# Patient Record
Sex: Female | Born: 1990 | Race: White | Hispanic: No | Marital: Married | Smoking: Never smoker
Health system: Southern US, Community
[De-identification: ages and names within clinical notes are randomized; demographics above are authoritative.]

## PROBLEM LIST (undated history)

## (undated) ENCOUNTER — Inpatient Hospital Stay: Payer: Self-pay

## (undated) ENCOUNTER — Inpatient Hospital Stay: Admission: RE | Payer: Self-pay | Source: Home / Self Care

## (undated) DIAGNOSIS — F419 Anxiety disorder, unspecified: Secondary | ICD-10-CM

---
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
## 2010-06-29 ENCOUNTER — Emergency Department: Payer: Self-pay | Admitting: Emergency Medicine

## 2010-06-30 ENCOUNTER — Observation Stay: Payer: Self-pay | Admitting: Internal Medicine

## 2010-07-03 ENCOUNTER — Emergency Department (HOSPITAL_COMMUNITY)
Admission: EM | Admit: 2010-07-03 | Discharge: 2010-07-04 | Disposition: A | Discharge status: Home / Self Care | Payer: PRIVATE HEALTH INSURANCE | Attending: Emergency Medicine | Admitting: Emergency Medicine

## 2010-07-03 DIAGNOSIS — R072 Precordial pain: Secondary | ICD-10-CM | POA: Insufficient documentation

## 2010-07-03 DIAGNOSIS — Z79899 Other long term (current) drug therapy: Secondary | ICD-10-CM | POA: Insufficient documentation

## 2010-07-03 DIAGNOSIS — K209 Esophagitis, unspecified without bleeding: Secondary | ICD-10-CM | POA: Insufficient documentation

## 2010-07-04 ENCOUNTER — Encounter (HOSPITAL_COMMUNITY): Payer: Self-pay | Admitting: Radiology

## 2010-07-04 ENCOUNTER — Emergency Department (HOSPITAL_COMMUNITY): Payer: PRIVATE HEALTH INSURANCE

## 2010-07-04 LAB — POCT I-STAT, CHEM 8
Chloride: 100 mEq/L (ref 96–112)
Glucose, Bld: 158 mg/dL — ABNORMAL HIGH (ref 70–99)
Hemoglobin: 11.6 g/dL — ABNORMAL LOW (ref 12.0–15.0)
Potassium: 3.7 mEq/L (ref 3.5–5.1)
Sodium: 137 mEq/L (ref 135–145)
TCO2: 25 mmol/L (ref 0–100)

## 2010-07-04 MED ORDER — IOHEXOL 300 MG/ML  SOLN
100.0000 mL | Freq: Once | INTRAMUSCULAR | Status: AC | PRN
  Administered 2010-07-04: 100 mL via INTRAVENOUS

## 2013-01-19 ENCOUNTER — Ambulatory Visit: Payer: Self-pay | Admitting: General Practice

## 2015-01-29 LAB — OB RESULTS CONSOLE HEPATITIS B SURFACE ANTIGEN: Hepatitis B Surface Ag: NEGATIVE

## 2015-01-29 LAB — OB RESULTS CONSOLE RUBELLA ANTIBODY, IGM: RUBELLA: IMMUNE

## 2015-01-29 LAB — OB RESULTS CONSOLE HIV ANTIBODY (ROUTINE TESTING): HIV: NONREACTIVE

## 2015-01-29 LAB — OB RESULTS CONSOLE VARICELLA ZOSTER ANTIBODY, IGG: VARICELLA IGG: IMMUNE

## 2015-01-29 LAB — OB RESULTS CONSOLE RPR: RPR: NONREACTIVE

## 2015-01-30 DIAGNOSIS — O26893 Other specified pregnancy related conditions, third trimester: Secondary | ICD-10-CM | POA: Insufficient documentation

## 2015-01-30 DIAGNOSIS — Z6791 Unspecified blood type, Rh negative: Secondary | ICD-10-CM | POA: Insufficient documentation

## 2015-02-05 ENCOUNTER — Emergency Department
Admission: EM | Admit: 2015-02-05 | Discharge: 2015-02-06 | Disposition: A | Discharge status: Home / Self Care | Payer: PRIVATE HEALTH INSURANCE | Attending: Emergency Medicine | Admitting: Emergency Medicine

## 2015-02-05 ENCOUNTER — Emergency Department: Payer: PRIVATE HEALTH INSURANCE

## 2015-02-05 DIAGNOSIS — Z3A01 Less than 8 weeks gestation of pregnancy: Secondary | ICD-10-CM | POA: Diagnosis not present

## 2015-02-05 DIAGNOSIS — O200 Threatened abortion: Secondary | ICD-10-CM | POA: Insufficient documentation

## 2015-02-05 DIAGNOSIS — O209 Hemorrhage in early pregnancy, unspecified: Secondary | ICD-10-CM | POA: Diagnosis present

## 2015-02-05 LAB — URINALYSIS COMPLETE WITH MICROSCOPIC (ARMC ONLY)
BACTERIA UA: NONE SEEN
Bilirubin Urine: NEGATIVE
Glucose, UA: NEGATIVE mg/dL
Ketones, ur: NEGATIVE mg/dL
LEUKOCYTES UA: NEGATIVE
NITRITE: NEGATIVE
PH: 5 (ref 5.0–8.0)
PROTEIN: NEGATIVE mg/dL
SPECIFIC GRAVITY, URINE: 1.026 (ref 1.005–1.030)

## 2015-02-05 LAB — CBC
HEMATOCRIT: 39.4 % (ref 35.0–47.0)
HEMOGLOBIN: 13.9 g/dL (ref 12.0–16.0)
MCH: 31.4 pg (ref 26.0–34.0)
MCHC: 35.3 g/dL (ref 32.0–36.0)
MCV: 88.8 fL (ref 80.0–100.0)
Platelets: 227 10*3/uL (ref 150–440)
RBC: 4.43 MIL/uL (ref 3.80–5.20)
RDW: 11.8 % (ref 11.5–14.5)
WBC: 8 10*3/uL (ref 3.6–11.0)

## 2015-02-05 LAB — HCG, QUANTITATIVE, PREGNANCY: HCG, BETA CHAIN, QUANT, S: 20367 m[IU]/mL — AB (ref <5)

## 2015-02-05 LAB — ABO/RH: ABO/RH(D): O NEG

## 2015-02-05 MED ORDER — RHO D IMMUNE GLOBULIN 1500 UNIT/2ML IJ SOSY
300.0000 ug | PREFILLED_SYRINGE | Freq: Once | INTRAMUSCULAR | Status: AC
  Administered 2015-02-06: 300 ug via INTRAMUSCULAR
  Filled 2015-02-05: qty 2

## 2015-02-05 NOTE — ED Notes (Signed)
Pt in with co vaginal bleeding since today, pt is [redacted] weeks pregnant.

## 2015-02-05 NOTE — ED Provider Notes (Signed)
[facility] Emergency Department Provider Note  ____________________________________________  Time seen: 8:20 PM  I have reviewed the triage vital signs and the nursing notes.   HISTORY  Chief Complaint Vaginal Bleeding    HPI Danielle Wood is a 24 y.o. female who is [redacted] weeks pregnant with a positive home pregnancy test who reports pelvic pain and contractions as well as vaginal bleeding today. No chest pain shortness of breath fevers chills nausea vomiting diarrhea or dizziness. She has a first prenatal appointment in 4 weeks for an ultrasound.     No past medical history on file. None  There are no active problems to display for this patient.    No past surgical history on file. None  No current outpatient prescriptions on file. None. Taking prenatal vitamins  Allergies Review of patient's allergies indicates no known allergies.   No family history on file.  Social History Social History  Substance Use Topics  . Smoking status: Not on file  . Smokeless tobacco: Not on file  . Alcohol Use: Not on file   no tobacco alcohol or drug use  Review of Systems  Constitutional:   No fever or chills. No weight changes Eyes:   No blurry vision or double vision.  ENT:   No sore throat. Cardiovascular:   No chest pain. Respiratory:   No dyspnea or cough. Gastrointestinal:   Cramping pelvic pain. No vomiting or diarrhea No BRBPR or melena. Genitourinary:   Negative for dysuria, urinary retention, bloody urine, or difficulty urinating. Positive vaginal bleeding Musculoskeletal:   Negative for back pain. No joint swelling or pain. Skin:   Negative for rash. Neurological:   Negative for headaches, focal weakness or numbness. Psychiatric:  No anxiety or depression.   Endocrine:  No hot/cold intolerance, changes in energy, or sleep difficulty.  10-point ROS otherwise negative.  ____________________________________________   PHYSICAL  EXAM:  VITAL SIGNS: ED Triage Vitals  Enc Vitals Group     BP 02/05/15 1932 125/80 mmHg     Pulse Rate 02/05/15 1932 72     Resp 02/05/15 1932 18     Temp 02/05/15 1932 98.4 F (36.9 C)     Temp Source 02/05/15 1932 Oral     SpO2 02/05/15 1932 100 %     Weight 02/05/15 1932 185 lb (83.915 kg)     Height 02/05/15 1932  (1.753 m)     Head Cir --      Peak Flow --      Pain Score 02/05/15 1933 4     Pain Loc --      Pain Edu? --      Excl. in GC? --      Constitutional:   Alert and oriented. Well appearing and in no distress. Eyes:   No scleral icterus. No conjunctival pallor.  ENT   Head:   Normocephalic and atraumatic.   Gastrointestinal:   Soft and nontender. No distention. There is no CVA tenderness.  No rebound, rigidity, or guarding. Genitourinary:   deferred Musculoskeletal:   Nontender with normal range of motion in all extremities. No joint effusions.  No lower extremity tenderness.  No edema. Neurologic:   Normal speech and language.  CN 2-10 normal. Motor grossly intact. Normal gait. No gross focal neurologic deficits are appreciated.   Psychiatric:   Mood and affect are normal. Speech and behavior are normal. Patient exhibits appropriate insight and judgment.  ____________________________________________    LABS (pertinent positives/negatives) (all labs  ordered are listed, but only abnormal results are displayed) Labs Reviewed  HCG, QUANTITATIVE, PREGNANCY - Abnormal; Notable for the following:    hCG, Beta Chain, [place] Longest [address] (*)    All other components within normal limits  URINALYSIS COMPLETEWITH MICROSCOPIC ([facility] ONLY) - Abnormal; Notable for the following:    Color, Urine YELLOW (*)    APPearance CLEAR (*)    Hgb urine dipstick 3+ (*)    Squamous Epithelial / LPF 6-30 (*)    All other components within normal limits  CBC  FETAL SCREEN  TYPE AND SCREEN  RH IG WORKUP (INCLUDES ABO/RH)  ABO/RH    ____________________________________________   EKG    ____________________________________________    RADIOLOGY  Pelvic ultrasound reveals live IUP with estimated gestational age of [redacted] weeks and 5 days, heart rate of 88. Small subchorionic hemorrhage  ____________________________________________   PROCEDURES   ____________________________________________   INITIAL IMPRESSION / ASSESSMENT AND PLAN / ED COURSE  Pertinent labs & imaging results that were available during my care of the patient were reviewed by me and considered in my medical decision making (see chart for details).  Patient presents with vaginal bleeding in first trimester. Ultrasound reveals a live IUP. Patient counseled on threatened miscarriage and to follow up in 2-3 days with [facility] clinic or the emergency department for repeat blood draw. No other significant symptoms, no evidence of PE or cardiomyopathy or DVT or infection. No bacteriuria. Patient reports that her blood type is O-, so we have a type and screen and fetal blood screen in process. Pending the outcome of this the patient will be given RhoGAM if she is Rh- confirmed, and then discharged home.     ____________________________________________   FINAL CLINICAL IMPRESSION(S) / ED DIAGNOSES  Final diagnoses:  Threatened miscarriage in early pregnancy      Sharman Cheek, MD 02/05/15 2247

## 2015-02-05 NOTE — Discharge Instructions (Signed)
Threatened Miscarriage A threatened miscarriage occurs when you have vaginal bleeding during your first 20 weeks of pregnancy but the pregnancy has not ended. If you have vaginal bleeding during this time, your health care provider will do tests to make sure you are still pregnant. If the tests show you are still pregnant and the developing baby (fetus) inside your womb (uterus) is still growing, your condition is considered a threatened miscarriage. A threatened miscarriage does not mean your pregnancy will end, but it does increase the risk of losing your pregnancy (complete miscarriage). CAUSES  The cause of a threatened miscarriage is usually not known. If you go on to have a complete miscarriage, the most common cause is an abnormal number of chromosomes in the developing baby. Chromosomes are the structures inside cells that hold all your genetic material. Some causes of vaginal bleeding that do not result in miscarriage include:  Having sex.  Having an infection.  Normal hormone changes of pregnancy.  Bleeding that occurs when an egg implants in your uterus. RISK FACTORS Risk factors for bleeding in early pregnancy include:  Obesity.  Smoking.  Drinking excessive amounts of alcohol or caffeine.  Recreational drug use. SIGNS AND SYMPTOMS  Light vaginal bleeding.  Mild abdominal pain or cramps. DIAGNOSIS  If you have bleeding with or without abdominal pain before 20 weeks of pregnancy, your health care provider will do tests to check whether you are still pregnant. One important test involves using sound waves and a computer (ultrasound) to create images of the inside of your uterus. Other tests include an internal exam of your vagina and uterus (pelvic exam) and measurement of your baby's heart rate.  You may be diagnosed with a threatened miscarriage if:  Ultrasound testing shows you are still pregnant.  Your baby's heart rate is strong.  A pelvic exam shows that the  opening between your uterus and your vagina (cervix) is closed.  Your heart rate and blood pressure are stable.  Blood tests confirm you are still pregnant. TREATMENT  No treatments have been shown to prevent a threatened miscarriage from going on to a complete miscarriage. However, the right home care is important.  HOME CARE INSTRUCTIONS   Make sure you keep all your appointments for prenatal care. This is very important.  Get plenty of rest.  Do not have sex or use tampons if you have vaginal bleeding.  Do not douche.  Do not smoke or use recreational drugs.  Do not drink alcohol.  Avoid caffeine. SEEK MEDICAL CARE IF:  You have light vaginal bleeding or spotting while pregnant.  You have abdominal pain or cramping.  You have a fever. SEEK IMMEDIATE MEDICAL CARE IF:  You have heavy vaginal bleeding.  You have blood clots coming from your vagina.  You have severe low back pain or abdominal cramps.  You have fever, chills, and severe abdominal pain. MAKE SURE YOU:  Understand these instructions.  Will watch your condition.  Will get help right away if you are not doing well or get worse. Document Released: 05/04/2005 Document Revised: 05/09/2013 Document Reviewed: 02/28/2013 [facility] Patient Information 2015 [place], [place]. This information is not intended to replace advice given to you by your health care provider. Make sure you discuss any questions you have with your health care provider.  Vaginal Bleeding During Pregnancy, First Trimester A small amount of bleeding (spotting) from the vagina is common in early pregnancy. Sometimes the bleeding is normal and is not a problem, and sometimes  it is a sign of something serious. Be sure to tell your doctor about any bleeding from your vagina right away. HOME CARE  Watch your condition for any changes.  Follow your doctor's instructions about how active you can be.  If you are on bed rest:  You may need to  stay in bed and only get up to use the bathroom.  You may be allowed to do some activities.  If you need help, make plans for someone to help you.  Write down:  The number of pads you use each day.  How often you change pads.  How soaked (saturated) your pads are.  Do not use tampons.  Do not douche.  Do not have sex or orgasms until your doctor says it is okay.  If you pass any tissue from your vagina, save the tissue so you can show it to your doctor.  Only take medicines as told by your doctor.  Do not take aspirin because it can make you bleed.  Keep all follow-up visits as told by your doctor. GET HELP IF:   You bleed from your vagina.  You have cramps.  You have labor pains.  You have a fever that does not go away after you take medicine. GET HELP RIGHT AWAY IF:   You have very bad cramps in your back or belly (abdomen).  You pass large clots or tissue from your vagina.  You bleed more.  You feel light-headed or weak.  You pass out (faint).  You have chills.  You are leaking fluid or have a gush of fluid from your vagina.  You pass out while pooping (having a bowel movement). MAKE SURE YOU:  Understand these instructions.  Will watch your condition.  Will get help right away if you are not doing well or get worse. Document Released: 09/18/2013 Document Reviewed: 01/09/2013 [facility] Patient Information 2015 [place]. This information is not intended to replace advice given to you by your health care provider. Make sure you discuss any questions you have with your health care provider.

## 2015-02-06 DIAGNOSIS — O200 Threatened abortion: Secondary | ICD-10-CM | POA: Diagnosis not present

## 2015-02-06 LAB — FETAL SCREEN: FETAL SCREEN: NEGATIVE

## 2015-02-06 NOTE — ED Notes (Signed)
esignature not working in room.  Pt signed and received instructions.  Family with pt.

## 2015-02-07 LAB — RH IG WORKUP (INCLUDES ABO/RH)
Gestational Age(Wks): 6
Unit division: 0

## 2015-03-02 LAB — TYPE AND SCREEN
ABO/RH(D): O NEG
Antibody Screen: NEGATIVE

## 2015-03-07 ENCOUNTER — Other Ambulatory Visit: Payer: Self-pay | Admitting: Obstetrics and Gynecology

## 2015-03-07 DIAGNOSIS — Z3491 Encounter for supervision of normal pregnancy, unspecified, first trimester: Secondary | ICD-10-CM

## 2015-03-28 ENCOUNTER — Ambulatory Visit
Admission: RE | Admit: 2015-03-28 | Discharge: 2015-03-28 | Disposition: A | Discharge status: Home / Self Care | Payer: PRIVATE HEALTH INSURANCE | Source: Ambulatory Visit | Attending: Obstetrics and Gynecology | Admitting: Obstetrics and Gynecology

## 2015-03-28 ENCOUNTER — Ambulatory Visit (HOSPITAL_BASED_OUTPATIENT_CLINIC_OR_DEPARTMENT_OTHER)
Admission: RE | Admit: 2015-03-28 | Discharge: 2015-03-28 | Disposition: A | Discharge status: Home / Self Care | Payer: PRIVATE HEALTH INSURANCE | Source: Ambulatory Visit | Attending: Obstetrics and Gynecology | Admitting: Obstetrics and Gynecology

## 2015-03-28 DIAGNOSIS — Z3A13 13 weeks gestation of pregnancy: Secondary | ICD-10-CM | POA: Diagnosis not present

## 2015-03-28 DIAGNOSIS — Z3491 Encounter for supervision of normal pregnancy, unspecified, first trimester: Secondary | ICD-10-CM

## 2015-03-28 DIAGNOSIS — Z8279 Family history of other congenital malformations, deformations and chromosomal abnormalities: Secondary | ICD-10-CM

## 2015-03-28 DIAGNOSIS — Z3401 Encounter for supervision of normal first pregnancy, first trimester: Secondary | ICD-10-CM | POA: Diagnosis present

## 2015-03-28 NOTE — Progress Notes (Signed)
Referring physician:  [facility]  Length of Consultation: 45 minutes   Danielle Wood  was referred to [facility] for genetic counseling to review prenatal screening and testing options and to discuss her family history of trisomy 58.  This note summarizes the information we discussed.   We first obtained a detailed family history and pregnancy history.  The patient and her mother were here for the visit.  This is the first pregnancy for Danielle Wood.  She reported no complications or exposures that would be expected to increase the risk for birth defects.  She stated that she has one brother who is in good health.  Her mother also had a son who passed away soon after birth with Trisomy 83.  The remainder of the family history was reported to be unremarkable for birth defects, mental retardation, recurrent pregnancy loss or known chromosome abnormalities.   We reviewed that chromosomes are the inherited structures that contain our instructions for development (genes).  Each cell of our body normally has 46 chromosomes, matched up into 23 pairs.  The last pair determines our gender and are called the sex chromosomes.  A female has an X and a Y chromosome, while a female has two X chromosomes.  Rarely, when a mother's egg and father's sperm unite, an extra or missing chromosome can be passed on to the baby by mistake.  Changes in the number or the structure of the chromosomes may result in a child with some degree of mental retardation and physical problems.  Trisomy 13 is caused by having three copies (instead of the usual two copies) of the genes on chromosome number 13.  There are two ways that trisomy 13 occur.  Most often (>95% of the time), Trisomy 13 is caused by an entire third copy of chromosome 13.  In the other cases, the condition is caused by a rearrangement of the chromosomes, known as a translocation.  We offered to review medical records on her brother to confirm the  chromosomal information.  Ms. Rennie Plowman mother indicated that she recalled it being the type that happened by chance and that she had amniocentesis in her pregnancy with our patient, which was normal.  We would expect if this was reported as normal, then Danielle Wood does not carry a translocation that would put this pregnancy at increased risk for a chromosome condition, but we are happy to review medical records if desired.  If her brother had the trisomy type, then we do not expect this pregnancy to be at increased risk.  We offered the follow screening tests for chromosome conditions in this pregnancy:  First trimester screening, which includes nuchal translucency ultrasound screen and first trimester maternal serum marker screening.  The nuchal translucency has approximately an 80% detection rate for Down syndrome and can be positive for other chromosome abnormalities as well as congenital heart defects.  When combined with a maternal serum marker screening, the detection rate is up to 90% for Down syndrome and up to 97% for trisomy 18.     Maternal serum marker screening, a blood test that measures pregnancy proteins, can provide risk assessments for Down syndrome, trisomy 18, and open neural tube defects (spina bifida, anencephaly). Because it does not directly examine the fetus, it cannot positively diagnose or rule out these problems.  Targeted ultrasound uses high frequency sound waves to create an image of the developing fetus.  An ultrasound is often recommended as a routine means of  evaluating the pregnancy.  It is also used to screen for fetal anatomy problems (for example, a heart defect) that might be suggestive of a chromosomal or other abnormality.   Should these screening tests indicate an increased concern, then the following additional testing options would be offered:  The chorionic villus sampling procedure is available for first trimester chromosome analysis.  This involves the  withdrawal of a small amount of chorionic villi (tissue from the developing placenta).  Risk of pregnancy loss is estimated to be approximately 1 in 200 to 1 in 100 (0.5 to 1%).  There is approximately a 1% (1 in 100) chance that the CVS chromosome results will be unclear.  Chorionic villi cannot be tested for neural tube defects.     Amniocentesis involves the removal of a small amount of amniotic fluid from the sac surrounding the fetus with the use of a thin needle inserted through the maternal abdomen and uterus.  Ultrasound guidance is used throughout the procedure.  Fetal cells from amniotic fluid are directly evaluated and > 99.5% of chromosome problems and > 98% of open neural tube defects can be detected. This procedure is generally performed after the 15th week of pregnancy.  The main risks to this procedure include complications leading to miscarriage in less than 1 in 200 cases (0.5%).  As another option for information if the pregnancy is suspected to be an an increased chance for certain chromosome conditions, we also reviewed the availability of cell free fetal DNA testing from maternal blood to determine whether or not the baby may have either Down syndrome, trisomy 4313, or trisomy 7018.  This test utilizes a maternal blood sample and DNA sequencing technology to isolate circulating cell free fetal DNA from maternal plasma.  The fetal DNA can then be analyzed for DNA sequences that are derived from the three most common chromosomes involved in aneuploidy, chromosomes 13, 18, and 21.  If the overall amount of DNA is greater than the expected level for any of these chromosomes, aneuploidy is suspected.  While we do not consider it a replacement for invasive testing and karyotype analysis, a negative result from this testing would be reassuring, though not a guarantee of a normal chromosome complement for the baby.  An abnormal result is certainly suggestive of an abnormal chromosome complement, though  we would still recommend CVS or amniocentesis to confirm any findings from this testing.  Cystic Fibrosis screening was also discussed with the patient. Cystic fibrosis (CF) is one of the most common genetic conditions in persons of Caucasian ancestry.  This condition occurs in approximately 1 in 2,500 Caucasian persons and results in thickened secretions in the lungs, digestive, and reproductive systems.  For a baby to be at risk for having CF, both of the parents must be carriers for this condition.  Approximately 1 in 6125 Caucasian persons is a carrier for CF.  Current carrier testing looks for the most common mutations in the gene for CF and can detect approximately 90% of carriers in the Caucasian population.  This means that the carrier screening can greatly reduce, but cannot eliminate, the chance for an individual to have a child with CF.  If an individual is found to be a carrier for CF, then carrier testing would be available for the partner. As part of [place] newborn screening profile, all babies born in the [place] of [place] will have a two-tier screening process.  Specimens are first tested to determine the concentration of  immunoreactive trypsinogen (IRT).  The top 5% of specimens with the highest IRT values then undergo DNA testing using a panel of over 40 common CF mutations.   After consideration of the options, Danielle Wood elected to proceed with first trimester screening today and to decline CF carrier screening.  We will contact her with results as soon as they become available.  An ultrasound was performed at the time of the visit.  The gestational age was consistent with  13 weeks.  Fetal anatomy could not be assessed due to early gestational age.  Please refer to the ultrasound report for details of that study.  Danielle Wood was encouraged to call with questions or concerns.  We can be contacted at [phone].   Danielle Anderson, MS, CGC

## 2015-03-28 NOTE — Progress Notes (Signed)
I saw the pt   With the genetic counselor . She had a sibling born with Tri 3413 - she desired first trimester screen  This was done today. Jimmey RalphLivingston, Devyon Keator MD

## 2015-04-01 ENCOUNTER — Telehealth: Payer: Self-pay | Admitting: Obstetrics and Gynecology

## 2015-04-01 NOTE — Telephone Encounter (Signed)
  [name] elected to undergo First Trimester screening on 03/28/2015.  To review, first trimester screening, includes nuchal translucency ultrasound screen and/or first trimester maternal serum marker screening.  The nuchal translucency has approximately an 80% detection rate for Down syndrome and can be positive for other chromosome abnormalities as well as heart defects.  When combined with a maternal serum marker screening, the detection rate is up to 90% for Down syndrome and up to 97% for trisomy 13 and 18.     The results of the First Trimester Nuchal Translucency and Biochemical Screening were within normal range.  The risk for Down syndrome is now estimated to be less than 1 in 10,000.  The risk for Trisomy 13/18 is also estimated to be less than 1 in 10,000.  Should more definitive information be desired, we would offer amniocentesis.  Because we do not yet know the effectiveness of combined first and second trimester screening, we do not recommend a maternal serum screen to assess the chance for chromosome conditions.  However, if screening for neural tube defects is desired, maternal serum screening for AFP only can be performed between 15 and [redacted] weeks gestation.

## 2015-05-19 NOTE — L&D Delivery Note (Signed)
Delivery Note At 12:12 AM a viable female was delivered via Vaginal, Spontaneous Delivery (Presentation: Middle Occiput Anterior).  APGAR: 9, 9; weight 6 lb 10.9 oz (3030 g).   Placenta status: intact, Spontaneous.  Cord: 3 vessels with the following complications: none  Anesthesia: Epidural  Episiotomy: Right Mediolateral Lacerations:  Right periurethral Suture Repair: 2.0 3.0 vicryl Est. Blood Loss (mL): 100  Mom to postpartum.  Baby to Couplet care / Skin to Skin.  Danielle Wood 10/03/2015, 1:10 AM

## 2015-06-06 ENCOUNTER — Observation Stay
Admission: EM | Admit: 2015-06-06 | Discharge: 2015-06-06 | Disposition: A | Discharge status: Home / Self Care | Payer: Managed Care, Other (non HMO) | Attending: Obstetrics and Gynecology | Admitting: Obstetrics and Gynecology

## 2015-06-06 ENCOUNTER — Observation Stay: Payer: Managed Care, Other (non HMO)

## 2015-06-06 DIAGNOSIS — O4692 Antepartum hemorrhage, unspecified, second trimester: Principal | ICD-10-CM | POA: Insufficient documentation

## 2015-06-06 DIAGNOSIS — Z3A23 23 weeks gestation of pregnancy: Secondary | ICD-10-CM | POA: Insufficient documentation

## 2015-06-06 DIAGNOSIS — O4690 Antepartum hemorrhage, unspecified, unspecified trimester: Secondary | ICD-10-CM | POA: Diagnosis present

## 2015-06-06 NOTE — OB Triage Provider Note (Signed)
25 yo G1P0 with LMP of 12/26/14 & EDD of 10/02/15 here today from [facility] for vaginal bleeding x 3 days. Initially started as spotting and today she passed a quarter sized blood clot. She was crying initially at the office as her and the FOB had a fight. He arrived and they were having an argument and he left upset. Patient did not want him with her. She was sent to hospital for monitoring and [place]. Past Medical History  Diagnosis Date  . Medical history non-contributory    Past Surgical History  Procedure Laterality Date  . No past surgeries    No family history on file.  Social History   Social History  . Marital Status: Single    Spouse Name: N/A  . Number of Children: N/A  . Years of Education: N/A   Occupational History  . Not on file.   Social History Main Topics  . Smoking status: Never Smoker   . Smokeless tobacco: Never Used  . Alcohol Use: No  . Drug Use: No  . Sexual Activity: Not Currently   Other Topics Concern  . Not on file   Social History Narrative  Review of Systems -+ vaginal spotting and bleeding, Benign x 9. Gen: 25 you white female in NAD. HEENT: Eyes: non-icteric, PERL, normocephalic.  Lungs: CTA bilat, no W/R/R. Heart: S1S2, RRR, No M/R/G. Abd: Gravid Vag Exam: Ext os 0.5 cm/long/? Presenting part UC: none on monitor FHT: Cat 1, 150. Age appropriate. [place]: no abruption or previa noted. Normal fluid.  VSS, Afebrile A: IUP at 22 weeks 2. Vaginal Bleeding of unknown cause P: Pelvic rest 2. Work restrictions 3. FU next week with [facility]

## 2015-06-06 NOTE — MAU Provider Note (Signed)
TRIAGE VISIT with NST  Vaginal Bleeding x 3 days  Subjective:   Danielle Wood is a 25 y.o. female. She is at [redacted]w[redacted]d gestation. She has noted spotting x 2 days very light and today passed a quarter sized clot and was seen at the office with CJones, CNM and evaluated. Pt was sent here to observe and also monitor and do an [place].  Her pregnancy has been complicated by:2nd trimester bleeding vaginally with no known cause.   PMH, PSH, POBH and problem list reviewed Medications and allergies reviewed.    Objective:   General appearance: alert, well appearing, and in no distress. External fetal monitoring: reactive No results found for this or any previous visit (from the past 48 hour(s)).   Assessment:   Pregnancy at [redacted]w[redacted]d with concerns for decreased fetal movement. Evaluation reveals:reactive NST with 2 accels 15 x 15 BPM   Plan:  NST for decreased FM, Fetus is moving and pt notes the FM now Orders placed: Orders Placed This Encounter  Procedures  . US OB Limited    Standing Status: Standing     Number of Occurrences: 1     Standing Expiration Date:     Order Specific Question:  What location should the exam be performed?    Answer:  [facility]    Order Specific Question:  Symptom/Reason for Exam    Answer:  Vaginal bleeding during pregnancy, antepartum [1610960]  . Diet clear liquid Room service appropriate?: Yes; Fluid consistency:: Thin; Fluid restriction:: 2000 mL Fluid    Standing Status: Standing     Number of Occurrences: 1     Standing Expiration Date:     Order Specific Question:  Room service appropriate?    Answer:  Yes    Order Specific Question:  Fluid consistency:    Answer:  Thin    Order Specific Question:  Fluid restriction:    Answer:  2000 mL Fluid  . Vitals signs per unit policy    Standing Status: Standing     Number of Occurrences: 1     Standing Expiration Date:   . Notify Physician    Standing Status: Standing     Number of Occurrences: 1     Standing Expiration Date:     Order Specific Question:  Notify Physician    Answer:  for vaginal bleeding    Order Specific Question:  Notify Physician    Answer:  for acute abdominal pain    Order Specific Question:  Notify Physician    Answer:  for temperature >/= 100.4 F    Order Specific Question:  Notify Physician    Answer:  for significant change in vital signs    Order Specific Question:  Notify Physician    Answer:  for non-reassuring fetal heart rate pattern    Order Specific Question:  Notify Physician    Answer:  for imminent delivery or failure to progress  . Fetal monitoring per unit policy    Standing Status: Standing     Number of Occurrences: 1     Standing Expiration Date:   . Activity as tolerated    Standing Status: Standing     Number of Occurrences: 1     Standing Expiration Date:   . Full code    Standing Status: Standing     Number of Occurrences: 1     Standing Expiration Date:   . Place in observation (patient's expected length of stay will be less than 2 midnights)  Standing Status: Standing     Number of Occurrences: 1     Standing Expiration Date:     Order Specific Question:  Hospital Area    Answer:  [facility] [[id number]]    Order Specific Question:  Diagnosis    Answer:  Vaginal bleeding during pregnancy, antepartum [1610960]    Order Specific Question:  Level of Care    Answer:  BIRTHING SUITE [17]    Order Specific Question:  Admitting Physician    Answer:  Darrell Jewel    Order Specific Question:  Attending Physician    Answer:  Darrell Jewel    Order Specific Question:  PT Class (Do Not Modify)    Answer:  Observation [104]    Order Specific Question:  PT Acc Code (Do Not Modify)    Answer:  Observation [10022]    Patient expresses understanding of information provided and plan of care.

## 2015-06-06 NOTE — Progress Notes (Signed)
Received report and assumed care from Leim Fabry, RN.  Introduced myself to pt.

## 2015-06-06 NOTE — Discharge Instructions (Signed)
Pelvic Rest °Pelvic rest is sometimes recommended for women when:  °· The placenta is partially or completely covering the opening of the cervix (placenta previa). °· There is bleeding between the uterine wall and the amniotic sac in the first trimester (subchorionic hemorrhage). °· The cervix begins to open without labor starting (incompetent cervix, cervical insufficiency). °· The labor is too early (preterm labor). °HOME CARE INSTRUCTIONS °· Do not have sexual intercourse, stimulation, or an orgasm. °· Do not use tampons, douche, or put anything in the vagina. °· Do not lift anything over 10 pounds (4.5 kg). °· Avoid strenuous activity or straining your pelvic muscles. °SEEK MEDICAL CARE IF:  °· You have any vaginal bleeding during pregnancy. Treat this as a potential emergency. °· You have cramping pain felt low in the stomach (stronger than menstrual cramps). °· You notice vaginal discharge (watery, mucus, or bloody). °· You have a low, dull backache. °· There are regular contractions or uterine tightening. °SEEK IMMEDIATE MEDICAL CARE IF: °You have vaginal bleeding and have placenta previa.  °  °This information is not intended to replace advice given to you by your health care provider. Make sure you discuss any questions you have with your health care provider. °  °Document Released: 08/29/2010 Document Revised: 07/27/2011 Document Reviewed: 11/05/2014 °Elsevier Interactive Patient Education ©2016 [employer] ° °

## 2015-06-06 NOTE — Discharge Summary (Signed)
Patient given discharge instructions on pelvic rest and vaginal bleeding during delivery. Patient given follow up appointment and given work note. Patient ambulatory at discharge with steady gait, in stable condition.

## 2015-06-06 NOTE — Progress Notes (Signed)
Pt up to wheelchair and to ultrasound.

## 2015-06-06 NOTE — Plan of Care (Signed)
G1 P0 pt arrived to [facility] with complaint of spotting x 3 days. Pt has hx of subchorionic hemorrage with early pregnancy. She states she has passed several small clots (quarter size) and bright red to orangish red.  States she has felt small amount of pressure and was told to be on monitor per CNM to see if any uc's seen on tracing.  Pt was checked per CNM in office and was 0.5 outer os and 0 inner os. Blood was seen on CNM glove in office. Ellison Carwin RNC

## 2015-07-15 ENCOUNTER — Observation Stay
Admission: EM | Admit: 2015-07-15 | Discharge: 2015-07-15 | Disposition: A | Discharge status: Home / Self Care | Payer: Managed Care, Other (non HMO) | Attending: Obstetrics and Gynecology | Admitting: Obstetrics and Gynecology

## 2015-07-15 DIAGNOSIS — R112 Nausea with vomiting, unspecified: Secondary | ICD-10-CM | POA: Diagnosis not present

## 2015-07-15 DIAGNOSIS — O26893 Other specified pregnancy related conditions, third trimester: Principal | ICD-10-CM | POA: Insufficient documentation

## 2015-07-15 DIAGNOSIS — Z3A28 28 weeks gestation of pregnancy: Secondary | ICD-10-CM | POA: Diagnosis not present

## 2015-07-15 DIAGNOSIS — R197 Diarrhea, unspecified: Secondary | ICD-10-CM | POA: Diagnosis not present

## 2015-07-15 DIAGNOSIS — R102 Pelvic and perineal pain: Secondary | ICD-10-CM | POA: Insufficient documentation

## 2015-07-15 LAB — URINALYSIS COMPLETE WITH MICROSCOPIC (ARMC ONLY)
BACTERIA UA: NONE SEEN
Bilirubin Urine: NEGATIVE
Glucose, UA: NEGATIVE mg/dL
Nitrite: NEGATIVE
PH: 6 (ref 5.0–8.0)
PROTEIN: 30 mg/dL — AB
Specific Gravity, Urine: 1.024 (ref 1.005–1.030)

## 2015-07-15 LAB — COMPREHENSIVE METABOLIC PANEL
ALBUMIN: 3.3 g/dL — AB (ref 3.5–5.0)
ALT: 17 U/L (ref 14–54)
ANION GAP: 9 (ref 5–15)
AST: 21 U/L (ref 15–41)
Alkaline Phosphatase: 65 U/L (ref 38–126)
BUN: 9 mg/dL (ref 6–20)
CHLORIDE: 106 mmol/L (ref 101–111)
CO2: 22 mmol/L (ref 22–32)
Calcium: 8.7 mg/dL — ABNORMAL LOW (ref 8.9–10.3)
Creatinine, Ser: 0.6 mg/dL (ref 0.44–1.00)
GFR calc Af Amer: >60 mL/min (ref >60)
GFR calc non Af Amer: >60 mL/min (ref >60)
GLUCOSE: 90 mg/dL (ref 65–99)
POTASSIUM: 4 mmol/L (ref 3.5–5.1)
SODIUM: 137 mmol/L (ref 135–145)
Total Bilirubin: 1 mg/dL (ref 0.3–1.2)
Total Protein: 7.1 g/dL (ref 6.5–8.1)

## 2015-07-15 LAB — CBC
HEMATOCRIT: 37.6 % (ref 35.0–47.0)
HEMOGLOBIN: 13.2 g/dL (ref 12.0–16.0)
MCH: 31.5 pg (ref 26.0–34.0)
MCHC: 35.1 g/dL (ref 32.0–36.0)
MCV: 89.8 fL (ref 80.0–100.0)
Platelets: 210 10*3/uL (ref 150–440)
RBC: 4.19 MIL/uL (ref 3.80–5.20)
RDW: 13.1 % (ref 11.5–14.5)
WBC: 10 10*3/uL (ref 3.6–11.0)

## 2015-07-15 LAB — CHLAMYDIA/NGC RT PCR (ARMC ONLY)
Chlamydia Tr: NOT DETECTED
N GONORRHOEAE: NOT DETECTED

## 2015-07-15 MED ORDER — CALCIUM CARBONATE ANTACID 500 MG PO CHEW: 2.0000 | CHEWABLE_TABLET | ORAL | Status: DC | PRN

## 2015-07-15 MED ORDER — LACTATED RINGERS IV BOLUS (SEPSIS)
1000.0000 mL | Freq: Once | INTRAVENOUS | Status: AC
  Administered 2015-07-15: 1000 mL via INTRAVENOUS

## 2015-07-15 MED ORDER — PROMETHAZINE HCL 12.5 MG RE SUPP
12.5000 mg | Freq: Four times a day (QID) | RECTAL | Status: DC | PRN
Start: 2015-07-15 — End: 2015-10-04

## 2015-07-15 MED ORDER — OXYTOCIN 40 UNITS IN LACTATED RINGERS INFUSION - SIMPLE MED: 1.0000 m[IU]/min | INTRAVENOUS | Status: DC

## 2015-07-15 MED ORDER — ACETAMINOPHEN 325 MG PO TABS
650.0000 mg | ORAL_TABLET | ORAL | Status: DC | PRN
  Administered 2015-07-15: 650 mg via ORAL
  Filled 2015-07-15: qty 2

## 2015-07-15 MED ORDER — SODIUM CHLORIDE FLUSH 0.9 % IV SOLN
INTRAVENOUS | Status: AC
  Filled 2015-07-15: qty 20

## 2015-07-15 MED ORDER — ONDANSETRON 4 MG PO TBDP: 4.0000 mg | ORAL_TABLET | Freq: Four times a day (QID) | ORAL | Status: DC | PRN

## 2015-07-15 NOTE — OB Triage Note (Signed)
Patient comes in with complaint of abdominal cramping, nausea, vomiting, and diarrhea since two this afternoon. Patient state she has not been able to keep anything down. Patient denies any vaginal bleeding or discharge. Patient vital sign stable and patient afebrile. Mother at bedside. Will continue to monitor.

## 2015-07-15 NOTE — OB Triage Provider Note (Signed)
Triage visit with NST   Danielle Wood is a 25 y.o. G1P0. She is at [redacted]w[redacted]d gestation. She presents with nausea/vomiting and sharp abdominal pain and pelvic pressure  Indication: Pelvic pressure and pain  S: Resting comfortably. no CTX, no VB. Active fetal movement O:  BP 119/71 mmHg  Pulse 104  Temp(Src) 98.2 F (36.8 C) (Oral)  Resp 19  Ht  (1.753 m)  Wt 83.915 kg (185 lb)  BMI 27.31 kg/m2  LMP 12/26/2014 No results found for this or any previous visit (from the past 48 hour(s)).   Gen: NAD, AAOx3      Abd: FNTTP      Ext: Non-tender, Nonedmeatous    FHT: 155, mod var, +accels no decels - appropriate for gestational age TOCO: quiet SVE: Dilation: Closed Exam by:: Virgel Manifold, MD   A/P:  25 y.o. G1P0 [redacted]w[redacted]d with n/v, likely viral enteritis.    CMP, CBC ordered. 1L bolus iv fluids given. Antiemetics sent to pharmacy  Reactive NST, with moderate variability and accelerations, no decels  Fetal Wellbeing: Reassuring  Cervix closed - no evidence of preterm labor  D/c home stable, precautions reviewed, follow-up as scheduled.

## 2015-09-04 LAB — OB RESULTS CONSOLE GBS: GBS: NEGATIVE

## 2015-09-04 LAB — OB RESULTS CONSOLE GC/CHLAMYDIA
CHLAMYDIA, DNA PROBE: NEGATIVE
Gonorrhea: NEGATIVE

## 2015-10-02 ENCOUNTER — Encounter: Payer: Self-pay | Admitting: Obstetrics and Gynecology

## 2015-10-02 ENCOUNTER — Inpatient Hospital Stay: Payer: Managed Care, Other (non HMO) | Admitting: Anesthesiology

## 2015-10-02 ENCOUNTER — Inpatient Hospital Stay
Admission: RE | Admit: 2015-10-02 | Discharge: 2015-10-04 | DRG: 775 | Disposition: A | Discharge status: Home / Self Care | Payer: Managed Care, Other (non HMO) | Attending: Obstetrics and Gynecology | Admitting: Obstetrics and Gynecology

## 2015-10-02 ENCOUNTER — Other Ambulatory Visit: Payer: Self-pay | Admitting: Obstetrics and Gynecology

## 2015-10-02 DIAGNOSIS — Z8279 Family history of other congenital malformations, deformations and chromosomal abnormalities: Secondary | ICD-10-CM

## 2015-10-02 DIAGNOSIS — Z3A40 40 weeks gestation of pregnancy: Secondary | ICD-10-CM | POA: Diagnosis not present

## 2015-10-02 LAB — TYPE AND SCREEN
ABO/RH(D): O NEG
ANTIBODY SCREEN: NEGATIVE

## 2015-10-02 LAB — CBC
HEMATOCRIT: 33.9 % — AB (ref 35.0–47.0)
HEMATOCRIT: 36.3 % (ref 35.0–47.0)
HEMOGLOBIN: 12 g/dL (ref 12.0–16.0)
HEMOGLOBIN: 12.3 g/dL (ref 12.0–16.0)
MCH: 31.8 pg (ref 26.0–34.0)
MCH: 31.2 pg (ref 26.0–34.0)
MCHC: 35.4 g/dL (ref 32.0–36.0)
MCHC: 34 g/dL (ref 32.0–36.0)
MCV: 90 fL (ref 80.0–100.0)
MCV: 91.8 fL (ref 80.0–100.0)
Platelets: 182 10*3/uL (ref 150–440)
Platelets: 176 10*3/uL (ref 150–440)
RBC: 3.77 MIL/uL — ABNORMAL LOW (ref 3.80–5.20)
RBC: 3.96 MIL/uL (ref 3.80–5.20)
RDW: 12.6 % (ref 11.5–14.5)
RDW: 12.6 % (ref 11.5–14.5)
WBC: 9.3 10*3/uL (ref 3.6–11.0)
WBC: 13 10*3/uL — AB (ref 3.6–11.0)

## 2015-10-02 LAB — COMPREHENSIVE METABOLIC PANEL
ALBUMIN: 2.9 g/dL — AB (ref 3.5–5.0)
ALK PHOS: 95 U/L (ref 38–126)
ALT: 15 U/L (ref 14–54)
ANION GAP: 9 (ref 5–15)
AST: 22 U/L (ref 15–41)
BILIRUBIN TOTAL: 0.5 mg/dL (ref 0.3–1.2)
BUN: 5 mg/dL — ABNORMAL LOW (ref 6–20)
CALCIUM: 8.5 mg/dL — AB (ref 8.9–10.3)
CO2: 21 mmol/L — ABNORMAL LOW (ref 22–32)
Chloride: 106 mmol/L (ref 101–111)
Creatinine, Ser: 0.58 mg/dL (ref 0.44–1.00)
Glucose, Bld: 99 mg/dL (ref 65–99)
POTASSIUM: 3.7 mmol/L (ref 3.5–5.1)
Sodium: 136 mmol/L (ref 135–145)
TOTAL PROTEIN: 6.1 g/dL — AB (ref 6.5–8.1)

## 2015-10-02 MED ORDER — FENTANYL 2.5 MCG/ML W/ROPIVACAINE 0.2% IN NS 100 ML EPIDURAL INFUSION (ARMC-ANES)
EPIDURAL | Status: AC
  Filled 2015-10-02: qty 100

## 2015-10-02 MED ORDER — LACTATED RINGERS IV SOLN
INTRAVENOUS | Status: DC
  Administered 2015-10-02: 11:00:00 via INTRAVENOUS

## 2015-10-02 MED ORDER — TERBUTALINE SULFATE 1 MG/ML IJ SOLN: 0.2500 mg | Freq: Once | INTRAMUSCULAR | Status: DC | PRN

## 2015-10-02 MED ORDER — LIDOCAINE HCL (PF) 1 % IJ SOLN
30.0000 mL | INTRAMUSCULAR | Status: DC | PRN
  Filled 2015-10-02: qty 30

## 2015-10-02 MED ORDER — BUTORPHANOL TARTRATE 1 MG/ML IJ SOLN
1.0000 mg | INTRAMUSCULAR | Status: DC | PRN
  Administered 2015-10-02: 1 mg via INTRAVENOUS
  Filled 2015-10-02: qty 1

## 2015-10-02 MED ORDER — OXYTOCIN 40 UNITS IN LACTATED RINGERS INFUSION - SIMPLE MED: 2.5000 [IU]/h | INTRAVENOUS | Status: DC

## 2015-10-02 MED ORDER — OXYTOCIN BOLUS FROM INFUSION: 500.0000 mL | INTRAVENOUS | Status: DC

## 2015-10-02 MED ORDER — SODIUM CHLORIDE 0.9 % IV SOLN
INTRAVENOUS | Status: DC | PRN
  Administered 2015-10-02 (×2): 5 mL via EPIDURAL

## 2015-10-02 MED ORDER — ACETAMINOPHEN 325 MG PO TABS
650.0000 mg | ORAL_TABLET | ORAL | Status: DC | PRN
Start: 2015-10-02 — End: 2015-10-03

## 2015-10-02 MED ORDER — FENTANYL 2.5 MCG/ML W/ROPIVACAINE 0.2% IN NS 100 ML EPIDURAL INFUSION (ARMC-ANES)
EPIDURAL | Status: AC
  Administered 2015-10-02: 21:00:00 via EPIDURAL
  Administered 2015-10-02: 10 mL/h via EPIDURAL
  Filled 2015-10-02: qty 100

## 2015-10-02 MED ORDER — SODIUM CHLORIDE 0.9 % IJ SOLN
INTRAMUSCULAR | Status: AC
  Filled 2015-10-02: qty 200

## 2015-10-02 MED ORDER — ONDANSETRON HCL 4 MG/2ML IJ SOLN: 4.0000 mg | Freq: Four times a day (QID) | INTRAMUSCULAR | Status: DC | PRN

## 2015-10-02 MED ORDER — LACTATED RINGERS IV SOLN: 500.0000 mL | INTRAVENOUS | Status: DC | PRN

## 2015-10-02 MED ORDER — MISOPROSTOL 200 MCG PO TABS
ORAL_TABLET | ORAL | Status: AC
  Filled 2015-10-02: qty 4

## 2015-10-02 MED ORDER — CITRIC ACID-SODIUM CITRATE 334-500 MG/5ML PO SOLN
30.0000 mL | ORAL | Status: DC | PRN
Start: 2015-10-02 — End: 2015-10-03

## 2015-10-02 MED ORDER — LIDOCAINE-EPINEPHRINE (PF) 1.5 %-1:200000 IJ SOLN
INTRAMUSCULAR | Status: DC | PRN
  Administered 2015-10-02: 3 mL via EPIDURAL

## 2015-10-02 MED ORDER — OXYTOCIN 40 UNITS IN LACTATED RINGERS INFUSION - SIMPLE MED
1.0000 m[IU]/min | INTRAVENOUS | Status: DC
  Administered 2015-10-02: 2 m[IU]/min via INTRAVENOUS
  Administered 2015-10-02: 1 m[IU]/min via INTRAVENOUS
  Filled 2015-10-02: qty 1000

## 2015-10-02 NOTE — Anesthesia Preprocedure Evaluation (Signed)
Anesthesia Evaluation  Patient identified by MRN, date of birth, ID band Patient awake    Reviewed: Allergy & Precautions, H&P , NPO status , Patient's Chart, lab work & pertinent test results, reviewed documented beta blocker date and time   History of Anesthesia Complications Negative for: history of anesthetic complications  Airway Mallampati: II  TM Distance: >3 FB Neck ROM: full    Dental no notable dental hx. (+) Teeth Intact   Pulmonary neg pulmonary ROS,    Pulmonary exam normal breath sounds clear to auscultation       Cardiovascular Exercise Tolerance: Good negative cardio ROS Normal cardiovascular exam Rhythm:regular Rate:Normal     Neuro/Psych negative neurological ROS  negative psych ROS   GI/Hepatic negative GI ROS, Neg liver ROS,   Endo/Other  negative endocrine ROS  Renal/GU negative Renal ROS  negative genitourinary   Musculoskeletal   Abdominal   Peds  Hematology negative hematology ROS (+)   Anesthesia Other Findings Past Medical History:   Medical history non-contributory                             Reproductive/Obstetrics (+) Pregnancy                             Anesthesia Physical Anesthesia Plan  ASA: II  Anesthesia Plan: Epidural   Post-op Pain Management:    Induction:   Airway Management Planned:   Additional Equipment:   Intra-op Plan:   Post-operative Plan:   Informed Consent: I have reviewed the patients History and Physical, chart, labs and discussed the procedure including the risks, benefits and alternatives for the proposed anesthesia with the patient or authorized representative who has indicated his/her understanding and acceptance.   Dental Advisory Given  Plan Discussed with: Anesthesiologist, CRNA and Surgeon  Anesthesia Plan Comments:         Anesthesia Quick Evaluation

## 2015-10-02 NOTE — Progress Notes (Signed)
MD Note:  Fully dilated at 10:18pm, -1 station. Labor down for 1 hour. Rechecked at 11pm and found to be +2/+3 station. ROA.  On O2, IVH, left lateral positioning  Not feeling pressure  O: Filed Vitals:   10/02/15 2054 10/02/15 2154  BP: 142/97 130/85  Pulse: 71 78  Temp: 98.5 F (36.9 C)   Resp: 22     EFM: 150 mod var, +accels with scalp stim, intermittent variable decelerations to nadir of 40 x 1 minute always with spontaneous recovery to baseline   Toco: Q713min  A/P: 24yo G1P0 @ 40+0wks undergoing elective IOL, cat 2 tracing with moderate variability which precludes metabolic acidemia at this time. Will continue pushing. Uterine resuscitation measures in place.  - anticipate NSVD  Ala DachJohanna K Conni Knighton, MD

## 2015-10-02 NOTE — Progress Notes (Signed)
IOL Day 1 - elective  S: Patient unable to get pain relief with epidural. Additional stadol just given. Pitocin shut off at 8:20pm for prolonged deceleration in setting of tachysystole. Still with tachysystole now, pit at rate of 2.   O: Filed Vitals:   10/02/15 2045 10/02/15 2054  BP: 144/95 142/97  Pulse: 78 71  Temp:  98.5 F (36.9 C)  Resp:  22   GEN: mild distress ABD: soft, NT EXT: wwp  SVE: 5.5/60/-3 at 8:20pm, IUPC in place  EFM: 140 mod var, no accels, intermittent variable decelerations to nadir of 60 - cat 2 Toco: tachysystole, but MVU 180  A/P: 24yo G1P0 @ 40+0wks undergoing elective IOL. Also w/ severe range BPs likely due to pain, no neuro sx of PEC. Awaiting labs to be sent. Though MVUs not adequate, we are adjusting her epidural to help with pain relief. Will turn off pitocin for another hour as the level adjusts. Will restart in 1 hour.  1. Severe range BPs - CMP, P:C ratio 2. Pain control - titrate epidural  3. Pitocin augmentation - restart in 1 hour to achieve adequate MVUs - recheck with 4 hours of adequate MVUs 4. Fetal well being - moderate variability precludes metabolic acidemia - cont to monitor  Anticipate NSVD  Ala DachJohanna K Burnice Oestreicher, MD

## 2015-10-02 NOTE — Progress Notes (Signed)
Foley bulb removed. Pitocin at 7mu, down from 11 for tachysystole. Pt still comfortable. Epidural or pain meds when desired.  Cat I strip with mod var, +accels, no decels. Toco- irritability SVE- 5/60/-3, well-applied and cephalic. Still midposition and moderately soft.  A/P: 24yo G1P0 at 40+0wks with elective IOL at term - s/p Foley bulb. Titrated pitocin - Fetal status reassuring with Cat I strip - Plan for AROM when patient finishes walking. - Pain meds PRN - Anticipate vaginal delivery

## 2015-10-02 NOTE — H&P (Signed)
  OB ADMISSION/ HISTORY & PHYSICAL:  Admission Date: No admission date for patient encounter.  Admit Diagnosis: Elective IOL at 40 weeks  Danielle Wood is a 25 y.o. female presenting for elective induction at 40 weeks for advanced dilation.   Prenatal History: G1P0   EDC : 10/02/2015, by Last Menstrual Period  [facility]  Prenatal course complicated by Rh negative, Subchorionic hemorrhage in 1st trimester: [facility] - evaluate at complete anatomy US, Pt. Had an older brother with Down Syndrome that passed away at day 1 of life - genetic screening negative   Prenatal Labs: ABO, Rh:  O NEG  Antibody: NEG (09/20 2153) Rubella:   Immune Varicella: Immune RPR:   NR HBsAg:   Negative HIV:   Negative GTT: 91 GBS:   Negative   Medical / Surgical History :  Past medical history:  Past Medical History  Diagnosis Date  . Medical history non-contributory      Past surgical history:  Past Surgical History  Procedure Laterality Date  . No past surgeries      Family History: No family history on file.   Social History:  reports that she has never smoked. She has never used smokeless tobacco. She reports that she does not drink alcohol or use illicit drugs.   Allergies: Review of patient's allergies indicates no known allergies.    Current Medications at time of admission:  Prior to Admission medications   Medication Sig Start Date End Date Taking? Authorizing Provider  ondansetron (ZOFRAN ODT) 4 MG disintegrating tablet Take 1 tablet (4 mg total) by mouth every 6 (six) hours as needed for nausea. 07/15/15   Christeen DouglasBethany Beasley, MD  Prenatal Vit-Fe Fumarate-FA (PRENATAL MULTIVITAMIN) TABS tablet Take 1 tablet by mouth daily at 12 noon.    Historical Provider, MD  promethazine (PHENERGAN) 12.5 MG suppository Place 1 suppository (12.5 mg total) rectally every 6 (six) hours as needed for nausea or vomiting. 07/15/15   Christeen DouglasBethany Beasley, MD   promethazine (PHENERGAN) 25 MG suppository Place 25 mg rectally every 6 (six) hours as needed for nausea or vomiting.    Historical Provider, MD  promethazine (PHENERGAN) 25 MG tablet Take 25 mg by mouth every 6 (six) hours as needed for nausea or vomiting.    Historical Provider, MD     Review of Systems: Active FM Irregular contractions No LOF  / SROM bloody show present   Physical Exam:  VS: Last menstrual period 12/26/2014.  General: alert and oriented, appears calm Heart: RRR Lungs: Clear lung fields Abdomen: Gravid, soft and non-tender, non-distended / uterus: gravid, non-tender Extremities: +1 LE edema  Genitalia / VE:  3cm/60%/-2/vtx   Assessment: [redacted] weeks gestation Induction stage of labor RH Negative   Plan:  1. Elective IOL at 40 weeks   - Routine Labor and Delivery orders  - Begin Pitocin at 1 milliunit and increase by 2 milliunits   - May have Stadol 1mg  IVP every 1hour PRN pain  - May have epidural upon request 2. GBS Negative  - No prophylaxis needed 3. Contraception:  - OCPs 4. RH Negative   - S/P Rhogam on 02/05/16 and 07/04/15 5. Anticipate NSVD  Dr. Dalbert GarnetBeasley notified of admission / plan of care  Carlean JewsMeredith Sigmon, CNM

## 2015-10-02 NOTE — Progress Notes (Signed)
Pt still breathing and crying with contractions. Pt does have an anesthesia level at T12 but is still crying with contractions. Dr Karlton LemonKarenz called and informed of clinical data, and order recieved to increase epidural rate to 3420ml/hr for 1 hr.

## 2015-10-02 NOTE — Progress Notes (Signed)
Patient ID: Danielle BendersSamantha E Wood, female   DOB: 04/01/1991, 25 y.o.   MRN: [id number]  S: Pt not feeling contractions strongly, but is contracting with pitocin of 17mu. No headache, blurry vision or spots in vision, no epigastric pain.  O: Elevated BP. Max 157/97 at 18:30.   Filed Vitals:   10/02/15 1730 10/02/15 1835  BP: 133/94 152/97  Pulse: 95 86  Temp: 98 F (36.7 C)   Resp:      SVE: 5/60/-3, mid position.  FHT- Cat I strip with mod varin, +accels, no decels Toco- q2 min  A/P: 24yo G1P0 at 40+0wks for elective IOL with cervical dilation of 3cm at admission.  - PreE labs collected and sent. If severe features, will start magnesium with BP parameters of 160/110. - AROM now for clear fluid. IUPC placed. Monitor mvu. Titrate pitocin to adequate. - GBS neg - Epidural when desired. - Anticipate vaginal delivery

## 2015-10-02 NOTE — Progress Notes (Addendum)
Saddie BendersSamantha E Murray is a 25 y.o. G1P0 at 1964w0d admitted for induction of labor due to Elective at term.  Subjective: Feeling some crampiness. Pitocin at 847mu/min.   Objective: BP 143/95 mmHg  Pulse 70  Temp(Src) 98.2 F (36.8 C) (Oral)  Resp 18  Ht 5\' 9"  (1.753 m)  Wt 201 lb (91.173 kg)  BMI 29.67 kg/m2  LMP 12/26/2014      FHT:  FHR: 140 bpm, variability: moderate,  accelerations:  Present,  decelerations:  Absent UC:   irregular, every 10 minutes SVE:   Dilation: 3 Effacement (%): Thick Station: -2 Exam by:: [facility]  Labs: Lab Results  Component Value Date   WBC 9.3 10/02/2015   HGB 12.0 10/02/2015   HCT 33.9* 10/02/2015   MCV 90.0 10/02/2015   PLT 182 10/02/2015    Assessment / Plan: Induction of labor due to elective,  progressing on pitocin. Cook catheter placed with 80mL in each bulb.  Labor: Progressing on Pitocin, will continue to increase then AROM Fetal Wellbeing:  Category I Pain Control:  Labor support without medications Anticipated MOD:  NSVD  Reah Justo 10/02/2015, 1:46 PM

## 2015-10-02 NOTE — Anesthesia Procedure Notes (Signed)
Epidural Patient location during procedure: OB Start time: 10/02/2015 7:58 PM End time: 10/02/2015 8:07 PM  Staffing Anesthesiologist: Lenard SimmerKARENZ, Ramiah Helfrich Performed by: anesthesiologist   Preanesthetic Checklist Completed: patient identified, site marked, surgical consent, pre-op evaluation, timeout performed, IV checked, risks and benefits discussed and monitors and equipment checked  Epidural Patient position: sitting Prep: ChloraPrep Patient monitoring: heart rate, continuous pulse ox and blood pressure Approach: midline Location: L4-L5 Injection technique: LOR saline  Needle:  Needle type: Tuohy  Needle gauge: 17 G Needle length: 9 cm and 9 Needle insertion depth: 5 cm Catheter type: closed end flexible Catheter size: 19 Gauge Catheter at skin depth: 9.5 cm Test dose: negative and 1.5% lidocaine with Epi 1:200 K  Assessment Sensory level: T8 Events: blood not aspirated, injection not painful, no injection resistance, negative IV test and no paresthesia  Additional Notes   Patient tolerated the insertion well without immediate complications.Reason for block:procedure for pain

## 2015-10-02 NOTE — ED Notes (Signed)
Pt is here for a scheduled induction.  

## 2015-10-03 LAB — CBC
HCT: 36 % (ref 35.0–47.0)
Hemoglobin: 12.5 g/dL (ref 12.0–16.0)
MCH: 31.2 pg (ref 26.0–34.0)
MCHC: 34.7 g/dL (ref 32.0–36.0)
MCV: 89.9 fL (ref 80.0–100.0)
PLATELETS: 175 10*3/uL (ref 150–440)
RBC: 4 MIL/uL (ref 3.80–5.20)
RDW: 13.2 % (ref 11.5–14.5)
WBC: 15.2 10*3/uL — AB (ref 3.6–11.0)

## 2015-10-03 LAB — RPR: RPR Ser Ql: NONREACTIVE

## 2015-10-03 MED ORDER — ACETAMINOPHEN 325 MG PO TABS: 650.0000 mg | ORAL_TABLET | ORAL | Status: DC | PRN

## 2015-10-03 MED ORDER — SIMETHICONE 80 MG PO CHEW: 80.0000 mg | CHEWABLE_TABLET | ORAL | Status: DC | PRN

## 2015-10-03 MED ORDER — SODIUM CHLORIDE 0.9% FLUSH: 3.0000 mL | INTRAVENOUS | Status: DC | PRN

## 2015-10-03 MED ORDER — ONDANSETRON HCL 4 MG/2ML IJ SOLN: 4.0000 mg | INTRAMUSCULAR | Status: DC | PRN

## 2015-10-03 MED ORDER — OXYCODONE HCL 5 MG PO TABS
10.0000 mg | ORAL_TABLET | ORAL | Status: DC | PRN
  Administered 2015-10-03 – 2015-10-04 (×4): 10 mg via ORAL
  Filled 2015-10-03 (×4): qty 2

## 2015-10-03 MED ORDER — MEPERIDINE HCL 25 MG/ML IJ SOLN: 6.2500 mg | INTRAMUSCULAR | Status: DC | PRN

## 2015-10-03 MED ORDER — IBUPROFEN 600 MG PO TABS
600.0000 mg | ORAL_TABLET | Freq: Four times a day (QID) | ORAL | Status: DC
  Administered 2015-10-03 – 2015-10-04 (×5): 600 mg via ORAL
  Filled 2015-10-03 (×5): qty 1

## 2015-10-03 MED ORDER — DIPHENHYDRAMINE HCL 50 MG/ML IJ SOLN: 12.5000 mg | INTRAMUSCULAR | Status: DC | PRN

## 2015-10-03 MED ORDER — NALBUPHINE HCL 10 MG/ML IJ SOLN: 5.0000 mg | Freq: Once | INTRAMUSCULAR | Status: DC | PRN

## 2015-10-03 MED ORDER — TETANUS-DIPHTH-ACELL PERTUSSIS 5-2.5-18.5 LF-MCG/0.5 IM SUSP: 0.5000 mL | Freq: Once | INTRAMUSCULAR | Status: DC

## 2015-10-03 MED ORDER — ZOLPIDEM TARTRATE 5 MG PO TABS: 5.0000 mg | ORAL_TABLET | Freq: Every evening | ORAL | Status: DC | PRN

## 2015-10-03 MED ORDER — KETOROLAC TROMETHAMINE 30 MG/ML IJ SOLN: 30.0000 mg | Freq: Four times a day (QID) | INTRAMUSCULAR | Status: DC | PRN

## 2015-10-03 MED ORDER — FENTANYL 2.5 MCG/ML W/ROPIVACAINE 0.2% IN NS 100 ML EPIDURAL INFUSION (ARMC-ANES): 10.0000 mL/h | EPIDURAL | Status: DC

## 2015-10-03 MED ORDER — NALOXONE HCL 2 MG/2ML IJ SOSY: 1.0000 ug/kg/h | PREFILLED_SYRINGE | INTRAVENOUS | Status: DC | PRN

## 2015-10-03 MED ORDER — ONDANSETRON HCL 4 MG/2ML IJ SOLN: 4.0000 mg | Freq: Three times a day (TID) | INTRAMUSCULAR | Status: DC | PRN

## 2015-10-03 MED ORDER — COCONUT OIL OIL
1.0000 "application " | TOPICAL_OIL | Status: DC | PRN
  Administered 2015-10-03: 1 via TOPICAL
  Filled 2015-10-03 (×2): qty 120

## 2015-10-03 MED ORDER — BENZOCAINE-MENTHOL 20-0.5 % EX AERO
1.0000 "application " | INHALATION_SPRAY | CUTANEOUS | Status: DC | PRN
  Administered 2015-10-03: 1 via TOPICAL
  Filled 2015-10-03 (×2): qty 56

## 2015-10-03 MED ORDER — ONDANSETRON HCL 4 MG PO TABS: 4.0000 mg | ORAL_TABLET | ORAL | Status: DC | PRN

## 2015-10-03 MED ORDER — NALBUPHINE HCL 10 MG/ML IJ SOLN: 5.0000 mg | INTRAMUSCULAR | Status: DC | PRN

## 2015-10-03 MED ORDER — OXYCODONE HCL 5 MG PO TABS: 5.0000 mg | ORAL_TABLET | ORAL | Status: DC | PRN

## 2015-10-03 MED ORDER — DIPHENHYDRAMINE HCL 25 MG PO CAPS: 25.0000 mg | ORAL_CAPSULE | Freq: Four times a day (QID) | ORAL | Status: DC | PRN

## 2015-10-03 MED ORDER — NALOXONE HCL 0.4 MG/ML IJ SOLN: 0.4000 mg | INTRAMUSCULAR | Status: DC | PRN

## 2015-10-03 MED ORDER — DIBUCAINE 1 % RE OINT: 1.0000 "application " | TOPICAL_OINTMENT | RECTAL | Status: DC | PRN

## 2015-10-03 MED ORDER — SODIUM CHLORIDE 0.9% FLUSH: 3.0000 mL | Freq: Two times a day (BID) | INTRAVENOUS | Status: DC

## 2015-10-03 MED ORDER — PRENATAL MULTIVITAMIN CH
1.0000 | ORAL_TABLET | Freq: Every day | ORAL | Status: DC
  Administered 2015-10-03 – 2015-10-04 (×2): 1 via ORAL
  Filled 2015-10-03 (×2): qty 1

## 2015-10-03 MED ORDER — WITCH HAZEL-GLYCERIN EX PADS
1.0000 | MEDICATED_PAD | CUTANEOUS | Status: DC | PRN
Start: 2015-10-03 — End: 2015-10-04

## 2015-10-03 MED ORDER — SODIUM CHLORIDE 0.9 % IV SOLN: 250.0000 mL | INTRAVENOUS | Status: DC | PRN

## 2015-10-03 MED ORDER — DIPHENHYDRAMINE HCL 25 MG PO CAPS: 25.0000 mg | ORAL_CAPSULE | ORAL | Status: DC | PRN

## 2015-10-03 MED ORDER — SENNOSIDES-DOCUSATE SODIUM 8.6-50 MG PO TABS
2.0000 | ORAL_TABLET | ORAL | Status: DC
  Administered 2015-10-03: 2 via ORAL
  Filled 2015-10-03: qty 2

## 2015-10-03 NOTE — Progress Notes (Signed)
Post Partum Day Del day Subjective:   Objective: Blood pressure 126/76, pulse 87, temperature 98.2 F (36.8 C), temperature source Oral, resp. rate 18, height 5\' 9"  (1.753 m), weight 91.173 kg (201 lb), last menstrual period 12/26/2014, SpO2 98 %, unknown if currently breastfeeding.  Physical Exam:  General:25 yo white female in NAD, A,A,O x 3 Heart: S1S2, RRR, No M/R/G. Lungs: CTA bilat, no W/R/R/ Abd: U-3, FF.  Lochia: mod, no clots Voiding well, taking po well Perineum: no hematoma, healing well DVT Evaluation: Neg Homans   Recent Labs  10/02/15 2215 10/03/15 0438  HGB 12.3 12.5  HCT 36.3 36.0    Assessment/Plan: A: PPD#1 Stable NSVD with repair  P: Pt may opt to go home in am  LOS: 1 day   Sharee PimpleCaron W Maelys Kinnick 10/03/2015, 9:06 AM

## 2015-10-03 NOTE — Lactation Note (Signed)
.  Lactation Consultation Note  Patient Name: Saddie BendersSamantha E Murray RUEAV'WToday's Date: 10/03/2015 Reason for consult: Initial assessment   Maternal Data   Basic breast feeding reviewed.  Feeding Feeding Type: Breast Fed  LATCH Score/Interventions                      Lactation Tools Discussed/Used     Consult Status      Trudee GripCarolyn P Adithi Gammon 10/03/2015, 11:16 AM

## 2015-10-04 MED ORDER — OXYCODONE HCL 5 MG PO TABS: 5.0000 mg | ORAL_TABLET | ORAL | Status: DC | PRN

## 2015-10-04 MED ORDER — SENNOSIDES-DOCUSATE SODIUM 8.6-50 MG PO TABS: 2.0000 | ORAL_TABLET | ORAL | Status: DC

## 2015-10-04 MED ORDER — IBUPROFEN 600 MG PO TABS
600.0000 mg | ORAL_TABLET | Freq: Four times a day (QID) | ORAL | Status: DC
  Administered 2015-10-04: 600 mg via ORAL
  Filled 2015-10-04: qty 1

## 2015-10-04 NOTE — Anesthesia Postprocedure Evaluation (Signed)
Anesthesia Post Note  Patient: Danielle Wood  Procedure(s) Performed: * No procedures listed *  Patient location during evaluation: Mother Baby Anesthesia Type: Epidural Level of consciousness: awake and alert Pain management: pain level controlled Vital Signs Assessment: post-procedure vital signs reviewed and stable Respiratory status: spontaneous breathing, nonlabored ventilation and respiratory function stable Cardiovascular status: stable Postop Assessment: no headache, no backache and epidural receding Anesthetic complications: no    Last Vitals:  Filed Vitals:   10/04/15 0057 10/04/15 0723  BP: 112/53 120/73  Pulse: 83 74  Temp: 37.1 C 36.4 C  Resp: 20 18    Last Pain:  Filed Vitals:   10/04/15 0724  PainSc: 2                  Danielle Wood,  Danielle Wood

## 2015-10-04 NOTE — Progress Notes (Signed)
Pt discharged home with infant.  Discharge instructions and follow up appointment given to and reviewed with pt.  Pt verbalized understanding.  Escorted by auxillary. 

## 2015-10-04 NOTE — Discharge Summary (Signed)
OB Discharge Summary     Patient Name: Danielle Wood DOB: 1991-02-25 MRN: [id number]  Date of admission: 10/02/2015 Delivering MD: Burnett CorrenteHALFON, Jahron Hunsinger K   Date of discharge: 10/04/2015  Admitting diagnosis: DELIVERY Intrauterine pregnancy: 7865w1d     Secondary diagnosis:  Active Problems:   Labor and delivery, indication for care  Additional problems: elective IOL     Discharge diagnosis: Term Pregnancy Delivered                                                                                                Post partum procedures:none  Augmentation: AROM and Pitocin  Complications: None  Hospital course:  Induction of Labor With Vaginal Delivery   25 y.o. yo G1P1001 at 6865w1d was admitted to the hospital 10/02/2015 for induction of labor.  Indication for induction: Favorable cervix at term.  Patient had an uncomplicated labor course as follows: Membrane Rupture Time/Date: 7:03 PM ,10/02/2015   Intrapartum Procedures: Episiotomy: Right Mediolateral [4]                                         Lacerations:     Patient had delivery of a Viable infant.  Information for the patient's newborn:  Theron AristaMurray, Girl Sarah [[id number]][[id number]]  Delivery Method: Vag-Spont   10/03/2015  Details of delivery can be found in separate delivery note.  Patient had a routine postpartum course. Patient is discharged home 10/04/2015.   Physical exam  Filed Vitals:   10/04/15 0048 10/04/15 0057 10/04/15 0723 10/04/15 1223  BP: 126/78 112/53 120/73   Pulse: 73 83 74   Temp: 98.4 F (36.9 C) 98.7 F (37.1 C) 97.6 F (36.4 C) 98 F (36.7 C)  TempSrc: Oral Oral Oral Oral  Resp: 18 20 18    Height:      Weight:      SpO2: 95% 100% 100%    General: alert, cooperative and no distress Lochia: appropriate Uterine Fundus: firm Incision: Healing well with no significant drainage DVT Evaluation: No evidence of DVT seen on physical exam. Labs: Lab Results  Component Value Date   WBC 15.2* 10/03/2015   HGB  12.5 10/03/2015   HCT 36.0 10/03/2015   MCV 89.9 10/03/2015   PLT 175 10/03/2015   CMP Latest Ref Rng 10/02/2015  Glucose 65 - 99 mg/dL 99  BUN 6 - 20 mg/dL 5(L)  Creatinine 7.820.44 - 1.00 mg/dL 9.560.58  Sodium 213135 - 086145 mmol/L 136  Potassium 3.5 - 5.1 mmol/L 3.7  Chloride 101 - 111 mmol/L 106  CO2 22 - 32 mmol/L 21(L)  Calcium 8.9 - 10.3 mg/dL 5.7(Q8.5(L)  Total Protein 6.5 - 8.1 g/dL 6.1(L)  Total Bilirubin 0.3 - 1.2 mg/dL 0.5  Alkaline Phos 38 - 126 U/L 95  AST 15 - 41 U/L 22  ALT 14 - 54 U/L 15    Discharge instruction: per After Visit Summary and "Baby and Me Booklet".  After visit meds:    Medication List    STOP taking these  medications        ondansetron 4 MG disintegrating tablet  Commonly known as:  ZOFRAN ODT     promethazine 12.5 MG suppository  Commonly known as:  PHENERGAN     promethazine 25 MG suppository  Commonly known as:  PHENERGAN     promethazine 25 MG tablet  Commonly known as:  PHENERGAN      TAKE these medications        oxyCODONE 5 MG immediate release tablet  Commonly known as:  Oxy IR/ROXICODONE  Take 1 tablet (5 mg total) by mouth every 4 (four) hours as needed (pain scale 4-7).     prenatal multivitamin Tabs tablet  Take 1 tablet by mouth daily at 12 noon.     senna-docusate 8.6-50 MG tablet  Commonly known as:  Senokot-S  Take 2 tablets by mouth daily.        Diet: routine diet  Activity: Advance as tolerated. Pelvic rest for 6 weeks.   Outpatient follow up:2 weeks Follow up Appt:No future appointments. Follow up Visit:No Follow-up on file.  Postpartum contraception: undecided  Newborn Data: Live born female  Birth Weight: 6 lb 10.9 oz (3030 g) APGAR: 9, 9  Baby Feeding: Bottle and Breast Disposition:home with mother   10/04/2015 Ala Dach, MD

## 2015-11-25 ENCOUNTER — Ambulatory Visit: Payer: Self-pay | Admitting: Physician Assistant

## 2015-11-25 VITALS — BP 110/80 | HR 98 | Temp 98.8°F

## 2015-11-25 DIAGNOSIS — J0100 Acute maxillary sinusitis, unspecified: Secondary | ICD-10-CM

## 2015-11-25 NOTE — Progress Notes (Signed)
S: cough x 1 week with fever of 101.  Right facial pain.  OTC without relief.  Non-smoker O: TMS dull, nose boggy, r max sinus tender, throat mod injection, neck s w/o aden.  Lungs cl bilat H rrr A: right max sinusitis

## 2015-12-24 ENCOUNTER — Encounter: Payer: Self-pay | Admitting: Physician Assistant

## 2015-12-24 ENCOUNTER — Ambulatory Visit: Payer: Self-pay | Admitting: Physician Assistant

## 2015-12-24 VITALS — BP 119/75 | HR 87 | Temp 98.7°F

## 2015-12-24 DIAGNOSIS — L049 Acute lymphadenitis, unspecified: Secondary | ICD-10-CM

## 2015-12-24 DIAGNOSIS — J029 Acute pharyngitis, unspecified: Secondary | ICD-10-CM

## 2015-12-24 MED ORDER — METHYLPREDNISOLONE 4 MG PO TBPK: ORAL_TABLET | ORAL | 0 refills | Status: DC

## 2015-12-24 NOTE — Progress Notes (Signed)
S: c/o continued sore throat and swollen glands on left side, no fever/chills, finished amoxil over 1.5 weeks ago, still hurts to swallow, painful at back of her tongue, no cough, does get a small amount of green mucus out  O: vitals wnl, nad, tms clear, nasal mucosa a little boggy, throat wnl, neck supple with anterior cervical lymph on left side only, lungs c t a, cv rrr  A: lymphadenitis, sore throat  P: cbc mono, medrol dose pack, will refer to ent;

## 2015-12-25 LAB — CBC WITH DIFFERENTIAL/PLATELET
BASOS: 0 %
Basophils Absolute: 0 10*3/uL (ref 0.0–0.2)
EOS (ABSOLUTE): 0.1 10*3/uL (ref 0.0–0.4)
EOS: 2 %
HEMATOCRIT: 37.4 % (ref 34.0–46.6)
HEMOGLOBIN: 12.5 g/dL (ref 11.1–15.9)
Immature Grans (Abs): 0 10*3/uL (ref 0.0–0.1)
Immature Granulocytes: 0 %
LYMPHS ABS: 2.6 10*3/uL (ref 0.7–3.1)
Lymphs: 31 %
MCH: 30 pg (ref 26.6–33.0)
MCHC: 33.4 g/dL (ref 31.5–35.7)
MCV: 90 fL (ref 79–97)
MONOCYTES: 6 %
Monocytes Absolute: 0.5 10*3/uL (ref 0.1–0.9)
NEUTROS ABS: 5.1 10*3/uL (ref 1.4–7.0)
Neutrophils: 61 %
Platelets: 303 10*3/uL (ref 150–379)
RBC: 4.16 x10E6/uL (ref 3.77–5.28)
RDW: 13.6 % (ref 12.3–15.4)
WBC: 8.3 10*3/uL (ref 3.4–10.8)

## 2015-12-25 LAB — MONO QUAL W/RFLX QN: Mono Qual W/Rflx Qn: NEGATIVE

## 2016-01-07 NOTE — Progress Notes (Signed)
Patient ID: Danielle Wood, female   DOB: 12-11-1990, 25 y.o.   MRN: [id number] Patient has been referred to see Dr. Jenne CampusMcQueen at [facility] on 01/16/16 @ 1:45pm.  I left a message on the patient's voicemail informing her about her appointment.

## 2016-01-10 ENCOUNTER — Encounter
Admission: RE | Admit: 2016-01-10 | Discharge: 2016-01-10 | Disposition: A | Discharge status: Home / Self Care | Payer: PRIVATE HEALTH INSURANCE | Source: Ambulatory Visit | Attending: Unknown Physician Specialty | Admitting: Unknown Physician Specialty

## 2016-01-10 HISTORY — DX: Anxiety disorder, unspecified: F41.9

## 2016-01-10 NOTE — Patient Instructions (Signed)
  Your procedure is scheduled BJ:YNWGNFAon:Tuesday August 29 , 2017. Report to Same Day Surgery. To find out your arrival time please call [phone] between 1PM - 3PM on Monday January 13, 2016.  Remember: Instructions that are not followed completely may result in serious medical risk, up to and including death, or upon the discretion of your surgeon and anesthesiologist your surgery may need to be rescheduled.    _x___ 1. Do not eat food or drink liquids after midnight. No gum chewing or  hard candies.     _x___ 2. No Alcohol for 24 hours before or after surgery.   ____ 3. Bring all medications with you on the day of surgery if instructed.    __x__ 4. Notify your doctor if there is any change in your medical condition     (cold, fever, infections).     Do not wear jewelry, make-up, hairpins, clips or nail polish.  Do not wear lotions, powders, or perfumes. You may wear deodorant.  Do not shave 48 hours prior to surgery. Men may shave face and neck.  Do not bring valuables to the hospital.    [facility] not responsible for any belongings or valuables.               Contacts, dentures or bridgework may not be worn into surgery.  Leave your suitcase in the car. After surgery it may be brought to your room.  For patients admitted to the hospital, discharge time is determined by your treatment team.   Patients discharged the day of surgery will not be allowed to drive home.    Please read over the following fact sheets that you were given:   [facility] Pre[facility]  ____ Take these medicines the morning of surgery with A SIP OF WATER: none    ____ Fleet Enema (as directed)   ____ Use CHG Soap as directed on instruction sheet  ____ Use inhalers on the day of surgery and bring to hospital day of surgery  ____ Stop metformin 2 days prior to surgery    ____ Take 1/2 of usual insulin dose the night before surgery and none on the morning of surgery.   ____ Stop  Coumadin/Plavix/aspirin on does not apply.  _x___ Stop Anti-inflammatories such as Advil, Aleve, Ibuprofen, Motrin, Naproxen, Naprosyn, Goodies powders or aspirin products. OK to take tylenol.   ____ Stop supplements until after surgery.    ____ Bring C-Pap to the hospital.

## 2016-01-14 ENCOUNTER — Ambulatory Visit: Payer: Managed Care, Other (non HMO) | Admitting: Anesthesiology

## 2016-01-14 ENCOUNTER — Encounter: Payer: Self-pay | Admitting: *Deleted

## 2016-01-14 ENCOUNTER — Encounter
Admission: RE | Disposition: A | Discharge status: Home / Self Care | Payer: Self-pay | Source: Ambulatory Visit | Attending: Unknown Physician Specialty

## 2016-01-14 ENCOUNTER — Ambulatory Visit
Admission: RE | Admit: 2016-01-14 | Discharge: 2016-01-14 | Disposition: A | Discharge status: Home / Self Care | Payer: Managed Care, Other (non HMO) | Source: Ambulatory Visit | Attending: Unknown Physician Specialty | Admitting: Unknown Physician Specialty

## 2016-01-14 DIAGNOSIS — J352 Hypertrophy of adenoids: Secondary | ICD-10-CM | POA: Diagnosis not present

## 2016-01-14 DIAGNOSIS — J3501 Chronic tonsillitis: Secondary | ICD-10-CM | POA: Insufficient documentation

## 2016-01-14 DIAGNOSIS — Z8249 Family history of ischemic heart disease and other diseases of the circulatory system: Secondary | ICD-10-CM | POA: Insufficient documentation

## 2016-01-14 HISTORY — PX: TONSILLECTOMY AND ADENOIDECTOMY: SHX28

## 2016-01-14 LAB — POCT PREGNANCY, URINE: Preg Test, Ur: NEGATIVE

## 2016-01-14 SURGERY — TONSILLECTOMY AND ADENOIDECTOMY
Anesthesia: General

## 2016-01-14 MED ORDER — FAMOTIDINE 20 MG PO TABS
ORAL_TABLET | ORAL | Status: AC
  Administered 2016-01-14: 20 mg via ORAL
  Filled 2016-01-14: qty 1

## 2016-01-14 MED ORDER — ONDANSETRON HCL 4 MG/2ML IJ SOLN: 4.0000 mg | Freq: Once | INTRAMUSCULAR | Status: DC | PRN

## 2016-01-14 MED ORDER — DEXAMETHASONE SODIUM PHOSPHATE 10 MG/ML IJ SOLN
INTRAMUSCULAR | Status: DC | PRN
  Administered 2016-01-14: 10 mg via INTRAVENOUS

## 2016-01-14 MED ORDER — BUPIVACAINE HCL (PF) 0.5 % IJ SOLN
INTRAMUSCULAR | Status: AC
  Filled 2016-01-14: qty 30

## 2016-01-14 MED ORDER — FAMOTIDINE 20 MG PO TABS
20.0000 mg | ORAL_TABLET | Freq: Once | ORAL | Status: AC
  Administered 2016-01-14: 20 mg via ORAL

## 2016-01-14 MED ORDER — HYDROCODONE-ACETAMINOPHEN 7.5-325 MG/15ML PO SOLN
10.0000 mL | ORAL | Status: DC | PRN
  Administered 2016-01-14: 10 mL via ORAL

## 2016-01-14 MED ORDER — SUCCINYLCHOLINE CHLORIDE 20 MG/ML IJ SOLN
INTRAMUSCULAR | Status: DC | PRN
  Administered 2016-01-14: 100 mg via INTRAVENOUS

## 2016-01-14 MED ORDER — HYDROCODONE-ACETAMINOPHEN 7.5-325 MG/15ML PO SOLN: 10.0000 mL | ORAL | 0 refills | Status: DC | PRN

## 2016-01-14 MED ORDER — PROPOFOL 10 MG/ML IV BOLUS
INTRAVENOUS | Status: DC | PRN
Start: 2016-01-14 — End: 2016-01-14
  Administered 2016-01-14: 150 mg via INTRAVENOUS
  Administered 2016-01-14: 50 mg via INTRAVENOUS

## 2016-01-14 MED ORDER — FENTANYL CITRATE (PF) 100 MCG/2ML IJ SOLN
INTRAMUSCULAR | Status: DC | PRN
  Administered 2016-01-14: 100 ug via INTRAVENOUS
  Administered 2016-01-14: 50 ug via INTRAVENOUS

## 2016-01-14 MED ORDER — LACTATED RINGERS IV SOLN
INTRAVENOUS | Status: DC
Start: 2016-01-14 — End: 2016-01-14
  Administered 2016-01-14: 09:00:00 via INTRAVENOUS

## 2016-01-14 MED ORDER — LIDOCAINE HCL (CARDIAC) 20 MG/ML IV SOLN
INTRAVENOUS | Status: DC | PRN
Start: 2016-01-14 — End: 2016-01-14
  Administered 2016-01-14: 30 mg via INTRAVENOUS

## 2016-01-14 MED ORDER — HYDROCODONE-ACETAMINOPHEN 7.5-325 MG/15ML PO SOLN
ORAL | Status: AC
  Filled 2016-01-14: qty 15

## 2016-01-14 MED ORDER — ONDANSETRON HCL 4 MG/2ML IJ SOLN
INTRAMUSCULAR | Status: DC | PRN
  Administered 2016-01-14: 4 mg via INTRAVENOUS

## 2016-01-14 MED ORDER — MIDAZOLAM HCL 2 MG/2ML IJ SOLN
INTRAMUSCULAR | Status: DC | PRN
  Administered 2016-01-14: 2 mg via INTRAVENOUS

## 2016-01-14 MED ORDER — FENTANYL CITRATE (PF) 100 MCG/2ML IJ SOLN
25.0000 ug | INTRAMUSCULAR | Status: DC | PRN
  Administered 2016-01-14 (×4): 25 ug via INTRAVENOUS

## 2016-01-14 MED ORDER — BUPIVACAINE HCL 0.5 % IJ SOLN
INTRAMUSCULAR | Status: DC | PRN
  Administered 2016-01-14: 8 mL

## 2016-01-14 MED ORDER — ROCURONIUM BROMIDE 100 MG/10ML IV SOLN
INTRAVENOUS | Status: DC | PRN
  Administered 2016-01-14: 5 mg via INTRAVENOUS

## 2016-01-14 MED ORDER — FENTANYL CITRATE (PF) 100 MCG/2ML IJ SOLN
INTRAMUSCULAR | Status: AC
  Filled 2016-01-14: qty 2

## 2016-01-14 SURGICAL SUPPLY — 15 items
CANISTER SUCT 1200ML W/VALVE (MISCELLANEOUS) ×3 IMPLANT
CATH ROBINSON RED A/P 8FR (CATHETERS) ×3 IMPLANT
COAG SUCT 10F 3.5MM HAND CTRL (MISCELLANEOUS) ×3 IMPLANT
ELECT CAUTERY BLADE TIP 2.5 (TIP) ×3
ELECT REM PT RETURN 9FT ADLT (ELECTROSURGICAL) ×3
ELECTRODE CAUTERY BLDE TIP 2.5 (TIP) ×1 IMPLANT
ELECTRODE REM PT RTRN 9FT ADLT (ELECTROSURGICAL) ×1 IMPLANT
GLOVE BIO SURGEON STRL SZ7.5 (GLOVE) ×3 IMPLANT
HANDLE SUCTION POOLE (INSTRUMENTS) ×1 IMPLANT
NS IRRIG 500ML POUR BTL (IV SOLUTION) ×3 IMPLANT
PACK HEAD/NECK (MISCELLANEOUS) ×3 IMPLANT
SOL ANTI-FOG 6CC FOG-OUT (MISCELLANEOUS) ×1 IMPLANT
SOL FOG-OUT ANTI-FOG 6CC (MISCELLANEOUS) ×2
SPONGE TONSIL 1 RF SGL (DISPOSABLE) ×3 IMPLANT
SUCTION POOLE HANDLE (INSTRUMENTS) ×3

## 2016-01-14 NOTE — Anesthesia Postprocedure Evaluation (Signed)
Anesthesia Post Note  Patient: Danielle Wood  Procedure(s) Performed: Procedure(s) (LRB): TONSILLECTOMY AND ADENOIDECTOMY (N/A)  Patient location during evaluation: PACU Anesthesia Type: General Level of consciousness: awake and alert Pain management: pain level controlled Vital Signs Assessment: post-procedure vital signs reviewed and stable Respiratory status: spontaneous breathing and respiratory function stable Cardiovascular status: stable Anesthetic complications: no    Last Vitals:  Vitals:   01/14/16 0837 01/14/16 0952  BP: 123/80 130/83  Pulse: 86   Resp: 18 12  Temp: (!) 35.9 C 36.3 C    Last Pain:  Vitals:   01/14/16 0952  TempSrc:   PainSc: 0-No pain                 KEPHART,WILLIAM K

## 2016-01-14 NOTE — Anesthesia Procedure Notes (Signed)
Procedure Name: Intubation Date/Time: 01/14/2016 9:09 AM Performed by: Omer JackWEATHERLY, Adonis Ryther Pre-anesthesia Checklist: Patient identified, Patient being monitored, Timeout performed, Emergency Drugs available and Suction available Patient Re-evaluated:Patient Re-evaluated prior to inductionOxygen Delivery Method: Circle system utilized Preoxygenation: Pre-oxygenation with 100% oxygen Intubation Type: IV induction Ventilation: Mask ventilation without difficulty Laryngoscope Size: Mac and 3 Grade View: Grade I Tube type: Oral Rae Tube size: 7.0 mm Number of attempts: 1 Placement Confirmation: ETT inserted through vocal cords under direct vision,  positive ETCO2 and breath sounds checked- equal and bilateral Secured at: 21 cm Tube secured with: Tape Dental Injury: Teeth and Oropharynx as per pre-operative assessment

## 2016-01-14 NOTE — Discharge Instructions (Signed)
Tonsillectomy, Adult, Care After Refer to this sheet in the next few weeks. These instructions provide you with information on caring for yourself after your procedure. Your health care provider may also give you more specific instructions. Your treatment has been planned according to current medical practices, but problems sometimes occur. Call your health care provider if you have any problems or questions after your procedure. WHAT TO EXPECT AFTER THE PROCEDURE After your procedure, it is typical to have the following:  Your tongue will be numb, and your sense of taste will be reduced.  Your swallowing will be difficult and painful.  Your jaw may hurt or make a clicking noise when you yawn or chew.  Liquids that you drink may leak out of your nose.  Your voice may sound muffled.  The area at the middle of the roof of your mouth (uvula) may be very swollen.  You may have a constant cough and need to clear mucus and phlegm from your throat. HOME CARE INSTRUCTIONS   Get proper rest, keeping your head elevated at all times.  Drink plenty of fluids. This reduces pain and hastens the healing process.  Take medicines only as directed by your health care provider.  Soft and cold foods, such as gelatin, sherbet, ice cream, frozen ice pops, and cold drinks, are usually the easiest to eat. Several days after surgery, you will be able to eat more solid food.  Avoid mouthwashes and gargles.  Avoid contact with people who have upper respiratory infections, such as colds and sore throats. SEEK MEDICAL CARE IF:   You have increasing pain that is not controlled with medicines.  You have a fever.  You have a rash.  You feel light-headed or faint.  You are unable to swallow even small amounts of liquid or saliva.  Your urine is becoming very dark. SEEK IMMEDIATE MEDICAL CARE IF:   You have difficulty breathing.  You experience side effects or allergic reactions to medicines.  You  bleed bright red blood from your throat, or you vomit bright red blood.   This information is not intended to replace advice given to you by your health care provider. Make sure you discuss any questions you have with your health care provider.   Document Released: 03/04/2004 Document Revised: 09/18/2014 Document Reviewed: 11/29/2012 Elsevier Interactive Patient Education 2016 [employer]      AMBULATORY SURGERY  DISCHARGE INSTRUCTIONS   1) The drugs that you were given will stay in your system until tomorrow so for the next 24 hours you should not:  A) Drive an automobile B) Make any legal decisions C) Drink any alcoholic beverage   2) You may resume regular meals tomorrow.  Today it is better to start with liquids and gradually work up to solid foods.  You may eat anything you prefer, but it is better to start with liquids, then soup and crackers, and gradually work up to solid foods.   3) Please notify your doctor immediately if you have any unusual bleeding, trouble breathing, redness and pain at the surgery site, drainage, fever, or pain not relieved by medication. 4)   5) Your post-operative visit with Dr.                                     is: Date:  Time:    Please call to schedule your post-operative visit.  6) Additional Instructions:

## 2016-01-14 NOTE — Op Note (Signed)
PREOPERATIVE DIAGNOSIS:  chronic tonsillitis  POSTOPERATIVE DIAGNOSIS: Same  OPERATION:  Tonsillectomy and adenoidectomy.  SURGEON:  Davina Pokehapman T. Xxavier Noon, MD  ANESTHESIA:  General endotracheal.  OPERATIVE FINDINGS:  Large tonsils and adenoids.  DESCRIPTION OF THE PROCEDURE:  Danielle Wood was identified in the holding area and taken to the operating room and placed in the supine position.  After general endotracheal anesthesia, the table was turned 45 degrees and the patient was draped in the usual fashion for a tonsillectomy.  A mouth gag was inserted into the oral cavity and examination of the oropharynx showed the uvula was non-bifid.  There was no evidence of submucous cleft to the palate.  There were large tonsils.  A red rubber catheter was placed through the nostril.  Examination of the nasopharynx showed large obstructing adenoids.  Under indirect vision with the mirror, an adenotome was placed in the nasopharynx.  The adenoids were curetted free.  Reinspection with a mirror showed excellent removal of the adenoid.  Nasopharyngeal packs were then placed.  The operation then turned to the tonsillectomy.  Beginning on the left-hand side a tenaculum was used to grasp the tonsil and the Bovie cautery was used to dissect it free from the fossa.  In a similar fashion, the right tonsil was removed.  Meticulous hemostasis was achieved using the Bovie cautery.  With both tonsils removed and no active bleeding, the nasopharyngeal packs were removed.  Suction cautery was then used to cauterize the nasopharyngeal bed to prevent bleeding.  The red rubber catheter was removed with no active bleeding.  0.5% plain Marcaine was used to inject the anterior and posterior tonsillar pillars bilaterally.  A total of 7ml was used.  The patient tolerated the procedure well and was awakened in the operating room and taken to the recovery room in stable condition.   CULTURES:  None.  SPECIMENS:  Tonsils and  adenoids.  ESTIMATED BLOOD LOSS:  Less than 20 ml.  Albina Gosney T  01/14/2016  9:34 AM

## 2016-01-14 NOTE — Anesthesia Preprocedure Evaluation (Signed)
Anesthesia Evaluation  Patient identified by MRN, date of birth, ID band Patient awake    Reviewed: Allergy & Precautions, NPO status , Patient's Chart, lab work & pertinent test results  History of Anesthesia Complications Negative for: history of anesthetic complications  Airway Mallampati: II       Dental   Pulmonary neg pulmonary ROS,           Cardiovascular negative cardio ROS       Neuro/Psych Anxiety negative neurological ROS     GI/Hepatic negative GI ROS, Neg liver ROS,   Endo/Other  negative endocrine ROS  Renal/GU negative Renal ROS     Musculoskeletal negative musculoskeletal ROS (+)   Abdominal   Peds  Hematology negative hematology ROS (+)   Anesthesia Other Findings   Reproductive/Obstetrics                             Anesthesia Physical Anesthesia Plan  ASA: II  Anesthesia Plan: General   Post-op Pain Management:    Induction: Intravenous  Airway Management Planned: Oral ETT  Additional Equipment:   Intra-op Plan:   Post-operative Plan:   Informed Consent: I have reviewed the patients History and Physical, chart, labs and discussed the procedure including the risks, benefits and alternatives for the proposed anesthesia with the patient or authorized representative who has indicated his/her understanding and acceptance.     Plan Discussed with:   Anesthesia Plan Comments:         Anesthesia Quick Evaluation

## 2016-01-14 NOTE — Transfer of Care (Signed)
Immediate Anesthesia Transfer of Care Note  Patient: Danielle Wood  Procedure(s) Performed: Procedure(s): TONSILLECTOMY AND ADENOIDECTOMY (N/A)  Patient Location: PACU  Anesthesia Type:General  Level of Consciousness: awake, alert  and oriented  Airway & Oxygen Therapy: Patient Spontanous Breathing and Patient connected to face mask oxygen  Post-op Assessment: Report given to RN and Post -op Vital signs reviewed and stable  Post vital signs: Reviewed and stable  Last Vitals:  Vitals:   01/14/16 0837 01/14/16 0952  BP: 123/80 130/83  Pulse: 86   Resp: 18 12  Temp: (!) 35.9 C 36.3 C    Last Pain:  Vitals:   01/14/16 0837  TempSrc: Tympanic         Complications: No apparent anesthesia complications

## 2016-01-14 NOTE — H&P (Signed)
  H+P  Reviewed and will be scanned in later. No changes noted. 

## 2016-01-15 LAB — SURGICAL PATHOLOGY

## 2016-07-06 ENCOUNTER — Ambulatory Visit: Payer: Self-pay | Admitting: Physician Assistant

## 2016-09-02 ENCOUNTER — Encounter: Payer: Self-pay | Admitting: Physician Assistant

## 2016-09-02 ENCOUNTER — Ambulatory Visit: Payer: Self-pay | Admitting: Physician Assistant

## 2016-09-02 VITALS — BP 120/80 | HR 97 | Temp 98.2°F

## 2016-09-02 DIAGNOSIS — A084 Viral intestinal infection, unspecified: Secondary | ICD-10-CM

## 2016-09-02 NOTE — Progress Notes (Signed)
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

## 2016-09-09 ENCOUNTER — Emergency Department
Admission: EM | Admit: 2016-09-09 | Discharge: 2016-09-09 | Disposition: A | Discharge status: Home / Self Care | Payer: Worker's Compensation | Attending: Student in an Organized Health Care Education/Training Program | Admitting: Student in an Organized Health Care Education/Training Program

## 2016-09-09 ENCOUNTER — Encounter: Payer: Self-pay | Admitting: Emergency Medicine

## 2016-09-09 DIAGNOSIS — Y990 Civilian activity done for income or pay: Secondary | ICD-10-CM | POA: Insufficient documentation

## 2016-09-09 DIAGNOSIS — Z7721 Contact with and (suspected) exposure to potentially hazardous body fluids: Secondary | ICD-10-CM | POA: Diagnosis not present

## 2016-09-09 DIAGNOSIS — S61432A Puncture wound without foreign body of left hand, initial encounter: Secondary | ICD-10-CM | POA: Insufficient documentation

## 2016-09-09 DIAGNOSIS — Y929 Unspecified place or not applicable: Secondary | ICD-10-CM | POA: Diagnosis not present

## 2016-09-09 DIAGNOSIS — Y9389 Activity, other specified: Secondary | ICD-10-CM | POA: Insufficient documentation

## 2016-09-09 DIAGNOSIS — W273XXA Contact with needle (sewing), initial encounter: Secondary | ICD-10-CM | POA: Insufficient documentation

## 2016-09-09 NOTE — ED Notes (Signed)
Dr. Roxan Hockey notified of negative result from lab (rapid screening)

## 2016-09-09 NOTE — Discharge Instructions (Signed)
Keep being awesome!  Follow up with Occupational Medicine.
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
## 2016-09-22 ENCOUNTER — Encounter: Payer: Self-pay | Admitting: Physician Assistant

## 2016-09-22 ENCOUNTER — Ambulatory Visit: Payer: Self-pay | Admitting: Physician Assistant

## 2016-09-22 VITALS — BP 110/70 | HR 60 | Temp 98.5°F | Resp 16 | Ht 69.0 in | Wt 177.0 lb

## 2016-09-22 DIAGNOSIS — Z0000 Encounter for general adult medical examination without abnormal findings: Secondary | ICD-10-CM

## 2016-09-22 DIAGNOSIS — Z0189 Encounter for other specified special examinations: Secondary | ICD-10-CM

## 2016-09-22 DIAGNOSIS — Z008 Encounter for other general examination: Secondary | ICD-10-CM

## 2016-09-22 NOTE — Progress Notes (Signed)
S: pt here for wellness physical and biometrics for insurance purposes, no complaints ros neg. PMH:   Anxiety  Social: occ etoh, nonsmoker, no drugs, married 1 child  Fam: as noted on chart  O: vitals wnl, nad, ENT wnl, neck supple no lymph, lungs c t a, cv rrr, abd soft nontender bs normal all 4 quads  A: wellness, biometric physical  P: f/u in birthday month for full labs, repeat biometrics and physical to get on yearly schedule, will do glucose and lipids today

## 2016-09-23 LAB — LIPID PANEL
CHOLESTEROL TOTAL: 155 mg/dL (ref 100–199)
Chol/HDL Ratio: 3.2 ratio (ref 0.0–4.4)
HDL: 48 mg/dL (ref >39)
LDL Calculated: 88 mg/dL (ref 0–99)
Triglycerides: 97 mg/dL (ref 0–149)
VLDL Cholesterol Cal: 19 mg/dL (ref 5–40)

## 2016-09-23 LAB — GLUCOSE, RANDOM: GLUCOSE: 91 mg/dL (ref 65–99)

## 2017-02-25 ENCOUNTER — Encounter: Payer: Self-pay | Admitting: Physician Assistant

## 2017-02-25 NOTE — Telephone Encounter (Signed)
See attached message

## 2017-03-30 ENCOUNTER — Ambulatory Visit: Payer: Self-pay | Admitting: Physician Assistant

## 2017-03-30 DIAGNOSIS — Z299 Encounter for prophylactic measures, unspecified: Secondary | ICD-10-CM

## 2017-03-30 NOTE — Progress Notes (Signed)
Gait patient flu vaccine, IM injection to the left deltoid

## 2017-04-07 IMAGING — US US OB COMP LESS 14 WK
1 series · 14 of 28 positions shown · non-contrast
Comparison: None for this pregnancy

CLINICAL DATA: Pelvic cramping, vaginal bleeding for 1 day, thought
to be 6 weeks pregnant, EGA 5 weeks 6 days by LMP, quantitative beta
HCG pending

EXAM:
OBSTETRIC <14 WK US AND TRANSVAGINAL OB US
TECHNIQUE: Both transabdominal and transvaginal ultrasound examinations were
performed for complete evaluation of the gestation as well as the
maternal uterus, adnexal regions, and pelvic cul-de-sac.
Transvaginal technique was performed to assess early pregnancy.

[Series 1: us ob comp less 14 wk · 0.15mm/px · 14 of 73 slices shown]
[im 3/73]
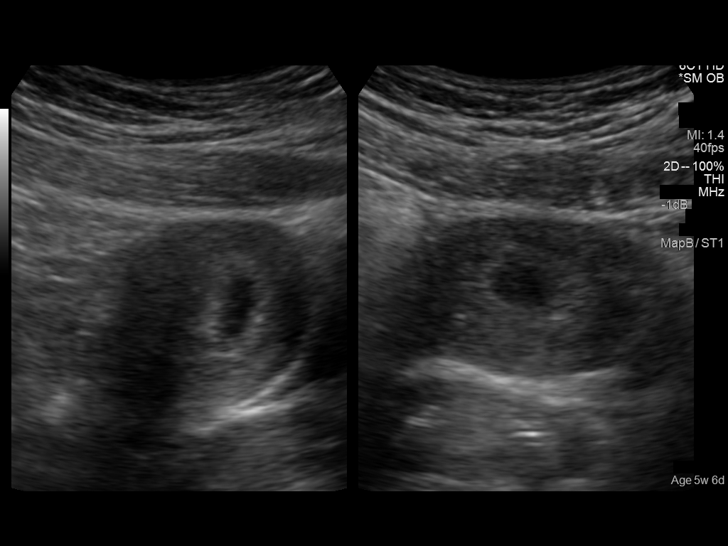
[im 9/73]
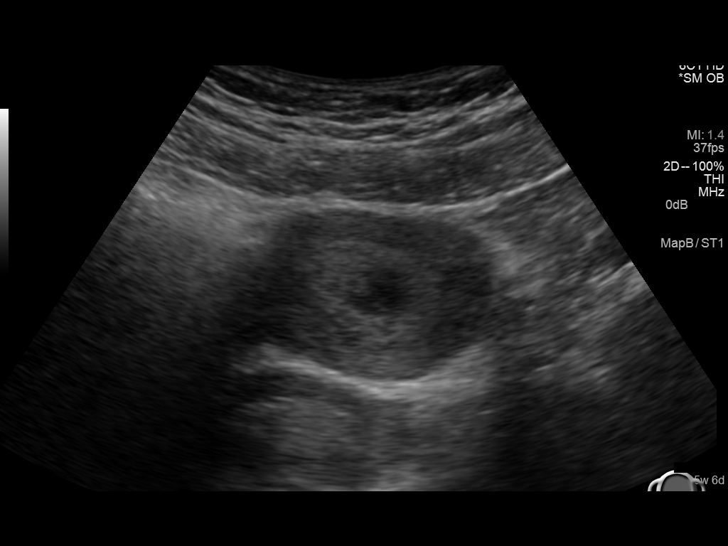
[im 14/73]
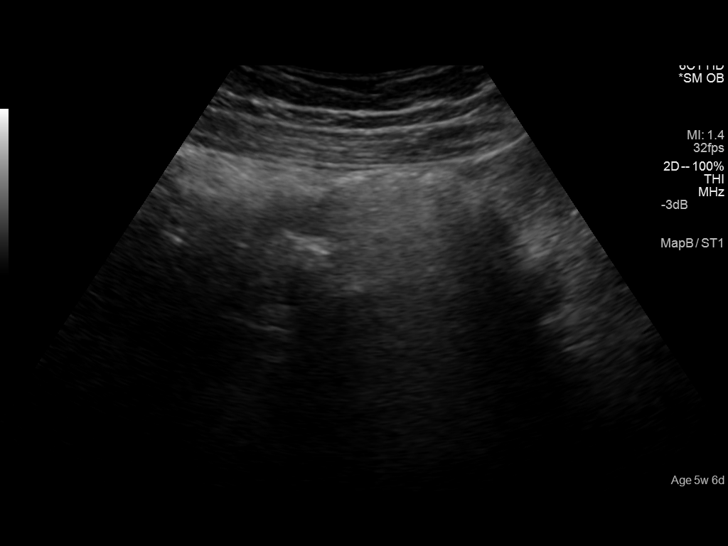
[im 19/73]
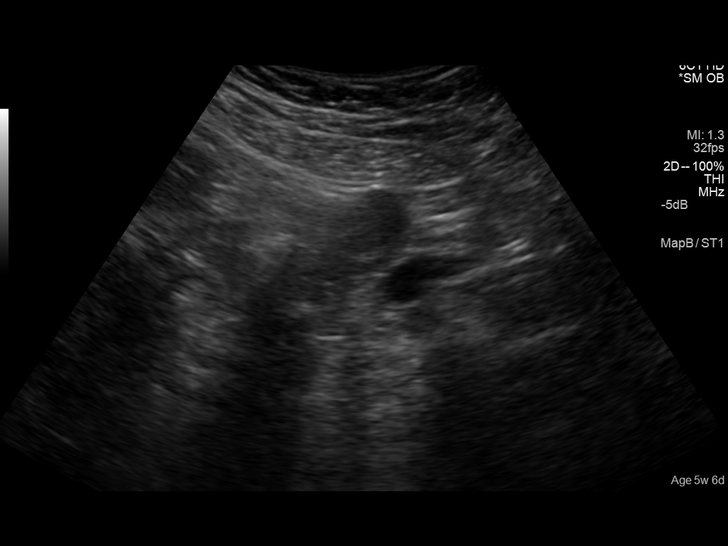
[im 25/73]
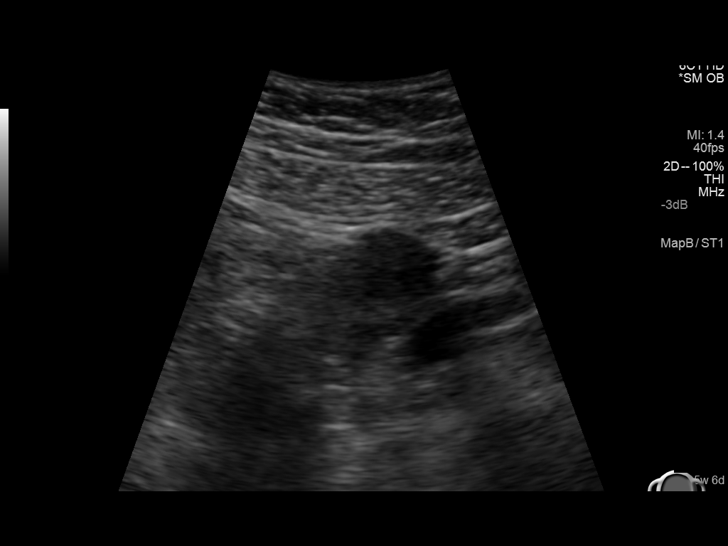
[im 30/73]
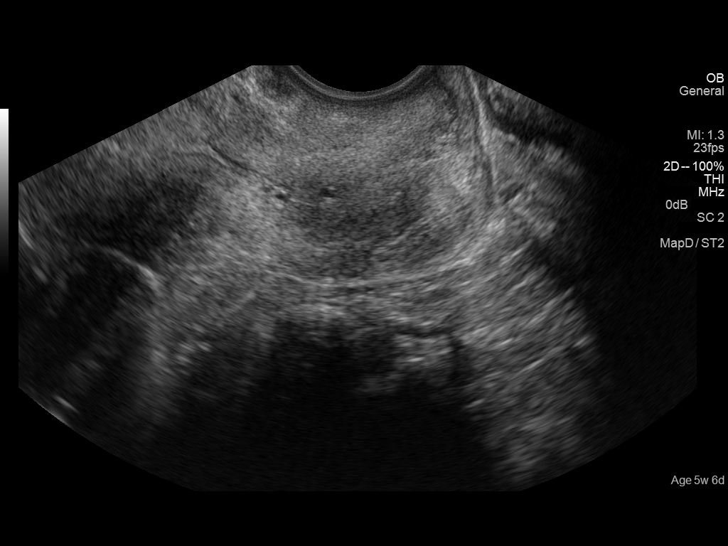
[im 35/73]
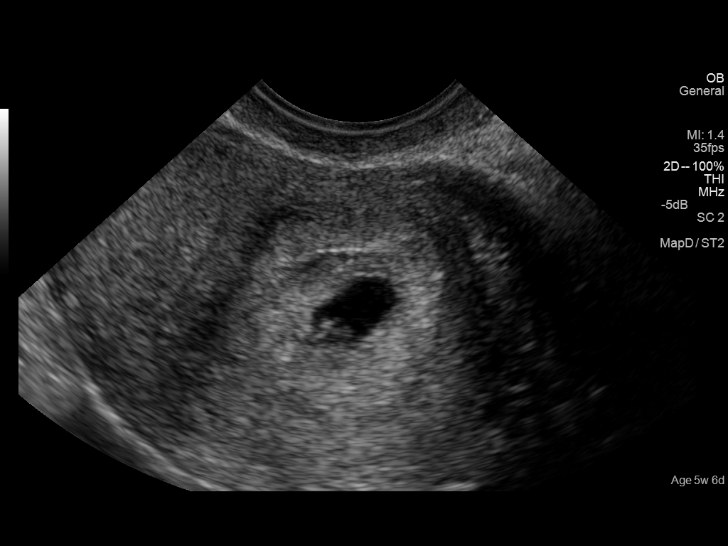
[im 41/73]
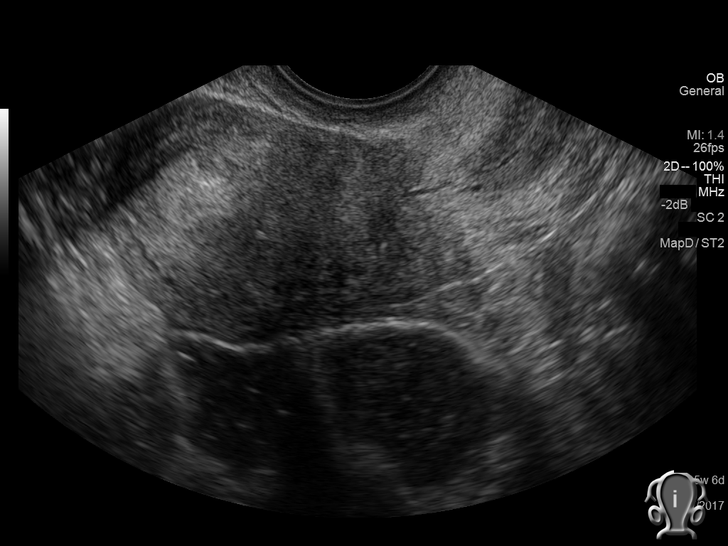
[im 46/73]
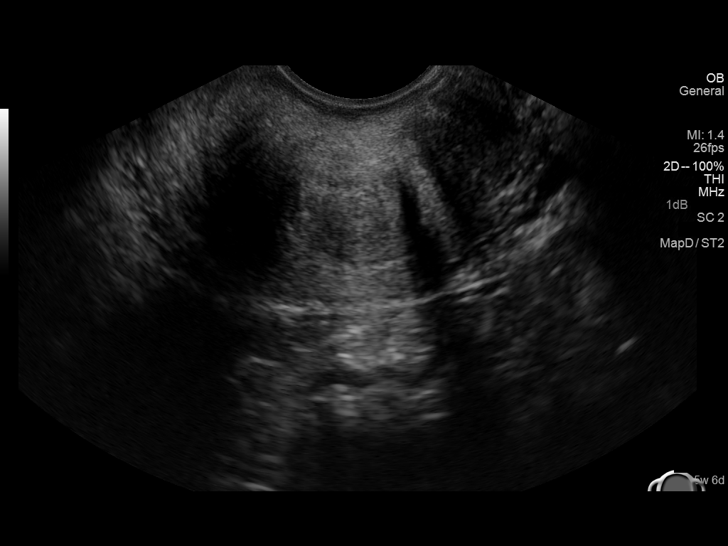
[im 51/73]
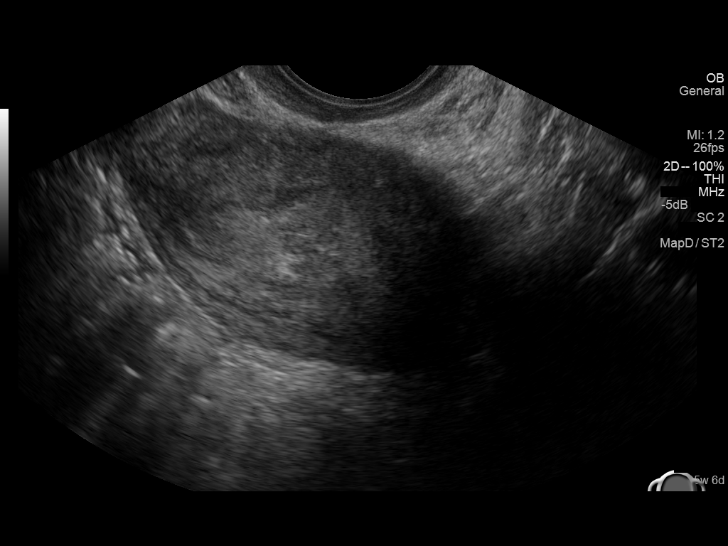
[im 57/73]
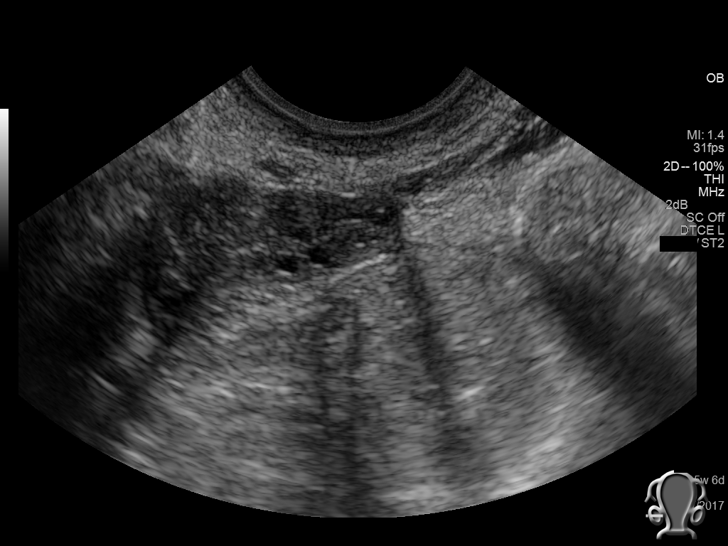
[im 62/73]
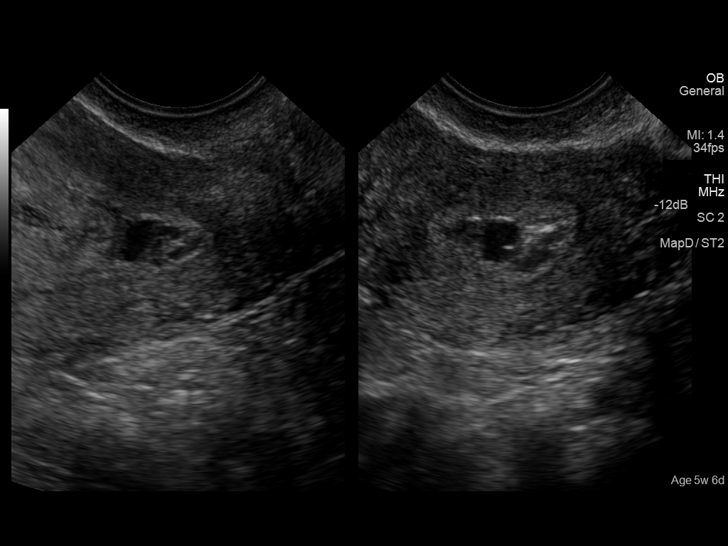
[im 67/73]
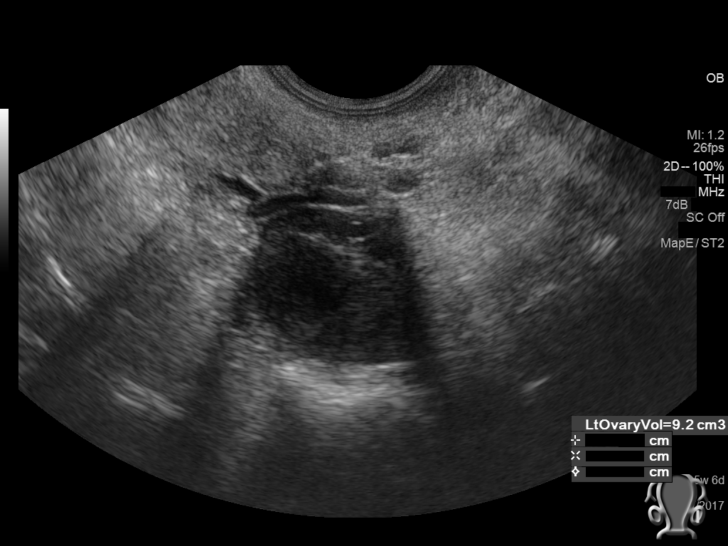
[im 73/73]
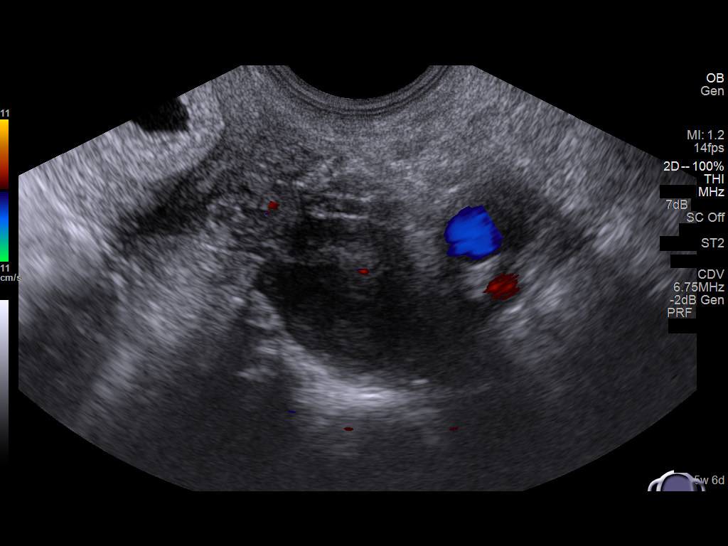

[14 of 28 positions shown; findings below may reference images not displayed]

FINDINGS: Intrauterine gestational sac: Visualized/normal in shape.

Yolk sac:  Present

Embryo:  Present

Cardiac Activity: Present

Heart Rate: 88  bpm

CRL:  1.7  mm   5 w   5 d                  US EDC: 10/03/2015

Maternal uterus/adnexae: Small subchorionic hemorrhage.

RIGHT ovary normal size and morphology 2.6 x 2.1 x 1.6 cm.

LEFT ovary 2.7 x 3.0 x 2.3 cm containing a small corpus luteal cyst.

No free pelvic fluid.
IMPRESSION: Single live intrauterine gestation at 5 weeks 5 days EGA by
crown-rump length.

Small subchronic hemorrhage.

## 2017-05-28 IMAGING — US US MFM FETAL NUCHAL TRANSLUCENCY
1 series · 14 of 28 positions shown · non-contrast
Comparison: none

[Series 1: us mfm fetal nuchal translucency · 0.25mm/px · 14 of 35 slices shown]
[im 2/35]
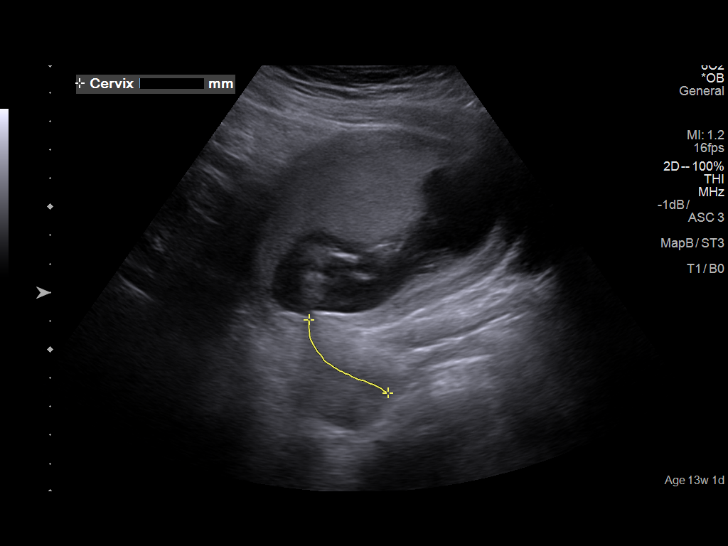
[im 4/35]
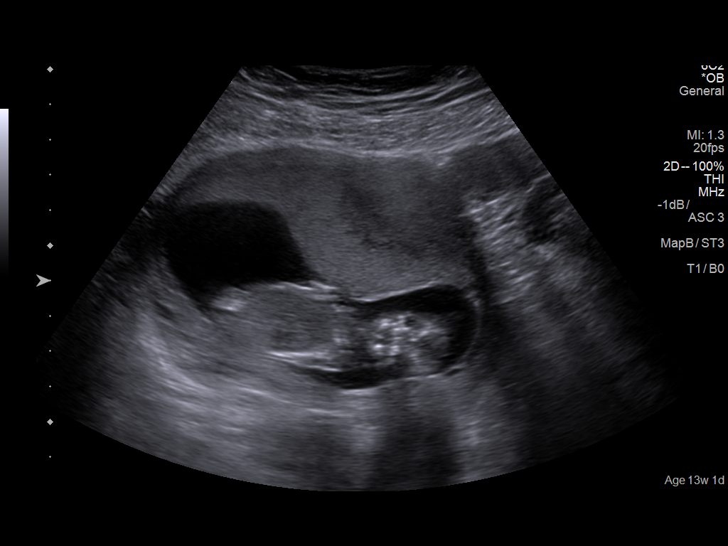
[im 7/35]
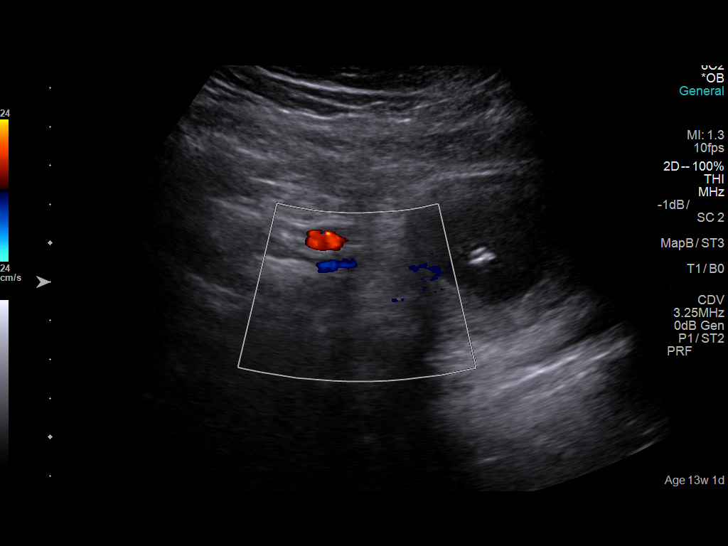
[im 9/35]
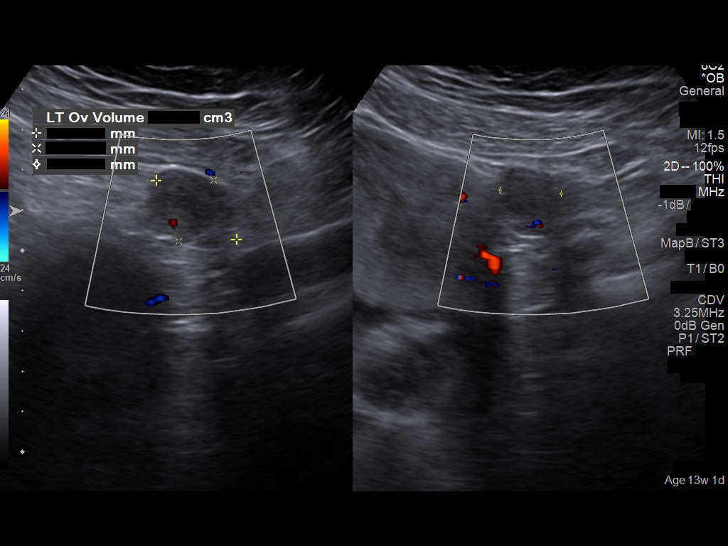
[im 12/35]
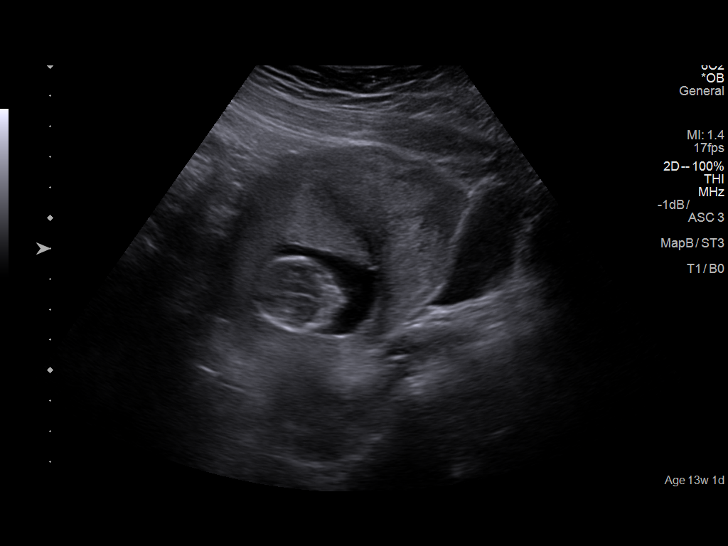
[im 14/35]
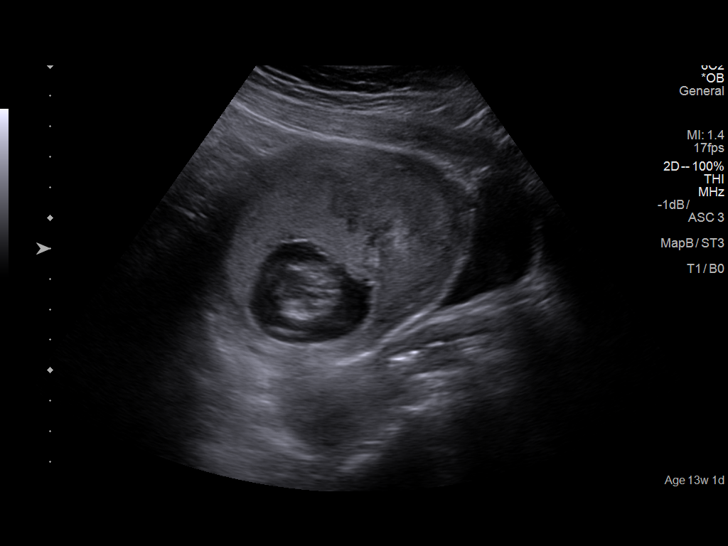
[im 17/35]
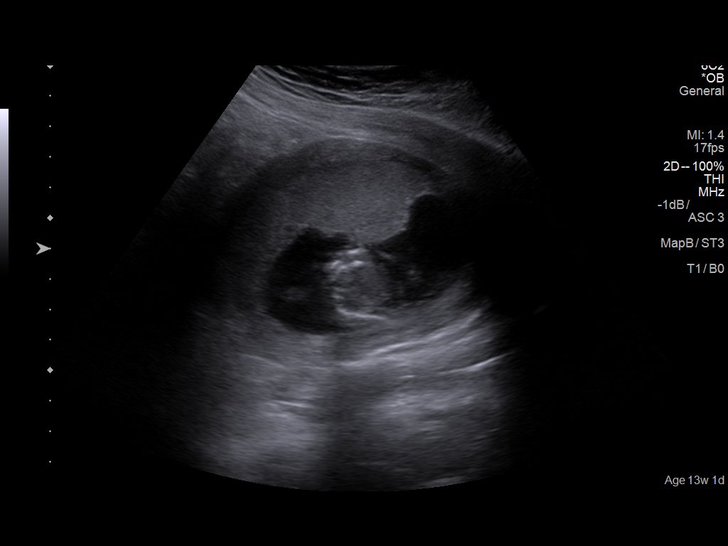
[im 19/35]
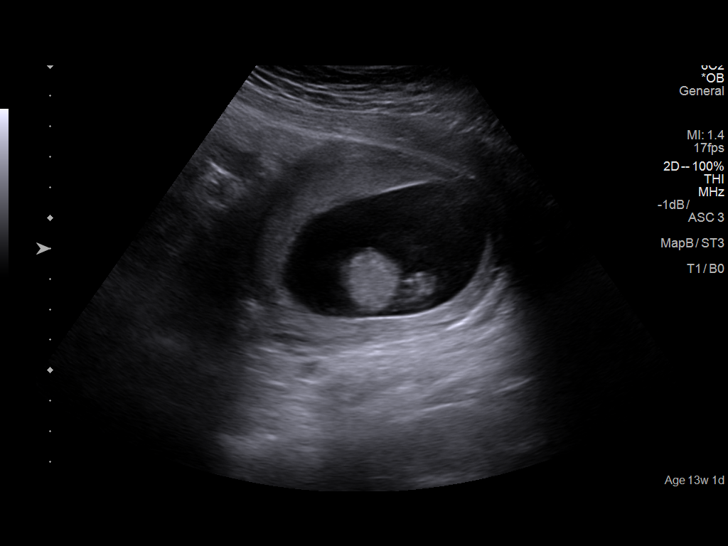
[im 22/35]
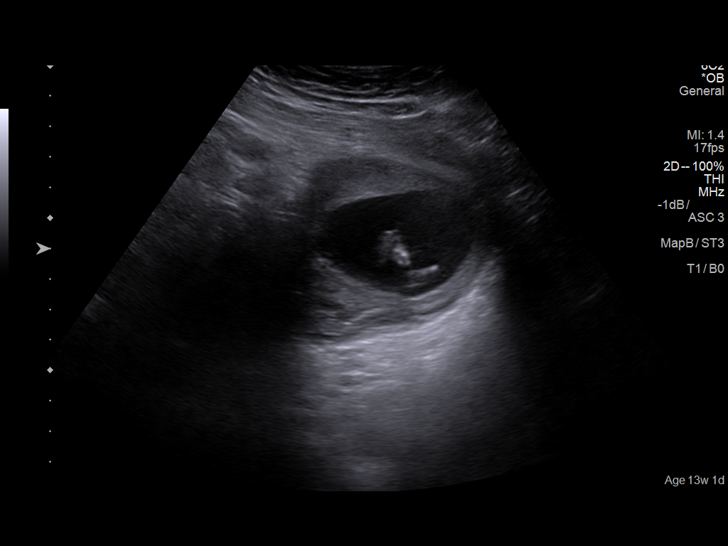
[im 24/35]
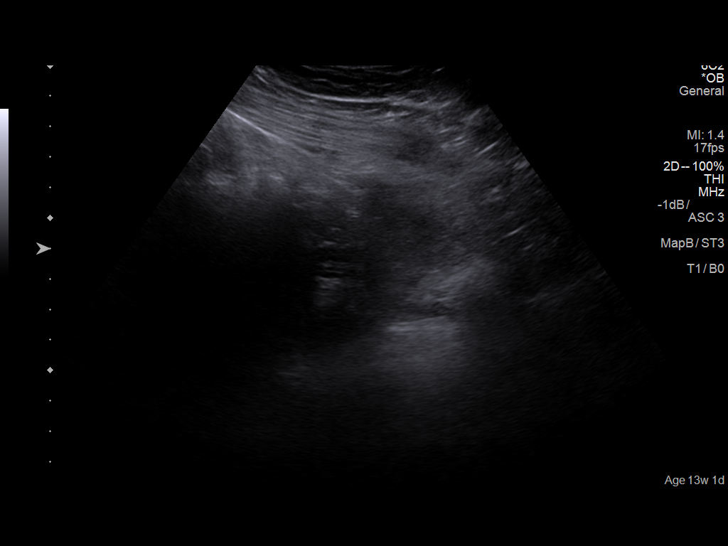
[im 27/35]
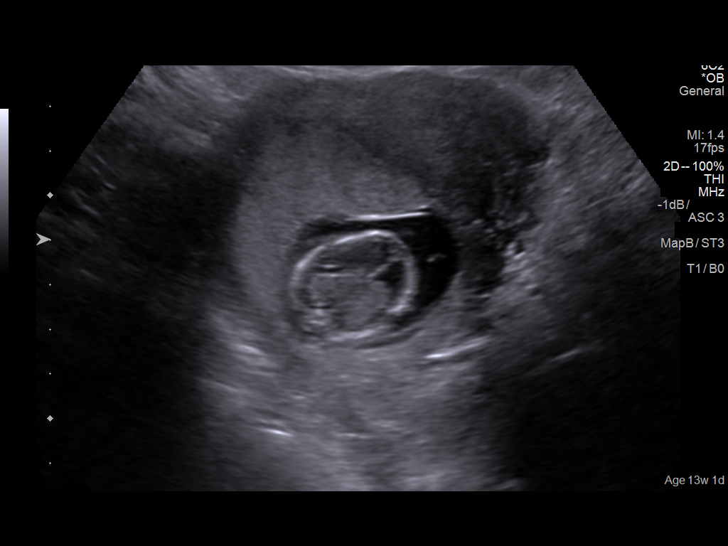
[im 29/35]
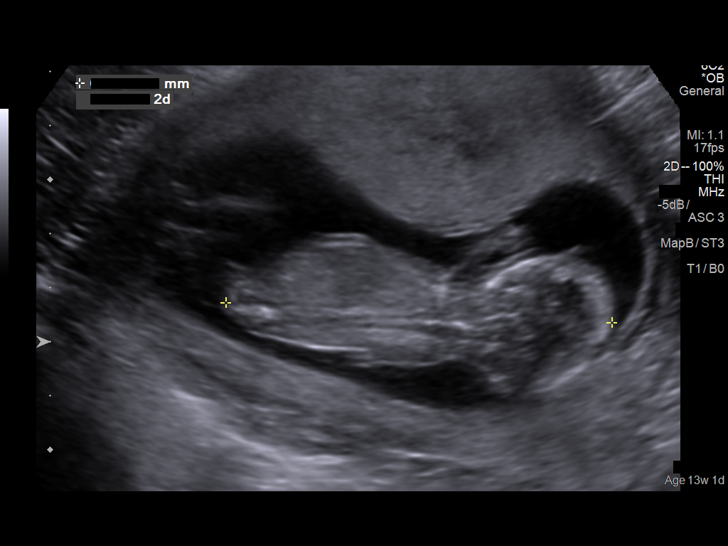
[im 32/35]
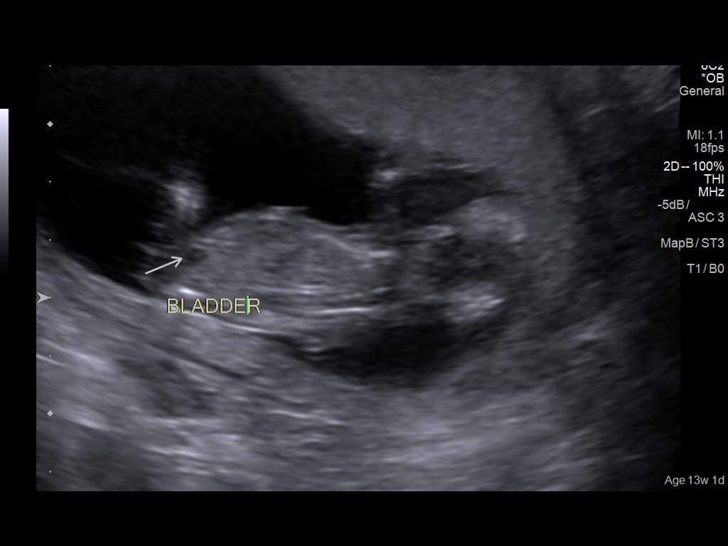
[im 35/35]
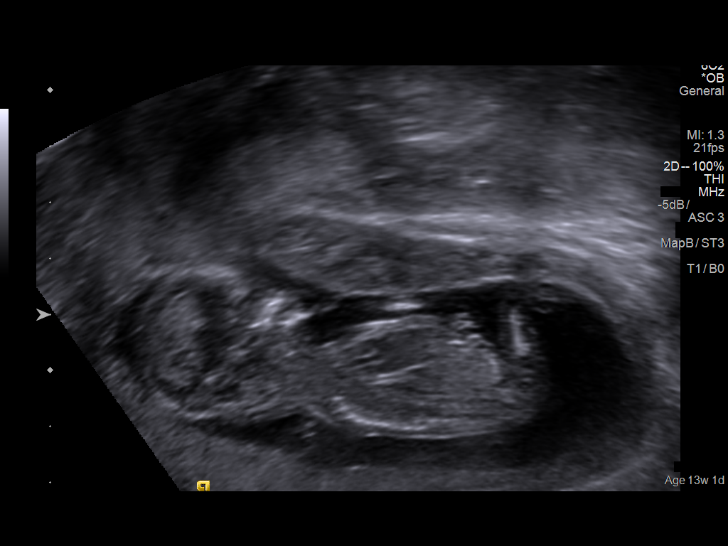

[14 of 28 positions shown; findings below may reference images not displayed]

Canned report from images found in remote index.

Refer to host system for actual result text.

## 2017-07-20 ENCOUNTER — Encounter: Payer: Self-pay | Admitting: Family Medicine

## 2017-07-20 ENCOUNTER — Ambulatory Visit: Payer: Self-pay | Admitting: Family Medicine

## 2017-07-20 VITALS — BP 128/76 | HR 72 | Temp 98.7°F | Resp 16 | Ht 69.0 in | Wt 165.0 lb

## 2017-07-20 DIAGNOSIS — G43909 Migraine, unspecified, not intractable, without status migrainosus: Secondary | ICD-10-CM

## 2017-07-20 DIAGNOSIS — Z299 Encounter for prophylactic measures, unspecified: Secondary | ICD-10-CM

## 2017-07-20 DIAGNOSIS — R55 Syncope and collapse: Secondary | ICD-10-CM

## 2017-07-20 NOTE — Progress Notes (Addendum)
Subjective: Annual biometrics screening  Patient presents for her annual biometric screening.  Patient has a history of anxiety and migraines.  Patient works as a [employer] fundraiser.  Patient's only other medical history is that 10 years ago she had a series of syncopal episodes, which was worked up at [facility].  Patient underwent an MRI, CT, lumbar puncture which were all negative.  It was assumed at that time that her syncopal episodes are related to her migraines because the syncope preceded her migraines.  Patient reports one subsequent episode since then, recently a week ago while she was at a funeral.  Patient denies full loss of consciousness or injury.  Reports she was standing at a funeral, felt dizzy as if she was going to pass out.  Patient has been completely asymptomatic since then.  shortly after the syncopal episode her first migraine in years began, similarly to the episodes in the past.  The patient reports she can detect syncope with ample time before they began.  Patient was previously on Topamax for her migraines but was taken off of this years ago since they had apparently stopped.  Denies chest pain, shortness of breath, palpitations prior to syncope.  Patient denies any cardiac or neurologic symptoms.  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  Patient feels this is stress-induced.  Patient has previously had multiple rounds of testing by Holter monitor without any abnormal results.  Patient denies any other medical problems or significant surgeries in the past other than tonsillectomy.  Patient does not currently have a primary care provider. Patient denies any other issues or concerns.   Review of Systems Constitutional: Unremarkable.  HEENT: Denies dizziness, issues with hearing, vision problems.  Gastrointestinal: Denies issues with bowel or bladder.  Respiratory: Unremarkable.   Cardiovascular: Unremarkable.  Musculoskeletal: No  arthralgias, myalgias, joint swelling, joint stiffness, back pain, neck pain. ROS otherwise negative.   Objective  Physical Exam General: Awake, alert and oriented. No acute distress. Well developed, hydrated and nourished. Appears stated age.  HEENT: PERRLA, EOM intact. Supple neck without adenopathy. Sclera is non-icteric. The ear canal is clear without discharge. The tympanic membrane is normal in appearance with normal landmarks and cone of light. Nasal mucosa is pink and moist. Oral mucosa is pink and moist. The pharynx is normal in appearance without tonsillar swelling or exudates.  Skin: Skin in warm, dry and intact without rashes or lesions. Appropriate color for ethnicity. Cardiac: Heart rate and rhythm are normal. No murmurs, gallops, or rubs are auscultated.  Respiratory: The chest wall is symmetric and without deformity. No signs of respiratory distress. Lung sounds are clear in all lobes bilaterally without rales, ronchi, or wheezes.  Abdominal: Abdomen is soft, symmetric, and non-tender without distention. No masses, hepatomegaly, or splenomegaly are noted.  Spine: Neck and back are without deformity. Curvature of the cervical, thoracic, and lumbar spine are within normal limits. Posture is upright, gait is smooth, steady, and within normal limits. No tenderness noted on palpation of the spinous processes. Spinous processes are midline. No discomfort is noted with flexion, extension, and side-to-side rotation of the cervical spine, full range of motion is noted. Full range of motion including flexion, extension, and side-to-side rotation of the thoracic and lumbar spine are noted and without discomfort. Grip strength is normal bilaterally. Dorsi/plantar flexion is normal bilaterally.  Extremities: Full range of motion is noted to all major joints. Steady gait noted.  Neurological:  The patient is awake, alert and oriented to person, place, and time with normal speech. Memory is normal and  thought processes intact. No gait abnormalities are appreciated. Normal reflexes. No sensory or motor deficit.  Equal strength and sensation bilaterally. Psychiatric: Appropriate mood and affect.   Assessment Annual biometrics screen  Plan  Labs pending. Encouraged routine visits with primary care provider and provided patient with local resources to establish care with a primary care provider.  Encouraged her to be seen by primary care provider within the next month. Neurology referral placed for further evaluation of syncope and migraine, with emphasis placed on prompt evaluation.  Discussed going to the emergency room for a further workup, patient said "I am not going to the emergency room for anything".  patient instructed not to drive until she has been cleared by neurology.  Patient  refuses EKG at this time or cardiology referral.  Red flag symptoms discussed as well as indications to present to the emergency department.

## 2017-07-21 LAB — CMP12+LP+TP+TSH+6AC+CBC/D/PLT
ALK PHOS: 36 IU/L — AB (ref 39–117)
ALT: 7 IU/L (ref 0–32)
AST: 13 IU/L (ref 0–40)
Albumin/Globulin Ratio: 1.6 (ref 1.2–2.2)
Albumin: 4.5 g/dL (ref 3.5–5.5)
BASOS: 0 %
BUN/Creatinine Ratio: 10 (ref 9–23)
BUN: 9 mg/dL (ref 6–20)
Basophils Absolute: 0 10*3/uL (ref 0.0–0.2)
Bilirubin Total: 0.6 mg/dL (ref 0.0–1.2)
CALCIUM: 9.2 mg/dL (ref 8.7–10.2)
Chloride: 105 mmol/L (ref 96–106)
Chol/HDL Ratio: 4 ratio (ref 0.0–4.4)
Cholesterol, Total: 175 mg/dL (ref 100–199)
Creatinine, Ser: 0.93 mg/dL (ref 0.57–1.00)
EOS (ABSOLUTE): 0.1 10*3/uL (ref 0.0–0.4)
Eos: 1 %
Estimated CHD Risk: 0.8 times avg. (ref 0.0–1.0)
Free Thyroxine Index: 1.9 (ref 1.2–4.9)
GFR calc non Af Amer: 84 mL/min/{1.73_m2} (ref >59)
GFR, EST AFRICAN AMERICAN: 97 mL/min/{1.73_m2} (ref >59)
GGT: 7 IU/L (ref 0–60)
GLOBULIN, TOTAL: 2.9 g/dL (ref 1.5–4.5)
GLUCOSE: 85 mg/dL (ref 65–99)
HDL: 44 mg/dL (ref >39)
HEMATOCRIT: 40.9 % (ref 34.0–46.6)
Hemoglobin: 13.9 g/dL (ref 11.1–15.9)
Immature Grans (Abs): 0 10*3/uL (ref 0.0–0.1)
Immature Granulocytes: 0 %
Iron: 130 ug/dL (ref 27–159)
LDH: 167 IU/L (ref 119–226)
LDL CALC: 110 mg/dL — AB (ref 0–99)
LYMPHS: 30 %
Lymphocytes Absolute: 1.8 10*3/uL (ref 0.7–3.1)
MCH: 30.8 pg (ref 26.6–33.0)
MCHC: 34 g/dL (ref 31.5–35.7)
MCV: 91 fL (ref 79–97)
MONOCYTES: 5 %
Monocytes Absolute: 0.3 10*3/uL (ref 0.1–0.9)
NEUTROS ABS: 3.9 10*3/uL (ref 1.4–7.0)
Neutrophils: 64 %
POTASSIUM: 4.6 mmol/L (ref 3.5–5.2)
Phosphorus: 3.5 mg/dL (ref 2.5–4.5)
Platelets: 255 10*3/uL (ref 150–379)
RBC: 4.52 x10E6/uL (ref 3.77–5.28)
RDW: 12.8 % (ref 12.3–15.4)
SODIUM: 142 mmol/L (ref 134–144)
T3 Uptake Ratio: 19 % — ABNORMAL LOW (ref 24–39)
T4, Total: 9.8 ug/dL (ref 4.5–12.0)
TRIGLYCERIDES: 107 mg/dL (ref 0–149)
TSH: 2.06 u[IU]/mL (ref 0.450–4.500)
Total Protein: 7.4 g/dL (ref 6.0–8.5)
Uric Acid: 4.1 mg/dL (ref 2.5–7.1)
VLDL Cholesterol Cal: 21 mg/dL (ref 5–40)
WBC: 6.1 10*3/uL (ref 3.4–10.8)

## 2017-07-21 NOTE — Progress Notes (Signed)
Will you call this patient and inform her that her lab results look normal with a few exceptions?  Her alkaline phosphatase is slightly decreased at 36.  The normal values between 39 and 117.  This may not be significant but I would like her to follow-up with her primary care provider regarding this because this could be due to nutrition or rarely other disorders.  Her LDL cholesterol ("bad cholesterol") is elevated at 110.  Normal value for this is between 0 and 99.  Would like her to follow-up with her primary care provider regarding this.  Her T3 uptake ratio is slightly decreased at 19%.  The normal value is between 24 and 39%.  This is likely due to the birth control she is taking but should also be followed up by her primary care provider.

## 2017-08-06 IMAGING — US US OB LIMITED
1 series · 14 of 19 positions shown · non-contrast
Comparison: none

CLINICAL DATA: Vaginal bleeding for 3 days. Assigned gestational
age of 23 weeks 1 day.

EXAM:
LIMITED OBSTETRIC ULTRASOUND

[Series 1: us ob limited · 0.23mm/px · 14 of 19 slices shown]
[im 1/19]
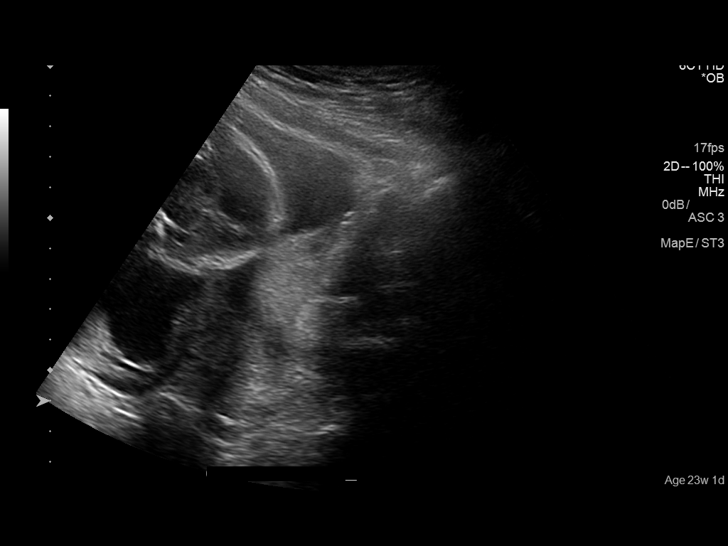
[im 3/19]
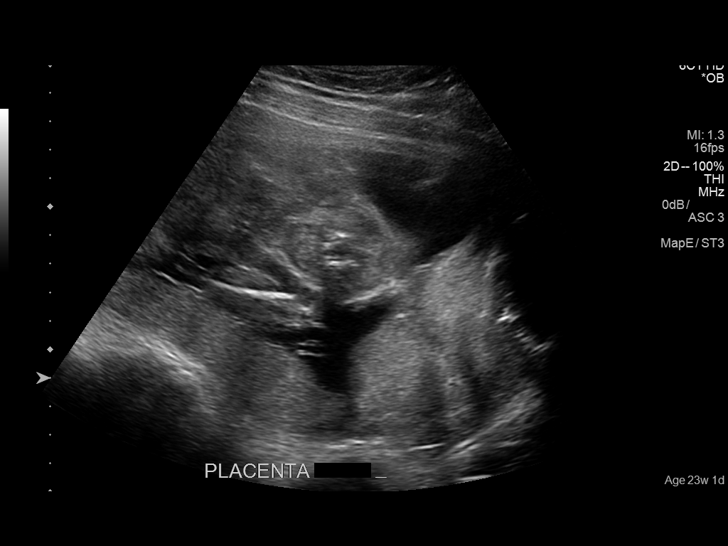
[im 4/19]
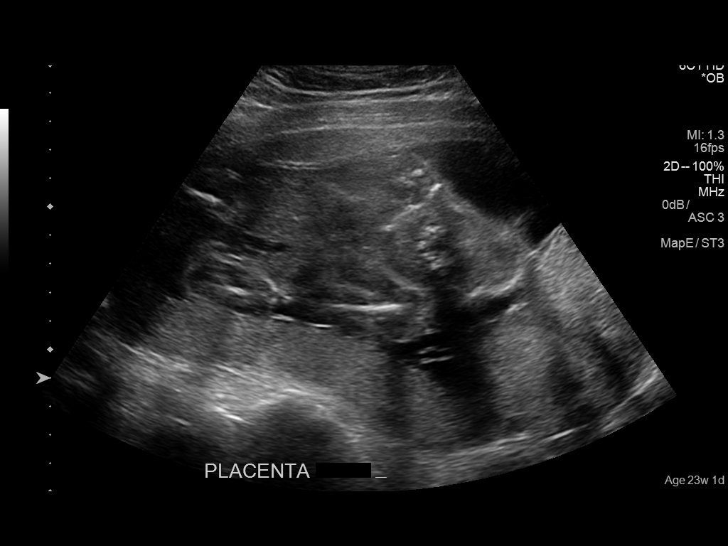
[im 5/19]
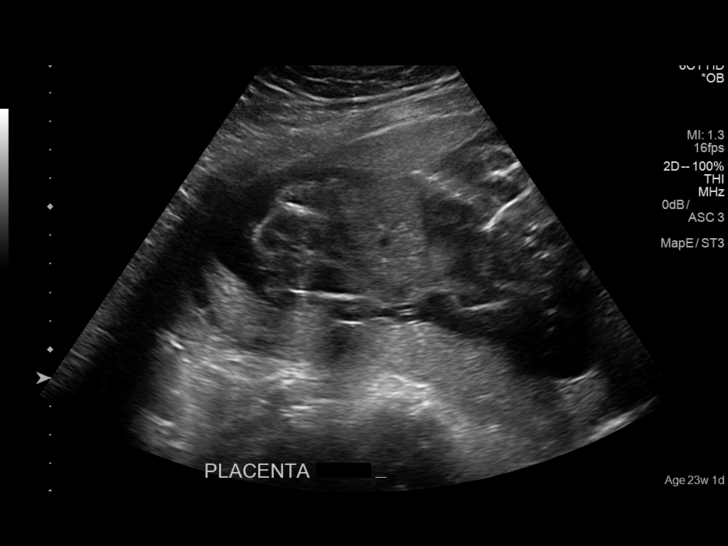
[im 7/19]
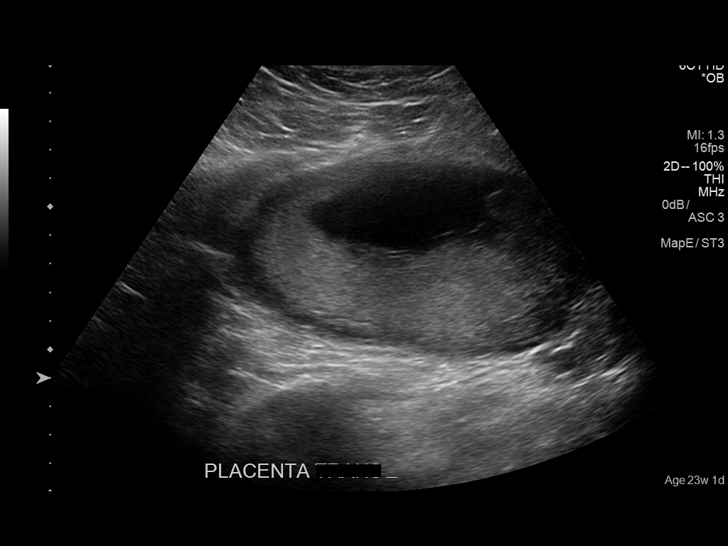
[im 8/19]
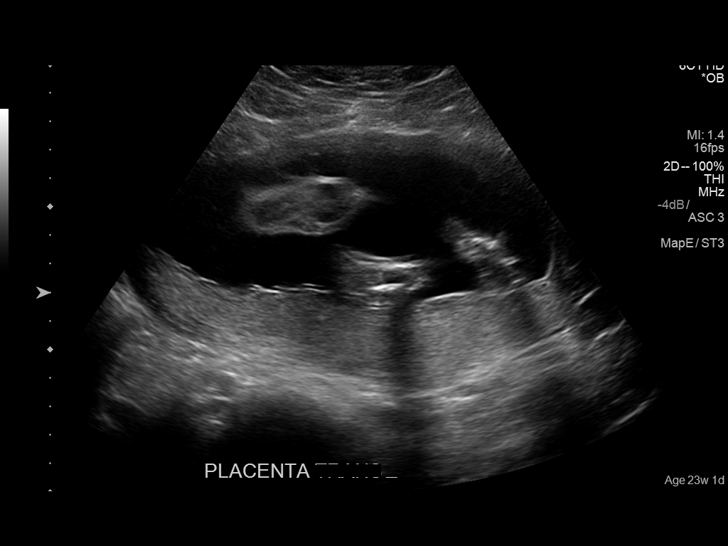
[im 9/19]
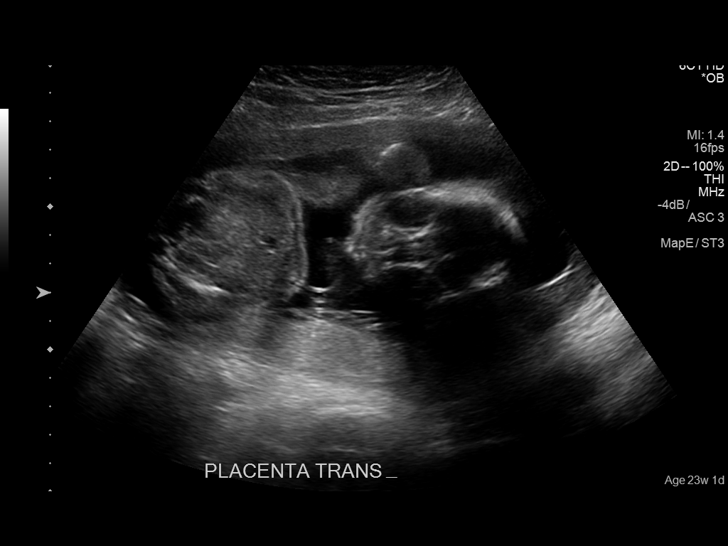
[im 11/19]
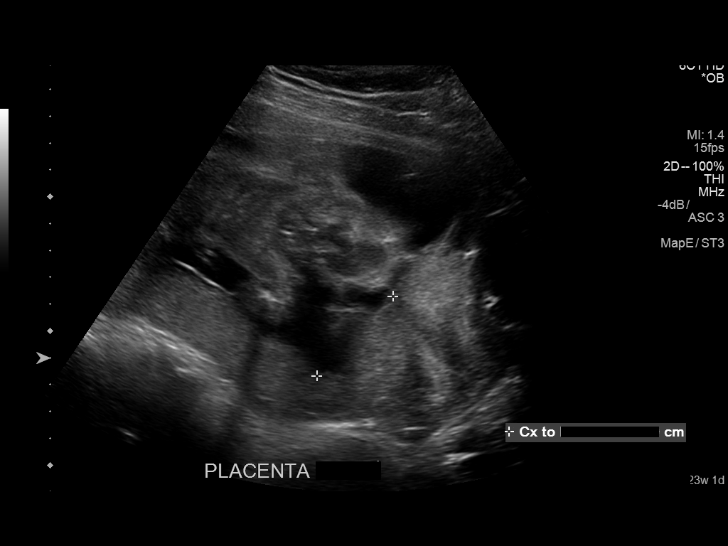
[im 12/19]
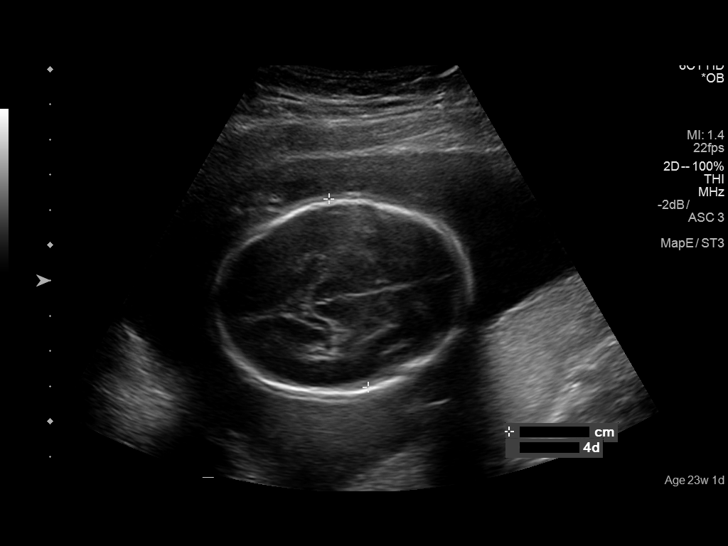
[im 13/19]
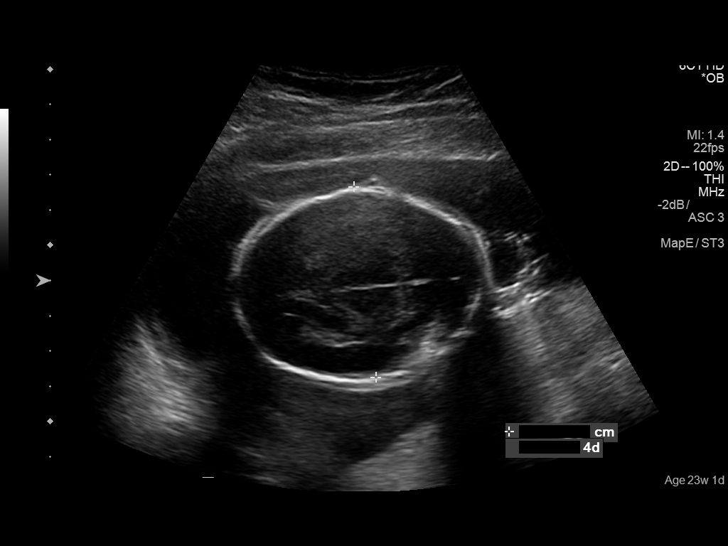
[im 15/19]
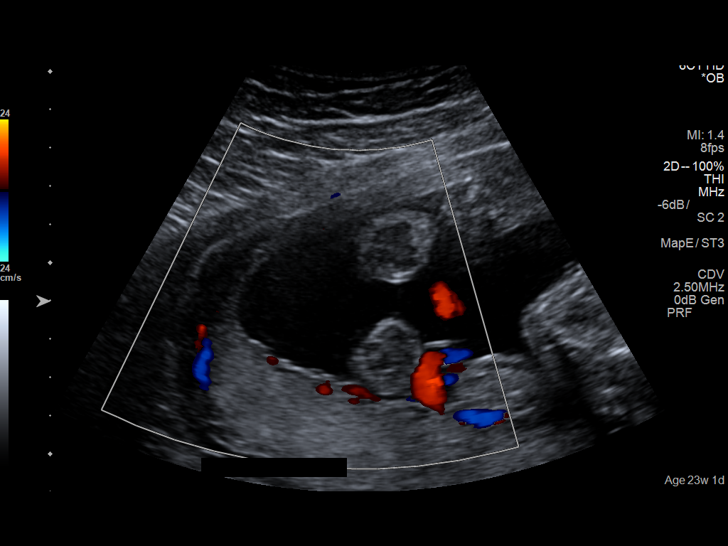
[im 16/19]
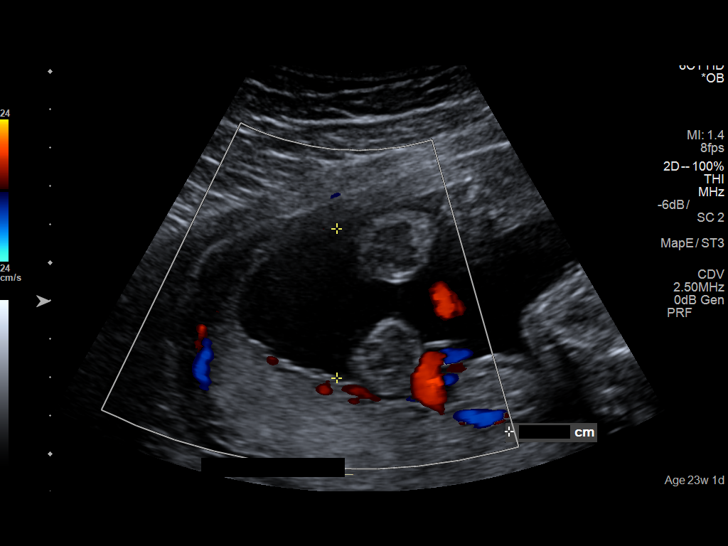
[im 17/19]
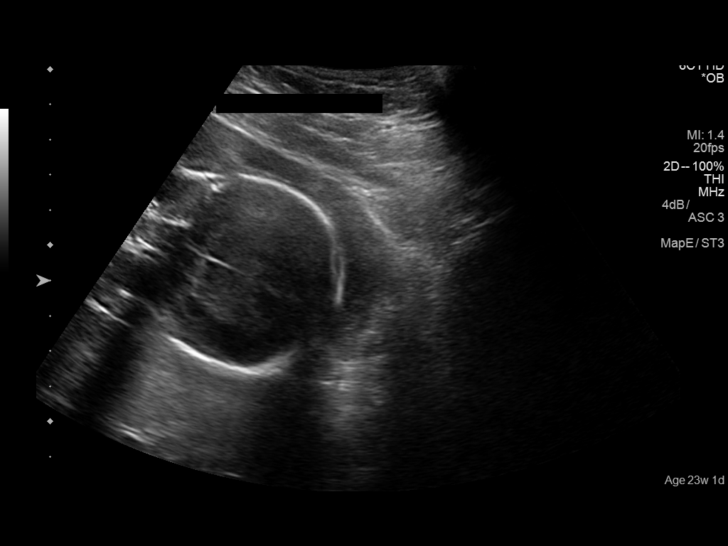
[im 19/19]
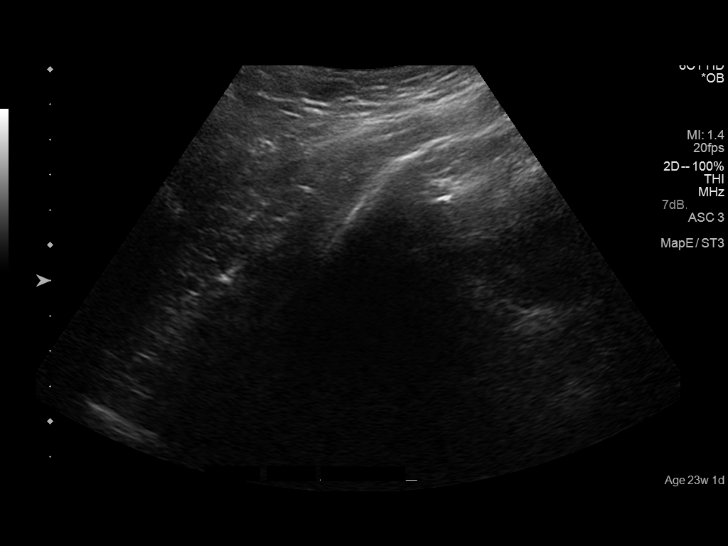

[14 of 19 positions shown; findings below may reference images not displayed]

FINDINGS: Number of Fetuses: 1

Heart Rate:  163 bpm

Movement: Yes

Presentation: Cephalic

Placental Location: Posterior

Previa: No

Amniotic Fluid (Subjective):  Within normal limits.

BPD:  5.5cm 22w  4d

MATERNAL FINDINGS:

Cervix:  Appears closed.

Uterus/Adnexae:  No abnormality visualized.
IMPRESSION: Single living intrauterine fetus in cephalic presentation. No
placenta previa or abruption visualized.

This exam is performed on an emergent basis and does not
comprehensively evaluate fetal size, dating, or anatomy; follow-up
complete OB US should be considered if further fetal assessment is
warranted.

## 2017-12-17 ENCOUNTER — Encounter: Payer: Self-pay | Admitting: Family Medicine

## 2017-12-20 ENCOUNTER — Other Ambulatory Visit: Payer: Self-pay | Admitting: Family Medicine

## 2017-12-20 DIAGNOSIS — K649 Unspecified hemorrhoids: Secondary | ICD-10-CM

## 2017-12-20 NOTE — Progress Notes (Signed)
Patient requesting referral to gastroenterology for hemorrhoids and rectal pressure.  Referral placed electronically and Carollee HerterShannon, CMA will call [facility] to ensure that they received the referral.

## 2018-12-01 ENCOUNTER — Encounter: Payer: Self-pay | Admitting: Adult Health

## 2018-12-01 ENCOUNTER — Ambulatory Visit: Payer: Managed Care, Other (non HMO) | Admitting: Adult Health

## 2018-12-01 ENCOUNTER — Other Ambulatory Visit: Payer: Self-pay

## 2018-12-01 VITALS — BP 118/74 | HR 63 | Temp 98.8°F | Resp 16 | Ht 69.0 in | Wt 176.0 lb

## 2018-12-01 DIAGNOSIS — Z008 Encounter for other general examination: Secondary | ICD-10-CM | POA: Diagnosis not present

## 2018-12-01 DIAGNOSIS — Z0189 Encounter for other specified special examinations: Secondary | ICD-10-CM | POA: Diagnosis not present

## 2018-12-01 DIAGNOSIS — K5900 Constipation, unspecified: Secondary | ICD-10-CM

## 2018-12-01 NOTE — Patient Instructions (Signed)
Chronic Constipation  Chronic constipation is a condition in which a person has three or fewer bowel movements a week, for three months or longer. This condition is especially common in older adults. The two main kinds of chronic constipation are secondary constipation and functional constipation. Secondary constipation results from another condition or a treatment. Functional constipation, also called primary or idiopathic constipation, is divided into three types:  Normal transit constipation. In this type, movement of stool through the colon (stool transit) occurs normally.  Slow transit constipation. In this type, stool moves slowly through the colon.  Outlet constipation or pelvic floor dysfunction. In this type, the nerves and muscles that empty the rectum do not work normally. What are the causes? Causes of secondary constipation may include:  Failing to drink enough fluid, eat enough food or fiber, or get physically active.  Pregnancy.  A tear in the anus (anal fissure).  Blockage in the bowel (bowel obstruction).  Narrowing of the bowel (bowel stricture).  Having a long-term medical condition, such as: ? Diabetes. ? Hypothyroidism. ? Multiple sclerosis. ? Parkinson disease. ? Stroke. ? Spinal cord injury. ? Dementia. ? Colon cancer. ? Inflammatory bowel disease (IBD). ? Iron-deficiency anemia. ? Outward collapse of the rectum (rectal prolapse). ? Hemorrhoids.  Taking certain medicines, including: ? Narcotics. These are a certain type of prescription pain medicine. ? Antacids. ? Iron supplements. ? Water pills (diuretics). ? Certain blood pressure medicines. ? Anti-seizure medicines. ? Antidepressants. ? Medicines for Parkinson disease. The cause of functional constipation is not known, but some conditions are associated with it. These conditions include:  Stress.  Problems in the nerves and muscles that control stool transit.  Weak or impaired pelvic  floor muscles. What increases the risk? You may be at higher risk for chronic constipation if you:  Are older than age 25.  Are female.  Live in a long-term care facility.  Do not get much exercise or physical activity (have a sedentary lifestyle).  Do not drink enough fluids.  Do not eat enough food, especially fiber.  Have a long-term disease.  Have a mental health disorder or eating disorder.  Take many medicines. What are the signs or symptoms? The main symptom of chronic constipation is having three or fewer bowel movements a week for several weeks. Other signs and symptoms may vary from person to person. These include:  Pushing hard (straining) to pass stool.  Painful bowel movements.  Having hard or lumpy stools.  Having lower belly discomfort, such as cramps or bloating.  Being unable to have a bowel movement when you feel the urge.  Feeling like you still need to pass stool after a bowel movement.  Feeling that you have something in your rectum that is blocking or preventing bowel movements.  Seeing blood on the toilet paper or in your stool.  Worsening confusion (in older adults). How is this diagnosed? This condition may be diagnosed based on:  Symptoms and medical history. You will be asked about your symptoms, lifestyle, diet, and any medicines that you are taking.  Physical exam. ? Your belly (abdomen) will be examined. ? A digital rectal exam may be done. For this exam, a health care provider places a lubricated, gloved finger into the rectum.  Other tests to check for any underlying causes of your constipation. These may be ordered if you have bleeding in your rectum, weight loss, or a family history of colon cancer. In these cases, you may have: ? Imaging  studies of the colon. These may include X-ray, ultrasound, or CT scan. ? Blood tests. ? A procedure to examine the inside of your colon (colonoscopy). ? More specialized tests to  check:  Whether your anal sphincter works well. This is a ring-shaped muscle that controls the closing of the anus.  How well food moves through your colon. ? Tests to measure the nerve signal in your pelvic floor muscles (electromyography). How is this treated? Treatment for chronic constipation depends on the cause. Most often, treatment starts with:  Being more active and getting regular exercise.  Drinking more fluids.  Adding fiber to your diet. Sources of fiber include fruits, vegetables, whole grains, and fiber supplements.  Using medicines such as stool softeners or medicines that increase contractions in your digestive system (pro-motility agents).  Training your pelvic muscles with biofeedback.  Surgery, if there is obstruction. Treatment for secondary chronic constipation depends on the underlying condition. You may need to:  Stop or change some medicines if they cause constipation.  Use a fiber supplement (bulk laxative) or stool softener.  Use prescription laxative. This works by [employer] Companyabsorbing water into your colon (osmotic laxative). You may also need to see a specialist who treats conditions of the digestive system (gastroenterologist). Follow these instructions at home:   Take over-the-counter and prescription medicines only as told by your health care provider.  If you are taking a laxative, take it as told by your health care provider.  Eat a balanced diet that includes enough fiber. Ask your health care provider to recommend a diet that is right for you.  Drink clear fluids, especially water. Avoid drinking alcohol, caffeine, and soda.  Drink enough fluid to keep your urine pale yellow.  Get some physical activity every day. Ask your health care provider what physical activities are safe for you.  Get colon cancer screenings as told by your health care provider.  Keep all follow-up visits as told by your health care provider. This is important. Contact a  health care provider if:  You are having three or fewer bowel movements a week.  Your stools are hard or lumpy.  You notice blood on the toilet paper or in your stool after you have a bowel movement.  You have unexplained weight loss.  You have rectum (rectal) pain.  You have stool leakage.  You experience nausea or vomiting. Get help right away if:  You have rectal bleeding or you pass blood clots.  You have severe rectal pain.  You have body tissue that pushes out (protrudes) from your anus.  You have severe pain or bloating (distension) in your abdomen.  You have vomiting that you cannot control. Summary  Chronic constipation is a condition in which a person has three or fewer bowel movements a week, for three months or longer.  You may have a higher risk for this condition if you are an older adult, or if you do not drink enough water or get enough physical activity (are sedentary).  Treatment for this condition depends on the cause. Most treatments for chronic constipation include adding fiber to your diet, drinking more fluids, and getting more physical activity. You may also need to treat any underlying medical conditions or stop or change certain medicines if they cause constipation.  If lifestyle changes do not relieve constipation, your health care provider may recommend taking a laxative. This information is not intended to replace advice given to you by your health care provider. Make sure you discuss any  questions you have with your health care provider. Document Released: 12/01/2016 Document Revised: 04/16/2017 Document Reviewed: 01/19/2017 Elsevier Patient Education  2020 [employer].  Constipation, Adult Constipation is when a person:  Poops (has a bowel movement) fewer times in a week than normal.  Has a hard time pooping.  Has poop that is dry, hard, or bigger than normal. Follow these instructions at home: Eating and drinking   Eat foods that  have a lot of fiber, such as: ? Fresh fruits and vegetables. ? Whole grains. ? Beans.  Eat less of foods that are high in fat, low in fiber, or overly processed, such as: ? [place] fries. ? Hamburgers. ? Cookies. ? Candy. ? Soda.  Drink enough fluid to keep your pee (urine) clear or pale yellow. General instructions  Exercise regularly or as told by your doctor.  Go to the restroom when you feel like you need to poop. Do not hold it in.  Take over-the-counter and prescription medicines only as told by your doctor. These include any fiber supplements.  Do pelvic floor retraining exercises, such as: ? Doing deep breathing while relaxing your lower belly (abdomen). ? Relaxing your pelvic floor while pooping.  Watch your condition for any changes.  Keep all follow-up visits as told by your doctor. This is important. Contact a doctor if:  You have pain that gets worse.  You have a fever.  You have not pooped for 4 days.  You throw up (vomit).  You are not hungry.  You lose weight.  You are bleeding from the anus.  You have thin, pencil-like poop (stool). Get help right away if:  You have a fever, and your symptoms suddenly get worse.  You leak poop or have blood in your poop.  Your belly feels hard or bigger than normal (is bloated).  You have very bad belly pain.  You feel dizzy or you faint. This information is not intended to replace advice given to you by your health care provider. Make sure you discuss any questions you have with your health care provider. Document Released: 10/21/2007 Document Revised: 04/16/2017 Document Reviewed: 10/23/2015 Elsevier Patient Education  2020 [employer].  The Biometric exam is a brief physical with labs including glucose and cholesterol. This does not replace a full physical with a primary care provider, and additional recommended labs. This is an acute care clinic not for maintenance of chronic or long standing  conditions.   Provider also recommends if you do not have a primary care provider for patient to establish care promptly.You can choose any provider of your choice at any facility of your choice, the below information is  just a resource to aid in you finding a primary care provider for routine health maintenance.   [facility]  PHYSICIAN/PROVIDER  REFERRAL LINE at [phone]  [email or web address] to help assist with finding a primary care doctor.   Helpful resources below of other primary care office's accepting new patients.    [facility]         [address]  [place]. [place] [address] [phone]   [facility]    [address], [address] [place], [place] [address] [phone]   [facility] [address]  [place], [place]  [phone]    [facility]  [address], [address] [place] [address] [phone]    Follow up with primary care as needed  for chronic and maintenance health care- can be seen in this employee clinic for acute care.    Health Maintenance, Female Adopting a healthy lifestyle and getting preventive care are important in promoting health and wellness. Ask your health care provider about:  The right schedule for you to have regular tests and exams.  Things you can do on your own to prevent diseases and keep yourself healthy. What should I know about diet, weight, and exercise? Eat a healthy diet   Eat a diet that includes plenty of vegetables, fruits, low-fat dairy products, and lean protein.  Do not eat a lot of foods that are high in solid fats, added sugars, or sodium. Maintain a healthy weight Body mass index (BMI) is used to identify weight problems. It estimates body fat based on height and weight. Your health care provider can help determine your BMI and help you achieve or maintain a healthy weight. Get regular  exercise Get regular exercise. This is one of the most important things you can do for your health. Most adults should:  Exercise for at least 150 minutes each week. The exercise should increase your heart rate and make you sweat (moderate-intensity exercise).  Do strengthening exercises at least twice a week. This is in addition to the moderate-intensity exercise.  Spend less time sitting. Even light physical activity can be beneficial. Watch cholesterol and blood lipids Have your blood tested for lipids and cholesterol at 29 years of age, then have this test every 5 years. Have your cholesterol levels checked more often if:  Your lipid or cholesterol levels are high.  You are older than 29 years of age.  You are at high risk for heart disease. What should I know about cancer screening? Depending on your health history and family history, you may need to have cancer screening at various ages. This may include screening for:  Breast cancer.  Cervical cancer.  Colorectal cancer.  Skin cancer.  Lung cancer. What should I know about heart disease, diabetes, and high blood pressure? Blood pressure and heart disease  High blood pressure causes heart disease and increases the risk of stroke. This is more likely to develop in people who have high blood pressure readings, are of African descent, or are overweight.  Have your blood pressure checked: ? Every 3-5 years if you are 1918-29 years of age. ? Every year if you are 29 years old or older. Diabetes Have regular diabetes screenings. This checks your fasting blood sugar level. Have the screening done:  Once every three years after age 29 if you are at a normal weight and have a low risk for diabetes.  More often and at a younger age if you are overweight or have a high risk for diabetes. What should I know about preventing infection? Hepatitis B If you have a higher risk for hepatitis B, you should be screened for this virus.  Talk with your health care provider to find out if you are at risk for hepatitis B infection. Hepatitis C Testing is recommended for:  Everyone born from [birth year] through 1966.  Anyone with known risk factors for hepatitis C. Sexually transmitted infections (STIs)  Get screened for STIs, including gonorrhea and chlamydia, if: ? You are sexually active and are younger than 29 years of age. ? You are older than 29 years of age and your health care provider tells you that you are at risk for this type of infection. ? Your sexual activity has changed since  you were last screened, and you are at increased risk for chlamydia or gonorrhea. Ask your health care provider if you are at risk.  Ask your health care provider about whether you are at high risk for HIV. Your health care provider may recommend a prescription medicine to help prevent HIV infection. If you choose to take medicine to prevent HIV, you should first get tested for HIV. You should then be tested every 3 months for as long as you are taking the medicine. Pregnancy  If you are about to stop having your period (premenopausal) and you may become pregnant, seek counseling before you get pregnant.  Take 400 to 800 micrograms (mcg) of folic acid every day if you become pregnant.  Ask for birth control (contraception) if you want to prevent pregnancy. Osteoporosis and menopause Osteoporosis is a disease in which the bones lose minerals and strength with aging. This can result in bone fractures. If you are 29 years old or older, or if you are at risk for osteoporosis and fractures, ask your health care provider if you should:  Be screened for bone loss.  Take a calcium or vitamin D supplement to lower your risk of fractures.  Be given hormone replacement therapy (HRT) to treat symptoms of menopause. Follow these instructions at home: Lifestyle  Do not use any products that contain nicotine or tobacco, such as cigarettes, e-cigarettes,  and chewing tobacco. If you need help quitting, ask your health care provider.  Do not use street drugs.  Do not share needles.  Ask your health care provider for help if you need support or information about quitting drugs. Alcohol use  Do not drink alcohol if: ? Your health care provider tells you not to drink. ? You are pregnant, may be pregnant, or are planning to become pregnant.  If you drink alcohol: ? Limit how much you use to 0-1 drink a day. ? Limit intake if you are breastfeeding.  Be aware of how much alcohol is in your drink. In the [place] one drink equals one 12 oz bottle of beer (355 mL), one 5 oz glass of wine (148 mL), or one 1 oz glass of hard liquor (44 mL). General instructions  Schedule regular health, dental, and eye exams.  Stay current with your vaccines.  Tell your health care provider if: ? You often feel depressed. ? You have ever been abused or do not feel safe at home. Summary  Adopting a healthy lifestyle and getting preventive care are important in promoting health and wellness.  Follow your health care provider's instructions about healthy diet, exercising, and getting tested or screened for diseases.  Follow your health care provider's instructions on monitoring your cholesterol and blood pressure. This information is not intended to replace advice given to you by your health care provider. Make sure you discuss any questions you have with your health care provider. Document Released: 11/17/2010 Document Revised: 04/27/2018 Document Reviewed: 04/27/2018 Elsevier Patient Education  2020 [employer]

## 2018-12-01 NOTE — Addendum Note (Signed)
Addended by: Judie Petit on: 12/01/2018 03:23 PM   Modules accepted: Orders

## 2018-12-01 NOTE — Progress Notes (Signed)
[place] DOB: 28 y.o. MRN: [id number]  Subjective:  Here for Biometric Screen/brief exam Patient is a 28 year old female in no acute distress who comes to the clinic for her biometric brief exam and screening.   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   She does have gastrointestinal issues she reports that started 3 years ago when she had her child. She feels she has some internal hemorrhoid and does not want an exam today just wants to see a female provider in gastrointestinal specialty. She reports she is constipate often and increasing fiber does not help.  She denies dark, tarry stools or any rectal pain or bleeding.  Denies abdominal pain,distension.  Patient  denies any fever, body aches,chills, rash, chest pain, shortness of breath, nausea, vomiting, or diarrhea.    Objective: Blood pressure 118/74, pulse 63, temperature 98.8 F (37.1 C), temperature source Oral, resp. rate 16, height 5\' 9"  (1.753 m), weight 176 lb (79.8 kg), SpO2 99 %. NAD Patient is alert and oriented and responsive to questions Engages in eye contact with provider. Speaks in full sentences without any pauses without any shortness of breath or distress.  HEENT: Within normal limits Neck: Normal, supple, no lymphadenopathy  Heart: Regular rate and rhythm Lungs: Clear to auscultation with no adventitious lung sounds.  Abdomen: soft non tender non distended, bowel sounds normal Rectal : offered exam with chaperon/ patient declined exam.  Patient moves on and off of exam table and in room without difficulty. Gait is normal in hall and in room.   Assessment: Biometric screen   ICD-10-CM   1. Encounter for biometric screening  Z01.89 Glucose, random    Lipid Panel With LDL/HDL Ratio  2. Constipation, unspecified constipation type  K59.00      Plan:  Orders Placed This Encounter  Procedures  . Glucose, random  . Lipid Panel With  LDL/HDL Ratio  . Ambulatory referral to Gastroenterology  Call the office if you have not heard from gastrointestinal MD office for an appointment.   Will refer to gastrointestinal MD - patient prefers a female and declined rectal exam in this office.  Fasting glucose and lipids. Discussed with patient that today's visit here is a limited biometric screening visit (not a comprehensive exam or management of any chronic problems) Discussed some health issues, including healthy eating habits and exercise. Encouraged to follow-up with PCP for annual comprehensive preventive and wellness care (and if applicable, any chronic issues). Questions invited and answered.   Follow up with primary care as needed for chronic and maintenance health care- can be seen in this employee clinic for acute care.

## 2018-12-02 LAB — LIPID PANEL WITH LDL/HDL RATIO
Cholesterol, Total: 181 mg/dL (ref 100–199)
HDL: 52 mg/dL (ref >39)
LDL Calculated: 108 mg/dL — ABNORMAL HIGH (ref 0–99)
LDl/HDL Ratio: 2.1 ratio (ref 0.0–3.2)
Triglycerides: 104 mg/dL (ref 0–149)
VLDL Cholesterol Cal: 21 mg/dL (ref 5–40)

## 2018-12-02 LAB — GLUCOSE, RANDOM: Glucose: 77 mg/dL (ref 65–99)

## 2018-12-02 NOTE — Progress Notes (Signed)
Mrs. Danielle Wood, Your total cholesterol, triglycerides, HDL, ALDL and glucose are all normal. Your LDL ( bad cholesterol) is mildly elevated at 108( normal range is 0-99).  You can increase exercise to 30 minutes daily and also maintain a heart healthy diet  low in saturated fats and cholesterol.  I also sent the referral on 12/02/18 for gastrointestinal MD to Sherri Sear the number to that office is [phone] and that is with [facility] and she is a female provider as you requested.  Follow up with primary care as needed for chronic and maintenance health care- can be seen in this employee clinic for acute care.

## 2018-12-09 ENCOUNTER — Ambulatory Visit: Payer: Managed Care, Other (non HMO) | Admitting: Adult Health

## 2018-12-09 ENCOUNTER — Encounter: Payer: Self-pay | Admitting: Adult Health

## 2018-12-09 ENCOUNTER — Other Ambulatory Visit: Payer: Self-pay

## 2018-12-09 DIAGNOSIS — L03115 Cellulitis of right lower limb: Secondary | ICD-10-CM

## 2018-12-09 DIAGNOSIS — S70361A Insect bite (nonvenomous), right thigh, initial encounter: Secondary | ICD-10-CM | POA: Diagnosis not present

## 2018-12-09 DIAGNOSIS — W57XXXA Bitten or stung by nonvenomous insect and other nonvenomous arthropods, initial encounter: Secondary | ICD-10-CM

## 2018-12-09 MED ORDER — MUPIROCIN 2 % EX OINT: 1.0000 "application " | TOPICAL_OINTMENT | Freq: Three times a day (TID) | CUTANEOUS | 0 refills | Status: DC

## 2018-12-09 MED ORDER — SULFAMETHOXAZOLE-TRIMETHOPRIM 800-160 MG PO TABS: 1.0000 | ORAL_TABLET | Freq: Two times a day (BID) | ORAL | 0 refills | Status: DC

## 2018-12-09 NOTE — Progress Notes (Signed)
Virtual Visit via Telephone Note  I connected with Danielle BendersSamantha E Wood on 12/09/18 at 11:00 AM EDT by telephone and verified that I am speaking with the correct person using two identifiers.  Location: Patient: at home  Provider: [facility], [place] [facility], [place]    I discussed the limitations, risks, security and privacy concerns of performing an evaluation and management service by telephone and the availability of in person appointments. I also discussed with the patient that there may be a patient responsible charge related to this service. The patient expressed understanding and agreed to proceed.   History of Present Illness: Patient is a 28 year old female who calls the clinic for a telephone visit for a possible mosquito bite to her      Right upper thigh.  She sent pictures of area of concern under media. 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 Small amount of drainage was removed. She had it ultrasound of it in the emergency room and the doctor told her it was pus so she then drained it herself.   Onset two days ago 12/07/18. Improving since draining she reports.   Patient  denies any fever, body aches,chills, rash, chest pain, shortness of breath, nausea, vomiting, or diarrhea.   See below My chart questions answered by patient.  Patient  denies any fever, body aches,chills, rash, chest pain, shortness of breath, nausea, vomiting, or diarrhea.   LMP 12/09/18 denies pregnancy    [facility]-Visit Cellulitus  Question 12/09/2018 10:16 AM EDT  Please describe how your skin condition appears. You may choose more than one. Swelling (raised region)   Redness  What areas are affected? Other  Location of Cellulitus comments [place] thigh  Did you previously have; Insect bite  Do you have a history  of; (choose all that apply) None of the above  Do you have a fever? No, I do not have a fever  Do you have chills? No  Do you have swollen glands? No  Does the affected area itch? No  Is the area painful? Yes  Please rate your pain on a scale of 1-10. 1 being the least and 10 being the worst. 2  How long have you been having these symptoms? 2 days  Have you had a tick bite in the last 2 weeks? No  Have you tried any over-the-counter medications? Hydrocortisone Ointment 1%  Did any of the medications help? No  Please list your medication allergies that you may have ? (If 'none' , please list as 'none') NKDA  Please list any additional comments    Are you pregnant? I am confident that I am not pregnant  Are you breastfeeding? No    Observations/Objective: afebrile. No other vitals available.   Patient is alert and oriented and responsive to questions Engages in conversation with provider. Speaks in full sentences without any pauses without any shortness of breath or distress.   See media tab for patient submitted pictures. . 2nd picture was after area was lanced by patient. 1st picture is today.   Assessment and Plan:  1. Cellulitis of right lower extremity   2. Insect bite of right thigh, initial encounter    Meds ordered this encounter  Medications  . sulfamethoxazole-trimethoprim (BACTRIM DS) 800-160 MG tablet    Sig:  Take 1 tablet by mouth 2 (two) times daily.    Dispense:  20 tablet    Refill:  0  . mupirocin ointment (BACTROBAN) 2 %    Sig: Apply 1 application topically 3 (three) times daily.    Dispense:  22 g    Refill:  0    Follow Up Instructions:  Discussed medications above, she preferred Bactrim over clindamycin due to her Gastrointestinal chronic issues of which she has a appointment with GI. No new symptoms or current systems.   RED FLAGS to be seen immediately were discussed for after hours care or when to call the clinic when open.  Advised patient call the  office or your primary care doctor for an appointment if no improvement within 72 hours or if any symptoms change or worsen at any time  Advised ER or urgent Care if after hours or on weekend. Call 911 for emergency symptoms at any time.Patinet verbalized understanding of all instructions given/reviewed and treatment plan and has no further questions or concerns at this time.     I discussed the assessment and treatment plan with the patient. The patient was provided an opportunity to ask questions and all were answered. The patient agreed with the plan and demonstrated an understanding of the instructions.   The patient was advised to call back or seek an in-person evaluation if the symptoms worsen or if the condition fails to improve as anticipated.  I provided 15 minutes of non-face-to-face time during this encounter.   Marcille Buffy, FNP

## 2018-12-09 NOTE — Patient Instructions (Signed)
Insect Bite, Adult An insect bite can make your skin red, itchy, and swollen. Some insects can spread disease to people with a bite. However, most insect bites do not lead to disease, and most are not serious. What are the causes? Insects may bite for many reasons, including:  Hunger.  To defend themselves. Insects that bite include:  Spiders.  Mosquitoes.  Ticks.  Fleas.  Ants.  Flies.  Kissing bugs.  Chiggers. What are the signs or symptoms? Symptoms of this condition include:  Itching or pain in the bite area.  Redness and swelling in the bite area.  An open wound (skin ulcer). Symptoms often last for 2-4 days. In rare cases, a person may have a very bad allergic reaction (anaphylactic reaction) to a bite. Symptoms of an anaphylactic reaction may include:  Feeling warm in the face (flushed). Your face may turn red.  Itchy, red, swollen areas of skin (hives).  Swelling of the: ? Eyes. ? Lips. ? Face. ? Mouth. ? Tongue. ? Throat.  Trouble with any of these: ? Breathing. ? Talking. ? Swallowing.  Loud breathing (wheezing).  Feeling dizzy or light-headed.  Passing out (fainting).  Pain or cramps in your belly.  Throwing up (vomiting).  Watery poop (diarrhea). How is this treated? Treatment is usually not needed. Symptoms often go away on their own. When treatment is needed, it may involve:  Putting a cream or lotion on the bite area. This helps with itching.  Taking an antibiotic medicine. This treatment is needed if the bite area gets infected.  Getting a tetanus shot, if you are not up to date on this vaccine.  Putting ice on the affected area.  Using medicines called antihistamines. This treatment may be needed if you have itching or an allergic reaction to the insect bite.  Giving yourself a shot of medicine (epinephrine) using an auto-injector "pen" if you have an anaphylactic reaction to a bite. Your doctor will teach you how to use  this pen. Follow these instructions at home: Bite area care   Do not scratch the bite area.  Keep the bite area clean and dry.  Wash the bite area every day with soap and water as told by your doctor.  Check the bite area every day for signs of infection. Check for: ? Redness, swelling, or pain. ? Fluid or blood. ? Warmth. ? Pus or a bad smell. Managing pain, itching, and swelling   You may put any of these on the bite area as told by your doctor: ? A paste made of baking soda and water. ? Cortisone cream. ? Calamine lotion.  If told, put ice on the bite area. ? Put ice in a plastic bag. ? Place a towel between your skin and the bag. ? Leave the ice on for 20 minutes, 2-3 times a day. General instructions  Apply or take over-the-counter and prescription medicines only as told by your doctor.  If you were prescribed an antibiotic medicine, take or apply it as told by your doctor. Do not stop using the antibiotic even if your condition improves.  Keep all follow-up visits as told by your doctor. This is important. How is this prevented? To help you have a lower risk of insect bites:  When you are outside, wear clothing that covers your arms and legs.  Use insect repellent. The best insect repellents contain one of these: ? DEET. ? Picaridin. ? Oil of lemon eucalyptus (OLE). ? [id number].  Consider spraying your  clothing with a pesticide called permethrin. Permethrin helps prevent insect bites. It works for several weeks and for up to 5-6 clothing washes. Do not apply permethrin directly to the skin.  If your home windows do not have screens, think about putting some in.  If you will be sleeping in an area where there are mosquitoes, consider covering your sleeping area with a mosquito net. Contact a doctor if:  You have redness, swelling, or pain in the bite area.  You have fluid or blood coming from the bite area.  The bite area feels warm to the touch.  You have  pus or a bad smell coming from the bite area.  You have a fever. Get help right away if:  You have joint pain.  You have a rash.  You feel more tired or sleepy than you normally do.  You have neck pain.  You have a headache.  You feel weaker than you normally do.  You have signs of an anaphylactic reaction. Signs may include: ? Feeling warm in the face. ? Itchy, red, swollen areas of skin. ? Swelling of your:  Eyes.  Lips.  Face.  Mouth.  Tongue.  Throat. ? Trouble with any of these:  Breathing.  Talking.  Swallowing. ? Loud breathing. ? Feeling dizzy or light-headed. ? Passing out. ? Pain or cramps in your belly. ? Throwing up. ? Watery poop. These symptoms may be an emergency. Do not wait to see if the symptoms will go away. Do this right away:  Use your auto-injector pen as you have been told.  Get medical help. Call your local emergency services (911 in the [place] Do not drive yourself to the hospital. Summary  An insect bite can make your skin red, itchy, and swollen.  Treatment is usually not needed. Symptoms often go away on their own.  Do not scratch the bite area. Keep it clean and dry.  Ice can help with pain and itching from the bite. This information is not intended to replace advice given to you by your health care provider. Make sure you discuss any questions you have with your health care provider. Document Released: 05/01/2000 Document Revised: 11/12/2017 Document Reviewed: 11/12/2017 Elsevier Patient Education  2020 Elsevier Inc. Cellulitis, Adult  Cellulitis is a skin infection. The infected area is often warm, red, swollen, and sore. It occurs most often in the arms and lower legs. It is very important to get treated for this condition. What are the causes? This condition is caused by bacteria. The bacteria enter through a break in the skin, such as a cut, burn, insect bite, open sore, or crack. What increases the risk? This  condition is more likely to occur in people who:  Have a weak body defense system (immune system).  Have open cuts, burns, bites, or scrapes on the skin.  Are older than 29 years of age.  Have a blood sugar problem (diabetes).  Have a long-lasting (chronic) liver disease (cirrhosis) or kidney disease.  Are very overweight (obese).  Have a skin problem, such as: ? Itchy rash (eczema). ? Slow movement of blood in the veins (venous stasis). ? Fluid buildup below the skin (edema).  Have been treated with high-energy rays (radiation).  Use IV drugs. What are the signs or symptoms? Symptoms of this condition include:  Skin that is: ? Red. ? Streaking. ? Spotting. ? Swollen. ? Sore or painful when you touch it. ? Warm.  A fever.  Chills.  Blisters.  How is this diagnosed? This condition is diagnosed based on:  Medical history.  Physical exam.  Blood tests.  Imaging tests. How is this treated? Treatment for this condition may include:  Medicines to treat infections or allergies.  Home care, such as: ? Rest. ? Placing cold or warm cloths (compresses) on the skin.  Hospital care, if the condition is very bad. Follow these instructions at home: Medicines  Take over-the-counter and prescription medicines only as told by your doctor.  If you were prescribed an antibiotic medicine, take it as told by your doctor. Do not stop taking it even if you start to feel better. General instructions   Drink enough fluid to keep your pee (urine) pale yellow.  Do not touch or rub the infected area.  Raise (elevate) the infected area above the level of your heart while you are sitting or lying down.  Place cold or warm cloths on the area as told by your doctor.  Keep all follow-up visits as told by your doctor. This is important. Contact a doctor if:  You have a fever.  You do not start to get better after 1-2 days of treatment.  Your bone or joint under the  infected area starts to hurt after the skin has healed.  Your infection comes back. This can happen in the same area or another area.  You have a swollen bump in the area.  You have new symptoms.  You feel ill and have muscle aches and pains. Get help right away if:  Your symptoms get worse.  You feel very sleepy.  You throw up (vomit) or have watery poop (diarrhea) for a long time.  You see red streaks coming from the area.  Your red area gets larger.  Your red area turns dark in color. These symptoms may represent a serious problem that is an emergency. Do not wait to see if the symptoms will go away. Get medical help right away. Call your local emergency services (911 in the [place] Do not drive yourself to the hospital. Summary  Cellulitis is a skin infection. The area is often warm, red, swollen, and sore.  This condition is treated with medicines, rest, and cold and warm cloths.  Take all medicines only as told by your doctor.  Tell your doctor if symptoms do not start to get better after 1-2 days of treatment. This information is not intended to replace advice given to you by your health care provider. Make sure you discuss any questions you have with your health care provider. Document Released: 10/21/2007 Document Revised: 09/23/2017 Document Reviewed: 09/23/2017 Elsevier Patient Education  [place]. Mupirocin skin cream or ointment What is this medicine? MUPIROCIN (myoo PEER oh sin) is an antibiotic. It is used on the skin to treat skin infections. This medicine may be used for other purposes; ask your health care provider or pharmacist if you have questions. COMMON BRAND NAME(S): Bactroban, Centany, Centany AT What should I tell my health care provider before I take this medicine? They need to know if you have any of these conditions:  an unusual or allergic reaction to mupirocin, polyethylene glycol (PEG), or other topical antibiotic  medicine  pregnant or trying to get pregnant  breast-feeding How should I use this medicine? This medicine is for external use only. Follow the directions on the prescription label. Wash your hands before and after use. Before applying, wash the affected area with mild soap and water and pat dry. Apply a  small amount to the affected area and rub gently. You can cover the area with a gauze dressing. Do not get this medicine in your eyes. If you do, rinse out with plenty of cool tap water. Do not use your medicine more often than directed. Finish the full course of medicine prescribed by your doctor or health care professional even if you think your condition is better. Do not use over large areas of burnt skin. Talk to your pediatrician regarding the use of this medicine in children. Special care may be needed. Overdosage: If you think you have taken too much of this medicine contact a poison control center or emergency room at once. NOTE: This medicine is only for you. Do not share this medicine with others. What if I miss a dose? If you miss a dose, take it as soon as you can. If it is almost time for your next dose, take only that dose. Do not take double or extra doses. What may interact with this medicine? Interactions are not expected. Do not use any other skin products on the affected area without telling your doctor or health care professional. This list may not describe all possible interactions. Give your health care provider a list of all the medicines, herbs, non-prescription drugs, or dietary supplements you use. Also tell them if you smoke, drink alcohol, or use illegal drugs. Some items may interact with your medicine. What should I watch for while using this medicine? Tell your doctor or health care professional if your skin condition does not begin to improve within 3 to 5 days. What side effects may I notice from receiving this medicine? Side effects that you should report to your  doctor or health care professional as soon as possible:  skin rash, redness, continued swelling, burning, itching, stinging, or pain Side effects that usually do not require medical attention (report to your doctor or health care professional if they continue or are bothersome):  dry skin, itching This list may not describe all possible side effects. Call your doctor for medical advice about side effects. You may report side effects to FDA at 1-800-FDA-1088. Where should I keep my medicine? Keep out of the reach of children. Store at room temperature between 20 and 25 degrees C (68 and 77 degrees F). Throw away any unused medicine after the expiration date. NOTE: This sheet is a summary. It may not cover all possible information. If you have questions about this medicine, talk to your doctor, pharmacist, or health care provider.  2020 Elsevier/Gold Standard (2007-11-21 14:38:18) Sulfamethoxazole; Trimethoprim, SMX-TMP tablets What is this medicine? SULFAMETHOXAZOLE; TRIMETHOPRIM or SMX-TMP (suhl fuh meth OK suh zohl; trye METH oh prim) is a combination of a sulfonamide antibiotic and a second antibiotic, trimethoprim. It is used to treat or prevent certain kinds of bacterial infections. It will not work for colds, flu, or other viral infections. This medicine may be used for other purposes; ask your health care provider or pharmacist if you have questions. COMMON BRAND NAME(S): Bacter-Aid DS, Bactrim, Bactrim DS, Septra, Septra DS What should I tell my health care provider before I take this medicine? They need to know if you have any of these conditions:  anemia  asthma  being treated with anticonvulsants  if you frequently drink alcohol containing drinks  kidney disease  liver disease  low level of folic acid or [id number]-6-phosphate dehydrogenase  poor nutrition or malabsorption  porphyria  severe allergies  thyroid disorder  an unusual or allergic reaction  to  sulfamethoxazole, trimethoprim, sulfa drugs, other medicines, foods, dyes, or preservatives  pregnant or trying to get pregnant  breast-feeding How should I use this medicine? Take this medicine by mouth with a full glass of water. Follow the directions on the prescription label. Take your medicine at regular intervals. Do not take it more often than directed. Do not skip doses or stop your medicine early. Talk to your pediatrician regarding the use of this medicine in children. Special care may be needed. This medicine has been used in children as young as 1 months of age. Overdosage: If you think you have taken too much of this medicine contact a poison control center or emergency room at once. NOTE: This medicine is only for you. Do not share this medicine with others. What if I miss a dose? If you miss a dose, take it as soon as you can. If it is almost time for your next dose, take only that dose. Do not take double or extra doses. What may interact with this medicine? Do not take this medicine with any of the following medications:  aminobenzoate potassium  dofetilide  metronidazole This medicine may also interact with the following medications:  ACE inhibitors like benazepril, enalapril, lisinopril, and ramipril  birth control pills  cyclosporine  digoxin  diuretics  indomethacin  medicines for diabetes  methenamine  methotrexate  phenytoin  potassium supplements  pyrimethamine  sulfinpyrazone  tricyclic antidepressants  warfarin This list may not describe all possible interactions. Give your health care provider a list of all the medicines, herbs, non-prescription drugs, or dietary supplements you use. Also tell them if you smoke, drink alcohol, or use illegal drugs. Some items may interact with your medicine. What should I watch for while using this medicine? Tell your doctor or health care professional if your symptoms do not improve. Drink several  glasses of water a day to reduce the risk of kidney problems. Do not treat diarrhea with over the counter products. Contact your doctor if you have diarrhea that lasts more than 2 days or if it is severe and watery. This medicine can make you more sensitive to the sun. Keep out of the sun. If you cannot avoid being in the sun, wear protective clothing and use a sunscreen. Do not use sun lamps or tanning beds/booths. What side effects may I notice from receiving this medicine? Side effects that you should report to your doctor or health care professional as soon as possible:  allergic reactions like skin rash or hives, swelling of the face, lips, or tongue  breathing problems  fever or chills, sore throat  irregular heartbeat, chest pain  joint or muscle pain  pain or difficulty passing urine  red pinpoint spots on skin  redness, blistering, peeling or loosening of the skin, including inside the mouth  unusual bleeding or bruising  unusually weak or tired  yellowing of the eyes or skin Side effects that usually do not require medical attention (report to your doctor or health care professional if they continue or are bothersome):  diarrhea  dizziness  headache  loss of appetite  nausea, vomiting  nervousness This list may not describe all possible side effects. Call your doctor for medical advice about side effects. You may report side effects to FDA at 1-800-FDA-1088. Where should I keep my medicine? Keep out of the reach of children. Store at room temperature between 20 to 25 degrees C (68 to 77 degrees F). Protect from light. Throw away  any unused medicine after the expiration date. NOTE: This sheet is a summary. It may not cover all possible information. If you have questions about this medicine, talk to your doctor, pharmacist, or health care provider.  2020 Elsevier/Gold Standard (2012-12-09 14:38:26)

## 2018-12-13 ENCOUNTER — Ambulatory Visit (INDEPENDENT_AMBULATORY_CARE_PROVIDER_SITE_OTHER): Payer: Managed Care, Other (non HMO) | Admitting: Gastroenterology

## 2018-12-13 ENCOUNTER — Encounter: Payer: Self-pay | Admitting: Gastroenterology

## 2018-12-13 ENCOUNTER — Other Ambulatory Visit: Payer: Self-pay

## 2018-12-13 VITALS — BP 130/75 | HR 105 | Temp 98.1°F

## 2018-12-13 DIAGNOSIS — R140 Abdominal distension (gaseous): Secondary | ICD-10-CM | POA: Diagnosis not present

## 2018-12-13 DIAGNOSIS — K5904 Chronic idiopathic constipation: Secondary | ICD-10-CM | POA: Diagnosis not present

## 2018-12-13 MED ORDER — DESIPRAMINE HCL 25 MG PO TABS: 25.0000 mg | ORAL_TABLET | Freq: Every day | ORAL | 1 refills | Status: DC

## 2018-12-13 NOTE — Progress Notes (Signed)
Danielle Repressohini R Demontre Padin, Wood [address]  [address]  [place], [place] [address]  Main: [phone]  Fax: [phone]    Gastroenterology Consultation  Referring Provider:     Stephanie Wood, Danielle S, F* Primary Care Physician:  Danielle Wood Primary Gastroenterologist:  Dr. Arlyss Repressohini R Tzippy Wood Reason for Consultation:    Chronic constipation        HPI:   Danielle Wood is a 28 y.o. female referred by Dr. Christeen DouglasBeasley, [place], Wood  for consultation & management of chronic constipation.  Patient reports that she has been suffering from constipation since 2017 after her daughter was born.  She reports episodes of constipation lasting for 1 week followed by soft bowel movements for about a week.  Around her menstrual cycles, she experiences abdominal bloating and cramps.  She has tried MiraLAX, change her diet to incorporate more fiber which have not helped much.  She does report bright red blood per rectum on wiping during episodes of constipation.  She denies weight loss.  She does exercise regularly.  Otherwise, she denies abdominal pain.  She has been experiencing panic attacks and notices that her heart rate and blood pressure fluctuates.  She has history of anxiety and takes Xanax as needed.  She also had several episodes of syncope in the past and was evaluated by cardiology, no etiology was found.  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  She said she woke up with panic attack today and was found to have elevated heart rate.  She thinks her symptoms are related to POTS based on her Internet research and wondering if her GI symptoms are related to it.  I explained to her that constipation with significant straining or pushing can initiate vasovagal response and syncope. Her labs are unremarkable including CBC, ferritin, CMP  NSAIDs: None  Antiplts/Anticoagulants/Anti thrombotics: None  GI Procedures: None She denies family history of GI malignancy, celiac, inflammatory bowel  disease She did not have any GI surgeries  Past Medical History:  Diagnosis Date  . Anxiety     Past Surgical History:  Procedure Laterality Date  . TONSILLECTOMY AND ADENOIDECTOMY N/A 01/14/2016   Procedure: TONSILLECTOMY AND ADENOIDECTOMY;  Surgeon: Linus Salmonshapman McQueen, Wood;  Location: [facility] ORS;  Service: ENT;  Laterality: N/A;  . WISDOM TOOTH EXTRACTION  2010   Current Outpatient Medications:  .  ALPRAZolam (XANAX) 0.5 MG tablet, Take 0.5 mg by mouth 2 (two) times daily as needed for anxiety., Disp: , Rfl:  .  mupirocin ointment (BACTROBAN) 2 %, Apply 1 application topically 3 (three) times daily., Disp: 22 g, Rfl: 0 .  norethindrone-ethinyl estradiol-iron (MICROGESTIN FE,GILDESS FE,LOESTRIN FE) 1.5-30 MG-MCG tablet, Take 1 tablet by mouth daily., Disp: , Rfl:  .  sulfamethoxazole-trimethoprim (BACTRIM DS) 800-160 MG tablet, Take 1 tablet by mouth 2 (two) times daily., Disp: 20 tablet, Rfl: 0 .  cyclobenzaprine (FLEXERIL) 5 MG tablet, TAKE ONE TABLET BY MOUTH BEFORE BEDTIME AS NEEDED FOR NECK BACK PAIN SPASM, Disp: , Rfl:  .  desipramine (NORPRAMIN) 25 MG tablet, Take 1 tablet (25 mg total) by mouth at bedtime., Disp: 30 tablet, Rfl: 1 .  meloxicam (MOBIC) 15 MG tablet, TAKE 1 TABLET BY MOUTH ONCE DAILY FOR 10 DAYS, Disp: , Rfl:    Family History  Problem Relation Age of Onset  . Hypertension Father   . Atrial fibrillation Father   . COPD Maternal Grandmother   . Atrial fibrillation Maternal Grandmother   . Heart failure Maternal  Grandfather   . Breast cancer Paternal Grandmother      Social History   Tobacco Use  . Smoking status: Never Smoker  . Smokeless tobacco: Never Used  Substance Use Topics  . Alcohol use: No  . Drug use: No    Allergies as of 12/13/2018  . (No Known Allergies)    Review of Systems:    All systems reviewed and negative except where noted in HPI.   Physical Exam:  BP 130/75 (BP Location: Right Arm, Patient Position: Sitting)   Pulse (!) 105    Temp 98.1 F (36.7 C) (Oral)   LMP 12/09/2018 (Exact Date)  Patient's last menstrual period was 12/09/2018 (exact date).  General:   Alert,  Well-developed, well-nourished, pleasant and cooperative in NAD Head:  Normocephalic and atraumatic. Eyes:  Sclera clear, no icterus.   Conjunctiva pink. Ears:  Normal auditory acuity. Nose:  No deformity, discharge, or lesions. Mouth:  No deformity or lesions,oropharynx pink & moist. Neck:  Supple; no masses or thyromegaly. Lungs:  Respirations even and unlabored.  Clear throughout to auscultation.   No wheezes, crackles, or rhonchi. No acute distress. Heart:  Regular rate and rhythm; no murmurs, clicks, rubs, or gallops. Abdomen:  Normal bowel sounds. Soft, non-tender and non-distended without masses, hepatosplenomegaly or hernias noted.  No guarding or rebound tenderness.   Rectal: Not performed Msk:  Symmetrical without gross deformities. Good, equal movement & strength bilaterally. Pulses:  Normal pulses noted. Extremities:  No clubbing or edema.  No cyanosis. Neurologic:  Alert and oriented x3;  grossly normal neurologically. Skin:  Intact without significant lesions or rashes. No jaundice. Psych:  Alert and cooperative. Normal mood and affect.  Imaging Studies: No abdominal imaging  Assessment and Plan:   Danielle Wood is a 28 y.o. present female with history of anxiety seen in consultation for chronic constipation, intermittent abdominal bloating and cramps around menstrual cycles.  Patient does not meet the criteria for IBS-constipation.  Will manage chronic constipation at this time  Trial of Linzess 72 MCG daily Discussed with her about high-fiber diet Advised her to completely eliminate red meat, chewing gum, any processed foods, artificial sweeteners Will try desipramine 25 mg at bedtime to manage her anxiety  Rectal bleeding Most likely secondary to irritation from the hemorrhoids due to constipation If bleeding persists  despite managing her bowels, will discuss with her about flexible sigmoidoscopy   Follow up in 4 weeks   Cephas Darby, Wood

## 2018-12-13 NOTE — Patient Instructions (Signed)

## 2018-12-14 ENCOUNTER — Encounter: Payer: Self-pay | Admitting: Adult Health

## 2018-12-15 ENCOUNTER — Encounter: Payer: Self-pay | Admitting: Gastroenterology

## 2018-12-15 ENCOUNTER — Encounter: Payer: Self-pay | Admitting: Adult Health

## 2018-12-15 ENCOUNTER — Telehealth: Payer: Self-pay | Admitting: Adult Health

## 2018-12-15 NOTE — Telephone Encounter (Signed)
Patient is a advised to go to [facility] walk in for her symptoms of rash due to insect bite she does not feel it is getting better. Patient  denies any fever, body aches,chills, rash, chest pain, shortness of breath, nausea, vomiting, or diarrhea.   She was advised to go to the emergency room for evaluations of orthostatic changes and syncope she reported on 12/14/18 patient states today " I just refuse to go to the emergency room".  She states maybe " I have Covid" . Patient is advised she needs an evaluation in person at emergency room, she declined and asks if she can go to an urgent care. Not recommended but advised it is her choice and she does need to be seen immediately on 12/15/18 at 2:04pm time of call to the clinic.  Patient verbalized understanding of all instructions given and denies any further questions at this time.

## 2019-01-11 ENCOUNTER — Ambulatory Visit: Payer: Managed Care, Other (non HMO) | Admitting: Gastroenterology

## 2019-03-15 ENCOUNTER — Ambulatory Visit: Payer: Managed Care, Other (non HMO) | Admitting: Certified Nurse Midwife

## 2019-03-15 ENCOUNTER — Encounter: Payer: Self-pay | Admitting: Certified Nurse Midwife

## 2019-03-15 ENCOUNTER — Other Ambulatory Visit: Payer: Self-pay

## 2019-03-15 VITALS — BP 105/76 | HR 68 | Ht 69.0 in | Wt 177.0 lb

## 2019-03-15 DIAGNOSIS — Z3201 Encounter for pregnancy test, result positive: Secondary | ICD-10-CM

## 2019-03-15 DIAGNOSIS — N912 Amenorrhea, unspecified: Secondary | ICD-10-CM | POA: Diagnosis not present

## 2019-03-15 LAB — POCT URINE PREGNANCY: Preg Test, Ur: POSITIVE — AB

## 2019-03-15 MED ORDER — BONJESTA 20-20 MG PO TBCR: 1.0000 | EXTENDED_RELEASE_TABLET | Freq: Two times a day (BID) | ORAL | 4 refills | Status: DC

## 2019-03-15 NOTE — Progress Notes (Signed)
Subjective:    Danielle Wood is a 28 y.o. female who presents for evaluation of amenorrhea. She believes she could be pregnant. Pregnancy is desired. Sexual Activity: single partner, contraception: none. Current symptoms also include: nausea. Last period was normal.   Patient's last menstrual period was 02/04/2019 (exact date). The following portions of the patient's history were reviewed and updated as appropriate: allergies, current medications, past family history, past medical history, past social history, past surgical history and problem list.  Review of Systems Pertinent items are noted in HPI.     Objective:    BP 105/76   Pulse 68   Ht 5\' 9"  (1.753 m)   Wt 177 lb (80.3 kg)   LMP 02/04/2019 (Exact Date)   BMI 26.14 kg/m  General: alert, cooperative, appears stated age, no distress and no acute distress    Lab Review Urine HCG: positive    Assessment:    Absence of menstruation.     Plan:   Positive: EDC: . Briefly discussed pre-natal care options.MD or Midwifery care reviewed. Plans midwifery care. Encouraged well-balanced diet, plenty of rest when needed, pre-natal vitamins daily and walking for exercise. Discussed self-help for nausea, sample Bonjesta given, avoiding OTC medications until consulting provider or pharmacist, other than Tylenol as needed, minimal caffeine (1-2 cups daily) and avoiding alcohol. Discussed xanax use in pregnancy for panic attacks, reviewed risk and cautioned pt to take sparingly.  She will schedule her dating u/s 1-2 wks, nurse visit 5 wks and her new  OB visit 7 wks. Feel free to call with any questions.   Philip Aspen, CNM

## 2019-03-15 NOTE — Patient Instructions (Signed)

## 2019-03-22 ENCOUNTER — Telehealth: Payer: Self-pay

## 2019-03-22 MED ORDER — ONDANSETRON 4 MG PO TBDP: 4.0000 mg | ORAL_TABLET | Freq: Four times a day (QID) | ORAL | 1 refills | Status: DC | PRN

## 2019-03-22 NOTE — Telephone Encounter (Signed)
Zofran sent to pharmacy on file. Mychart message sent to patient.

## 2019-03-23 ENCOUNTER — Other Ambulatory Visit: Payer: Self-pay

## 2019-03-23 ENCOUNTER — Ambulatory Visit (INDEPENDENT_AMBULATORY_CARE_PROVIDER_SITE_OTHER): Payer: Managed Care, Other (non HMO)

## 2019-03-23 DIAGNOSIS — Z3A01 Less than 8 weeks gestation of pregnancy: Secondary | ICD-10-CM

## 2019-03-23 DIAGNOSIS — N8311 Corpus luteum cyst of right ovary: Secondary | ICD-10-CM | POA: Diagnosis not present

## 2019-03-23 DIAGNOSIS — N912 Amenorrhea, unspecified: Secondary | ICD-10-CM

## 2019-03-23 DIAGNOSIS — O3481 Maternal care for other abnormalities of pelvic organs, first trimester: Secondary | ICD-10-CM | POA: Diagnosis not present

## 2019-04-17 ENCOUNTER — Other Ambulatory Visit: Payer: Self-pay

## 2019-04-17 ENCOUNTER — Ambulatory Visit (INDEPENDENT_AMBULATORY_CARE_PROVIDER_SITE_OTHER): Payer: Managed Care, Other (non HMO) | Admitting: Certified Nurse Midwife

## 2019-04-17 VITALS — BP 108/73 | HR 88 | Ht 69.0 in | Wt 172.4 lb

## 2019-04-17 DIAGNOSIS — Z113 Encounter for screening for infections with a predominantly sexual mode of transmission: Secondary | ICD-10-CM

## 2019-04-17 DIAGNOSIS — Z0283 Encounter for blood-alcohol and blood-drug test: Secondary | ICD-10-CM

## 2019-04-17 DIAGNOSIS — Z3491 Encounter for supervision of normal pregnancy, unspecified, first trimester: Secondary | ICD-10-CM

## 2019-04-17 LAB — OB RESULTS CONSOLE GC/CHLAMYDIA: Gonorrhea: NEGATIVE

## 2019-04-17 LAB — OB RESULTS CONSOLE VARICELLA ZOSTER ANTIBODY, IGG: Varicella: IMMUNE

## 2019-04-17 NOTE — Progress Notes (Signed)
Danielle Wood presents for NOB nurse interview visit.  Pregnancy confirmation done at [facility].  G-2.  P-1.  LMP 02/04/2019.  EDD 11/11/2019. Dating scan done 03/23/2019 [facility] [redacted]w[redacted]d.  Pregnancy education material explained and given.  No cats in the home.  NOB labs ordered.  TSH/HgA1C not ordered.  Body mass index is 25.46 kg/m.  Sickle cell not ordered. HIV and drug screen were explained and ordered.  PNV encouraged.  Genetic screening options discussed.  Genetic testing: Patient request.  FMLA form signed and financial policy reviewed.  NOB physical scheduled with [facility] already.    Patient requesting first trimester genetic testing be done with MFM.

## 2019-04-17 NOTE — Patient Instructions (Signed)
First Trimester of Pregnancy °The first trimester of pregnancy is from week 1 until the end of week 13 (months 1 through 3). A week after a sperm fertilizes an egg, the egg will implant on the wall of the uterus. This embryo will begin to develop into a baby. Genes from you and your partner will form the baby. The female genes will determine whether the baby will be a boy or a girl. At 6-8 weeks, the eyes and face will be formed, and the heartbeat can be seen on ultrasound. At the end of 12 weeks, all the baby's organs will be formed. °Now that you are pregnant, you will want to do everything you can to have a healthy baby. Two of the most important things are to get good prenatal care and to follow your health care provider's instructions. Prenatal care is all the medical care you receive before the baby's birth. This care will help prevent, find, and treat any problems during the pregnancy and childbirth. °Body changes during your first trimester °Your body goes through many changes during pregnancy. The changes vary from woman to woman. °· You may gain or lose a couple of pounds at first. °· You may feel sick to your stomach (nauseous) and you may throw up (vomit). If the vomiting is uncontrollable, call your health care provider. °· You may tire easily. °· You may develop headaches that can be relieved by medicines. All medicines should be approved by your health care provider. °· You may urinate more often. Painful urination may mean you have a bladder infection. °· You may develop heartburn as a result of your pregnancy. °· You may develop constipation because certain hormones are causing the muscles that push stool through your intestines to slow down. °· You may develop hemorrhoids or swollen veins (varicose veins). °· Your breasts may begin to grow larger and become tender. Your nipples may stick out more, and the tissue that surrounds them (areola) may become darker. °· Your gums may bleed and may be  sensitive to brushing and flossing. °· Dark spots or blotches (chloasma, mask of pregnancy) may develop on your face. This will likely fade after the baby is born. °· Your menstrual periods will stop. °· You may have a loss of appetite. °· You may develop cravings for certain kinds of food. °· You may have changes in your emotions from day to day, such as being excited to be pregnant or being concerned that something may go wrong with the pregnancy and baby. °· You may have more vivid and strange dreams. °· You may have changes in your hair. These can include thickening of your hair, rapid growth, and changes in texture. Some women also have hair loss during or after pregnancy, or hair that feels dry or thin. Your hair will most likely return to normal after your baby is born. °What to expect at prenatal visits °During a routine prenatal visit: °· You will be weighed to make sure you and the baby are growing normally. °· Your blood pressure will be taken. °· Your abdomen will be measured to track your baby's growth. °· The fetal heartbeat will be listened to between weeks 10 and 14 of your pregnancy. °· Test results from any previous visits will be discussed. °Your health care provider may ask you: °· How you are feeling. °· If you are feeling the baby move. °· If you have had any abnormal symptoms, such as leaking fluid, bleeding, severe headaches, or abdominal   cramping. °· If you are using any tobacco products, including cigarettes, chewing tobacco, and electronic cigarettes. °· If you have any questions. °Other tests that may be performed during your first trimester include: °· Blood tests to find your blood type and to check for the presence of any previous infections. The tests will also be used to check for low iron levels (anemia) and protein on red blood cells (Rh antibodies). Depending on your risk factors, or if you previously had diabetes during pregnancy, you may have tests to check for high blood sugar  that affects pregnant women (gestational diabetes). °· Urine tests to check for infections, diabetes, or protein in the urine. °· An ultrasound to confirm the proper growth and development of the baby. °· Fetal screens for spinal cord problems (spina bifida) and Down syndrome. °· HIV (human immunodeficiency virus) testing. Routine prenatal testing includes screening for HIV, unless you choose not to have this test. °· You may need other tests to make sure you and the baby are doing well. °Follow these instructions at home: °Medicines °· Follow your health care provider's instructions regarding medicine use. Specific medicines may be either safe or unsafe to take during pregnancy. °· Take a prenatal vitamin that contains at least 600 micrograms (mcg) of folic acid. °· If you develop constipation, try taking a stool softener if your health care provider approves. °Eating and drinking ° °· Eat a balanced diet that includes fresh fruits and vegetables, whole grains, good sources of protein such as meat, eggs, or tofu, and low-fat dairy. Your health care provider will help you determine the amount of weight gain that is right for you. °· Avoid raw meat and uncooked cheese. These carry germs that can cause birth defects in the baby. °· Eating four or five small meals rather than three large meals a day may help relieve nausea and vomiting. If you start to feel nauseous, eating a few soda crackers can be helpful. Drinking liquids between meals, instead of during meals, also seems to help ease nausea and vomiting. °· Limit foods that are high in fat and processed sugars, such as fried and sweet foods. °· To prevent constipation: °? Eat foods that are high in fiber, such as fresh fruits and vegetables, whole grains, and beans. °? Drink enough fluid to keep your urine clear or pale yellow. °Activity °· Exercise only as directed by your health care provider. Most women can continue their usual exercise routine during  pregnancy. Try to exercise for 30 minutes at least 5 days a week. Exercising will help you: °? Control your weight. °? Stay in shape. °? Be prepared for labor and delivery. °· Experiencing pain or cramping in the lower abdomen or lower back is a good sign that you should stop exercising. Check with your health care provider before continuing with normal exercises. °· Try to avoid standing for long periods of time. Move your legs often if you must stand in one place for a long time. °· Avoid heavy lifting. °· Wear low-heeled shoes and practice good posture. °· You may continue to have sex unless your health care provider tells you not to. °Relieving pain and discomfort °· Wear a good support bra to relieve breast tenderness. °· Take warm sitz baths to soothe any pain or discomfort caused by hemorrhoids. Use hemorrhoid cream if your health care provider approves. °· Rest with your legs elevated if you have leg cramps or low back pain. °· If you develop varicose veins in   your legs, wear support hose. Elevate your feet for 15 minutes, 3-4 times a day. Limit salt in your diet. °Prenatal care °· Schedule your prenatal visits by the twelfth week of pregnancy. They are usually scheduled monthly at first, then more often in the last 2 months before delivery. °· Write down your questions. Take them to your prenatal visits. °· Keep all your prenatal visits as told by your health care provider. This is important. °Safety °· Wear your seat belt at all times when driving. °· Make a list of emergency phone numbers, including numbers for family, friends, the hospital, and police and fire departments. °General instructions °· Ask your health care provider for a referral to a local prenatal education class. Begin classes no later than the beginning of month 6 of your pregnancy. °· Ask for help if you have counseling or nutritional needs during pregnancy. Your health care provider can offer advice or refer you to specialists for help  with various needs. °· Do not use hot tubs, steam rooms, or saunas. °· Do not douche or use tampons or scented sanitary pads. °· Do not cross your legs for long periods of time. °· Avoid cat litter boxes and soil used by cats. These carry germs that can cause birth defects in the baby and possibly loss of the fetus by miscarriage or stillbirth. °· Avoid all smoking, herbs, alcohol, and medicines not prescribed by your health care provider. Chemicals in these products affect the formation and growth of the baby. °· Do not use any products that contain nicotine or tobacco, such as cigarettes and e-cigarettes. If you need help quitting, ask your health care provider. You may receive counseling support and other resources to help you quit. °· Schedule a dentist appointment. At home, brush your teeth with a soft toothbrush and be gentle when you floss. °Contact a health care provider if: °· You have dizziness. °· You have mild pelvic cramps, pelvic pressure, or nagging pain in the abdominal area. °· You have persistent nausea, vomiting, or diarrhea. °· You have a bad smelling vaginal discharge. °· You have pain when you urinate. °· You notice increased swelling in your face, hands, legs, or ankles. °· You are exposed to fifth disease or chickenpox. °· You are exposed to German measles (rubella) and have never had it. °Get help right away if: °· You have a fever. °· You are leaking fluid from your vagina. °· You have spotting or bleeding from your vagina. °· You have severe abdominal cramping or pain. °· You have rapid weight gain or loss. °· You vomit blood or material that looks like coffee grounds. °· You develop a severe headache. °· You have shortness of breath. °· You have any kind of trauma, such as from a fall or a car accident. °Summary °· The first trimester of pregnancy is from week 1 until the end of week 13 (months 1 through 3). °· Your body goes through many changes during pregnancy. The changes vary from  woman to woman. °· You will have routine prenatal visits. During those visits, your health care provider will examine you, discuss any test results you may have, and talk with you about how you are feeling. °This information is not intended to replace advice given to you by your health care provider. Make sure you discuss any questions you have with your health care provider. °Document Released: 04/28/2001 Document Revised: 04/16/2017 Document Reviewed: 04/15/2016 °Elsevier Patient Education © 2020 [employer] ° °Commonly Asked   Questions During Pregnancy ° °Cats: A parasite can be excreted in cat feces.  To avoid exposure you need to have another person empty the little box.  If you must empty the litter box you will need to wear gloves.  Wash your hands after handling your cat.  This parasite can also be found in raw or undercooked meat so this should also be avoided. ° °Colds, Sore Throats, Flu: Please check your medication sheet to see what you can take for symptoms.  If your symptoms are unrelieved by these medications please call the office. ° °Dental Work: Most any dental work your dentist recommends is permitted.  X-rays should only be taken during the first trimester if absolutely necessary.  Your abdomen should be shielded with a lead apron during all x-rays.  Please notify your provider prior to receiving any x-rays.  Novocaine is fine; gas is not recommended.  If your dentist requires a note from us prior to dental work please call the office and we will provide one for you. ° °Exercise: Exercise is an important part of staying healthy during your pregnancy.  You may continue most exercises you were accustomed to prior to pregnancy.  Later in your pregnancy you will most likely notice you have difficulty with activities requiring balance like riding a bicycle.  It is important that you listen to your body and avoid activities that put you at a higher risk of falling.  Adequate rest and staying well  hydrated are a must!  If you have questions about the safety of specific activities ask your provider.   ° °Exposure to Children with illness: Try to avoid obvious exposure; report any symptoms to us when noted,  If you have chicken pos, red measles or mumps, you should be immune to these diseases.   Please do not take any vaccines while pregnant unless you have checked with your OB provider. ° °Fetal Movement: After 28 weeks we recommend you do "kick counts" twice daily.  Lie or sit down in a calm quiet environment and count your baby movements "kicks".  You should feel your baby at least 10 times per hour.  If you have not felt 10 kicks within the first hour get up, walk around and have something sweet to eat or drink then repeat for an additional hour.  If count remains less than 10 per hour notify your provider. ° °Fumigating: Follow your pest control agent's advice as to how long to stay out of your home.  Ventilate the area well before re-entering. ° °Hemorrhoids:   Most over-the-counter preparations can be used during pregnancy.  Check your medication to see what is safe to use.  It is important to use a stool softener or fiber in your diet and to drink lots of liquids.  If hemorrhoids seem to be getting worse please call the office.  ° °Hot Tubs:  Hot tubs Jacuzzis and saunas are not recommended while pregnant.  These increase your internal body temperature and should be avoided. ° °Intercourse:  Sexual intercourse is safe during pregnancy as long as you are comfortable, unless otherwise advised by your provider.  Spotting may occur after intercourse; report any bright red bleeding that is heavier than spotting. ° °Labor:  If you know that you are in labor, please go to the hospital.  If you are unsure, please call the office and let us help you decide what to do. ° °Lifting, straining, etc:  If your job requires heavy lifting or   straining please check with your provider for any limitations.  Generally, you  should not lift items heavier than that you can lift simply with your hands and arms (no back muscles) ° °Painting:  Paint fumes do not harm your pregnancy, but may make you ill and should be avoided if possible.  Latex or water based paints have less odor than oils.  Use adequate ventilation while painting. ° °Permanents & Hair Color:  Chemicals in hair dyes are not recommended as they cause increase hair dryness which can increase hair loss during pregnancy.  " Highlighting" and permanents are allowed.  Dye may be absorbed differently and permanents may not hold as well during pregnancy. ° °Sunbathing:  Use a sunscreen, as skin burns easily during pregnancy.  Drink plenty of fluids; avoid over heating. ° °Tanning Beds:  Because their possible side effects are still unknown, tanning beds are not recommended. ° °Ultrasound Scans:  Routine ultrasounds are performed at approximately 20 weeks.  You will be able to see your baby's general anatomy an if you would like to know the gender this can usually be determined as well.  If it is questionable when you conceived you may also receive an ultrasound early in your pregnancy for dating purposes.  Otherwise ultrasound exams are not routinely performed unless there is a medical necessity.  Although you can request a scan we ask that you pay for it when conducted because insurance does not cover " patient request" scans. ° °Work: If your pregnancy proceeds without complications you may work until your due date, unless your physician or employer advises otherwise. ° °Round Ligament Pain/Pelvic Discomfort:  Sharp, shooting pains not associated with bleeding are fairly common, usually occurring in the second trimester of pregnancy.  They tend to be worse when standing up or when you remain standing for long periods of time.  These are the result of pressure of certain pelvic ligaments called "round ligaments".  Rest, Tylenol and heat seem to be the most effective relief.  As  the womb and fetus grow, they rise out of the pelvis and the discomfort improves.  Please notify the office if your pain seems different than that described.  It may represent a more serious condition. ° ° °How a Baby Grows During Pregnancy ° °Pregnancy begins when a female's sperm enters a female's egg (fertilization). Fertilization usually happens in one of the tubes (fallopian tubes) that connect the ovaries to the womb (uterus). The fertilized egg moves down the fallopian tube to the uterus. Once it reaches the uterus, it implants into the lining of the uterus and begins to grow. °For the first 10 weeks, the fertilized egg is called an embryo. After 10 weeks, it is called a fetus. As the fetus continues to grow, it receives oxygen and nutrients through tissue (placenta) that grows to support the developing baby. The placenta is the life support system for the baby. It provides oxygen and nutrition and removes waste. °Learning as much as you can about your pregnancy and how your baby is developing can help you enjoy the experience. It can also make you aware of when there might be a problem and when to ask questions. °How long does a typical pregnancy last? °A pregnancy usually lasts 280 days, or about 40 weeks. Pregnancy is divided into three periods of growth, also called trimesters: °· First trimester: 0-12 weeks. °· Second trimester: 13-27 weeks. °· Third trimester: 28-40 weeks. °The day when your baby is ready to be   born (full term) is your estimated date of delivery. °How does my baby develop month by month? °First month °· The fertilized egg attaches to the inside of the uterus. °· Some cells will form the placenta. Others will form the fetus. °· The arms, legs, brain, spinal cord, lungs, and heart begin to develop. °· At the end of the first month, the heart begins to beat. °Second month °· The bones, inner ear, eyelids, hands, and feet form. °· The genitals develop. °· By the end of 8 weeks, all major  organs are developing. °Third month °· All of the internal organs are forming. °· Teeth develop below the gums. °· Bones and muscles begin to grow. The spine can flex. °· The skin is transparent. °· Fingernails and toenails begin to form. °· Arms and legs continue to grow longer, and hands and feet develop. °· The fetus is about 3 inches (7.6 cm) long. °Fourth month °· The placenta is completely formed. °· The external sex organs, neck, outer ear, eyebrows, eyelids, and fingernails are formed. °· The fetus can hear, swallow, and move its arms and legs. °· The kidneys begin to produce urine. °· The skin is covered with a white, waxy coating (vernix) and very fine hair (lanugo). °Fifth month °· The fetus moves around more and can be felt for the first time (quickening). °· The fetus starts to sleep and wake up and may begin to suck its finger. °· The nails grow to the end of the fingers. °· The organ in the digestive system that makes bile (gallbladder) functions and helps to digest nutrients. °· If your baby is a girl, eggs are present in her ovaries. If your baby is a boy, testicles start to move down into his scrotum. °Sixth month °· The lungs are formed. °· The eyes open. The brain continues to develop. °· Your baby has fingerprints and toe prints. Your baby's hair grows thicker. °· At the end of the second trimester, the fetus is about 9 inches (22.9 cm) long. °Seventh month °· The fetus kicks and stretches. °· The eyes are developed enough to sense changes in light. °· The hands can make a grasping motion. °· The fetus responds to sound. °Eighth month °· All organs and body systems are fully developed and functioning. °· Bones harden, and taste buds develop. The fetus may hiccup. °· Certain areas of the brain are still developing. The skull remains soft. °Ninth month °· The fetus gains about ½ lb (0.23 kg) each week. °· The lungs are fully developed. °· Patterns of sleep develop. °· The fetus's head typically  moves into a head-down position (vertex) in the uterus to prepare for birth. °· The fetus weighs 6-9 lb (2.72-4.08 kg) and is 19-20 inches (48.26-50.8 cm) long. °What can I do to have a healthy pregnancy and help my baby develop? °General instructions °· Take prenatal vitamins as directed by your health care provider. These include vitamins such as folic acid, iron, calcium, and vitamin D. They are important for healthy development. °· Take medicines only as directed by your health care provider. Read labels and ask a pharmacist or your health care provider whether over-the-counter medicines, supplements, and prescription drugs are safe to take during pregnancy. °· Keep all follow-up visits as directed by your health care provider. This is important. Follow-up visits include prenatal care and screening tests. °How do I know if my baby is developing well? °At each prenatal visit, your health care provider will do   several different tests to check on your health and keep track of your baby's development. These include: °· Fundal height and position. °? Your health care provider will measure your growing belly from your pubic bone to the top of the uterus using a tape measure. °? Your health care provider will also feel your belly to determine your baby's position. °· Heartbeat. °? An ultrasound in the first trimester can confirm pregnancy and show a heartbeat, depending on how far along you are. °? Your health care provider will check your baby's heart rate at every prenatal visit. °· Second trimester ultrasound. °? This ultrasound checks your baby's development. It also may show your baby's gender. °What should I do if I have concerns about my baby's development? °Always talk with your health care provider about any concerns that you may have about your pregnancy and your baby. °Summary °· A pregnancy usually lasts 280 days, or about 40 weeks. Pregnancy is divided into three periods of growth, also called  trimesters. °· Your health care provider will monitor your baby's growth and development throughout your pregnancy. °· Follow your health care provider's recommendations about taking prenatal vitamins and medicines during your pregnancy. °· Talk with your health care provider if you have any concerns about your pregnancy or your developing baby. °This information is not intended to replace advice given to you by your health care provider. Make sure you discuss any questions you have with your health care provider. °Document Released: 10/21/2007 Document Revised: 08/25/2018 Document Reviewed: 03/17/2017 °Elsevier Patient Education © 2020 [employer] °  °

## 2019-04-18 ENCOUNTER — Other Ambulatory Visit: Payer: Self-pay | Admitting: Certified Nurse Midwife

## 2019-04-18 DIAGNOSIS — Z369 Encounter for antenatal screening, unspecified: Secondary | ICD-10-CM

## 2019-04-18 LAB — URINALYSIS, ROUTINE W REFLEX MICROSCOPIC
Bilirubin, UA: NEGATIVE
Glucose, UA: NEGATIVE
Nitrite, UA: NEGATIVE
Protein,UA: NEGATIVE
RBC, UA: NEGATIVE
Specific Gravity, UA: >=1.03 — AB (ref 1.005–1.030)
Urobilinogen, Ur: 0.2 mg/dL (ref 0.2–1.0)
pH, UA: 5 (ref 5.0–7.5)

## 2019-04-18 LAB — RUBELLA SCREEN: Rubella Antibodies, IGG: 7.23 index (ref >0.99)

## 2019-04-18 LAB — MONITOR DRUG PROFILE 14(MW)
Amphetamine Scrn, Ur: NEGATIVE ng/mL
BARBITURATE SCREEN URINE: NEGATIVE ng/mL
BENZODIAZEPINE SCREEN, URINE: NEGATIVE ng/mL
Buprenorphine, Urine: NEGATIVE ng/mL
CANNABINOIDS UR QL SCN: NEGATIVE ng/mL
Cocaine (Metab) Scrn, Ur: NEGATIVE ng/mL
Creatinine(Crt), U: 175 mg/dL (ref 20.0–300.0)
Fentanyl, Urine: NEGATIVE pg/mL
Meperidine Screen, Urine: NEGATIVE ng/mL
Methadone Screen, Urine: NEGATIVE ng/mL
OXYCODONE+OXYMORPHONE UR QL SCN: NEGATIVE ng/mL
Opiate Scrn, Ur: NEGATIVE ng/mL
Ph of Urine: 5.2 (ref 4.5–8.9)
Phencyclidine Qn, Ur: NEGATIVE ng/mL
Propoxyphene Scrn, Ur: NEGATIVE ng/mL
SPECIFIC GRAVITY: 1.031
Tramadol Screen, Urine: NEGATIVE ng/mL

## 2019-04-18 LAB — MICROSCOPIC EXAMINATION
Casts: NONE SEEN /lpf
Epithelial Cells (non renal): >10 /hpf — AB (ref 0–10)

## 2019-04-18 LAB — ABO AND RH: Rh Factor: NEGATIVE

## 2019-04-18 LAB — HEPATITIS B SURFACE ANTIGEN: Hepatitis B Surface Ag: NEGATIVE

## 2019-04-18 LAB — RPR: RPR Ser Ql: NONREACTIVE

## 2019-04-18 LAB — VARICELLA ZOSTER ANTIBODY, IGG: Varicella zoster IgG: 208 index (ref >165)

## 2019-04-18 LAB — GC/CHLAMYDIA PROBE AMP
Chlamydia trachomatis, NAA: NEGATIVE
Neisseria Gonorrhoeae by PCR: NEGATIVE

## 2019-04-18 LAB — NICOTINE SCREEN, URINE: Cotinine Ql Scrn, Ur: NEGATIVE ng/mL

## 2019-04-18 LAB — ANTIBODY SCREEN: Antibody Screen: NEGATIVE

## 2019-04-18 LAB — HGB SOLU + RFLX FRAC: Sickle Solubility Test - HGBRFX: NEGATIVE

## 2019-04-18 LAB — HIV ANTIBODY (ROUTINE TESTING W REFLEX): HIV Screen 4th Generation wRfx: NONREACTIVE

## 2019-04-19 ENCOUNTER — Telehealth: Payer: Self-pay | Admitting: Certified Nurse Midwife

## 2019-04-19 ENCOUNTER — Other Ambulatory Visit: Payer: Self-pay | Admitting: Certified Nurse Midwife

## 2019-04-19 LAB — URINE CULTURE, OB REFLEX

## 2019-04-19 LAB — CULTURE, OB URINE

## 2019-04-19 MED ORDER — NITROFURANTOIN MONOHYD MACRO 100 MG PO CAPS: 100.0000 mg | ORAL_CAPSULE | Freq: Two times a day (BID) | ORAL | 0 refills | Status: AC

## 2019-04-19 NOTE — Progress Notes (Signed)
Pt has uti, orders placed for treatment.   Philip Aspen, CNM

## 2019-04-19 NOTE — Telephone Encounter (Signed)
Called pt to info on her appt at [facility]. PT said she will need to reschedule I gave her barbara's number.

## 2019-04-20 ENCOUNTER — Ambulatory Visit
Admission: RE | Admit: 2019-04-20 | Discharge: 2019-04-20 | Disposition: A | Discharge status: Home / Self Care | Payer: Managed Care, Other (non HMO) | Source: Ambulatory Visit | Attending: Obstetrics and Gynecology | Admitting: Obstetrics and Gynecology

## 2019-04-20 ENCOUNTER — Other Ambulatory Visit: Payer: Self-pay

## 2019-04-20 DIAGNOSIS — Z8279 Family history of other congenital malformations, deformations and chromosomal abnormalities: Secondary | ICD-10-CM

## 2019-04-20 NOTE — Progress Notes (Signed)
Virtual Visit via Telephone Note  I connected with Danielle Wood on April 20, 2019 at  3:00 PM EST by telephone and verified that I am speaking with the correct person using two identifiers.  Referring physician:  [facility] Length of Consultation:  30 minutes  [name] was referred to [place] for genetic counseling to review prenatal screening and testing options due to the family history of a chromosome condition.  This note summarizes the information we discussed.    We first reviewed the family history obtained in Danielle Wood's last pregnancy.  She stated at that time that her mother had a son who passed away at birth due to trisomy 82.  There were no other family members with birth defects, learning differences, recurrent pregnancy loss or known genetic conditions.  To review, Trisomy 13 is caused by having three copies (instead of the usual two copies) of the genes on chromosome number 13.  There are two ways that trisomy 13 occur.  Most often (>95% of the time), Trisomy 13 is caused by an entire third copy of chromosome 13.  In the other cases, the condition is caused by a rearrangement of the chromosomes, known as a translocation.  We offered to review medical records on her brother to confirm the chromosomal information.  [name] mother indicated that she recalled it being the type that happened by chance and that she had amniocentesis in her pregnancy with our patient, which was normal.  We would expect if this was reported as normal, then [name] does not carry a translocation that would put this pregnancy at increased risk for a chromosome condition, but we are happy to review medical records if desired.  If her brother had the trisomy type, then we do not expect this pregnancy to be at increased risk. [name] daughter from her first pregnancy is now 29 years old an in good health.  In the current pregnancy, she reported no complications  or exposures that would be expected to increase the risk for birth defects.  The current pregnancy is with a different partner, Danielle Wood.  Danielle Wood is 28 years old and in good health.  He has a diagnosis of ankylosing spondylitis, which is thought to be likely due to a combination of genetic and environmental factors known as multifactorial inheritance.  While several genetic variations have been associated with this condition, no specific causative genes are known. Danielle Wood also reported that his father passed away from [place], which may also have some inherited components, but is a condition in which the specific cause is often not well understood.  There were no other family members with known genetic conditions, birth defects or intellectual delays.  We offered the following routine screening tests for this pregnancy:  The most accurate screening option for chromosome conditions is cell free fetal DNA testing.  Though this is typically reserved for pregnancies at increased risk for aneuploidy, it is currently being made available and many insurance companies are adding coverage for this testing in low risk patients during [place].  This test utilizes a maternal blood sample and DNA sequencing technology to isolate circulating cell free fetal DNA from maternal plasma.  The fetal DNA can then be analyzed for DNA sequences that are derived from the three most common chromosomes involved in aneuploidy, chromosomes 13, 18, and 21.  If the overall amount of DNA is greater than the expected level for any of these chromosomes, aneuploidy is suspected.  The detection rates are >99% for Down syndrome, >98% for trisomy 18 and >91% for Trisomy 13.  While we do not consider it a replacement for invasive testing and karyotype analysis, a negative result from this testing would be reassuring, though not a guarantee of a normal chromosome complement for the baby.  An abnormal result may be suggestive of an abnormal chromosome  complement, though we would still recommend CVS or amniocentesis to confirm any findings from this testing.  First trimester screening, which includes nuchal translucency ultrasound screen and first trimester maternal serum marker screening, is the test that has most recently been available for low risk patients.  The nuchal translucency has approximately an 80% detection rate for Down syndrome and can be positive for other chromosome abnormalities as well as congenital heart defects.  When combined with a maternal serum marker screening, the detection rate is up to 90% for Down syndrome and up to 97% for trisomy 18.   Given current recommendations during COVID, we are offering only the biochemical testing portion of this testing (without the ultrasound and NT portion), which has a much lower detection rate.  Maternal serum marker screening, or "quad" screen, is a blood test that measures pregnancy proteins, can provide risk assessments for Down syndrome, trisomy 18, and open neural tube defects (spina bifida, anencephaly). Because it does not directly examine the fetus, it cannot positively diagnose or rule out these problems. This is a second trimester option which could be offered along with the anatomy ultrasound. It can detect approximately 75% of babies with Down syndrome, 80% of babies with open spina bifida and 70% of babies with trisomy 218.  Targeted ultrasound uses high frequency sound waves to create an image of the developing fetus.  An ultrasound is often recommended as a routine means of evaluating the pregnancy.  It is also used to screen for fetal anatomy problems (for example, a heart defect) that might be suggestive of a chromosomal or other abnormality. We are currently not recommending a first trimester ultrasound other than that which would be ordered for dating and viability.  Should these screening tests indicate an increased concern, then the following additional testing options would  be offered:  The chorionic villus sampling procedure is available for first trimester chromosome analysis.  This involves the withdrawal of a small amount of chorionic villi (tissue from the developing placenta).  Risk of pregnancy loss is estimated to be approximately 1 in 200 to 1 in 100 (0.5 to 1%).  There is approximately a 1% (1 in 100) chance that the CVS chromosome results will be unclear.  Chorionic villi cannot be tested for neural tube defects.     Amniocentesis involves the removal of a small amount of amniotic fluid from the sac surrounding the fetus with the use of a thin needle inserted through the maternal abdomen and uterus.  Ultrasound guidance is used throughout the procedure.  Fetal cells from amniotic fluid are directly evaluated and > 99.5% of chromosome problems and > 98% of open neural tube defects can be detected. This procedure is generally performed after the 15th week of pregnancy.  The main risks to this procedure include complications leading to miscarriage in less than 1 in 200 cases (0.5%).  Cystic Fibrosis and Spinal Muscular Atrophy (SMA) screening were also discussed with the patient. Both conditions are recessive, which means that both parents must be carriers in order to have a child with the disease.  Cystic fibrosis (CF) is one of  the most common genetic conditions in persons of Caucasian ancestry.  This condition occurs in approximately 1 in 2,500 Caucasian persons and results in thickened secretions in the lungs, digestive, and reproductive systems.  For a baby to be at risk for having CF, both of the parents must be carriers for this condition.  Approximately 1 in 63 Caucasian persons is a carrier for CF.  Current carrier testing looks for the most common mutations in the gene for CF and can detect approximately 90% of carriers in the Caucasian population.  This means that the carrier screening can greatly reduce, but cannot eliminate, the chance for an individual to  have a child with CF.  If an individual is found to be a carrier for CF, then carrier testing would be available for the partner. As part of [place] 's newborn screening profile, all babies born in the state of [place] will have a two-tier screening process.  Specimens are first tested to determine the concentration of immunoreactive trypsinogen (IRT).  The top 5% of specimens with the highest IRT values then undergo DNA testing using a panel of over 40 common CF mutations. SMA is a neurodegenerative disorder that leads to atrophy of skeletal muscle and overall weakness.  This condition is also more prevalent in the Caucasian population, with 1 in 40-1 in 60 persons being a carrier and 1 in 6,000-1 in 10,000 children being affected.  There are multiple forms of the disease, with some causing death in infancy to other forms with survival into adulthood.  The genetics of SMA is complex, but carrier screening can detect up to 95% of carriers in the Caucasian population.  Similar to CF, a negative result can greatly reduce, but cannot eliminate, the chance to have a child with SMA. The patient declined carrier screening for CF and SMA.  We talked about the option of signing up for Early Check to have the baby tested for SMA after delivery as part of a new study in .  This registration can be done online prior to delivery if desired.  After consideration of the options, Ms. Dayton Scrape elected to have MaterniT21 PLUS with SCA drawn at a [facility] at [redacted] weeks gestation.  No ultrasound will be performed at [facility] at this time, pending these results, per COVID guidelines. This will minimize the number of visits that she needs to attend in person.  The patient declined carrier testing for CF and SMA.  The patient was encouraged to call with questions or concerns.  We can be contacted at [phone].  Labs ordered: MaterniT21 PLUS with SCA at [facility] to be drawn the week of  04/24/19.  Cherly Anderson, MS, CGC  I provided 30 minutes of non-face-to-face time during this encounter.   Katrina Stack

## 2019-04-24 ENCOUNTER — Other Ambulatory Visit: Payer: Managed Care, Other (non HMO)

## 2019-05-01 ENCOUNTER — Telehealth: Payer: Self-pay | Admitting: Obstetrics and Gynecology

## 2019-05-01 ENCOUNTER — Ambulatory Visit: Payer: Managed Care, Other (non HMO)

## 2019-05-01 ENCOUNTER — Ambulatory Visit
Admission: RE | Admit: 2019-05-01 | Discharge: 2019-05-01 | Disposition: A | Discharge status: Home / Self Care | Payer: Managed Care, Other (non HMO) | Source: Ambulatory Visit

## 2019-05-01 NOTE — Telephone Encounter (Signed)
The patient was informed of the results of her recent MaterniT21 testing which yielded NEGATIVE results.  The patient's specimen showed DNA consistent with two copies of chromosomes 21, 18 and 13.  The sensitivity for trisomy 21, trisomy 18 and trisomy 13 using this testing are reported as 99.1%, 99.9% and 91.7% respectively.  Thus, while the results of this testing are highly accurate, they are not considered diagnostic at this time.  Should more definitive information be desired, the patient may still consider amniocentesis.   As requested to know by the patient, sex chromosome analysis was included for this sample.  Results are consistent with a female fetus. This is predicted with >99% accuracy.  A maternal serum AFP only should be considered if screening for neural tube defects is desired.  We may be reached at [phone] with any questions or concerns.   Nazim Kadlec F. Verma Grothaus, MS, CGC   

## 2019-05-02 ENCOUNTER — Encounter: Payer: Managed Care, Other (non HMO) | Admitting: Certified Nurse Midwife

## 2019-05-08 ENCOUNTER — Encounter: Payer: Self-pay | Admitting: Certified Nurse Midwife

## 2019-05-08 ENCOUNTER — Other Ambulatory Visit (HOSPITAL_COMMUNITY)
Admission: RE | Admit: 2019-05-08 | Discharge: 2019-05-08 | Disposition: A | Discharge status: Home / Self Care | Payer: Managed Care, Other (non HMO) | Source: Ambulatory Visit | Attending: Certified Nurse Midwife | Admitting: Certified Nurse Midwife

## 2019-05-08 ENCOUNTER — Other Ambulatory Visit: Payer: Self-pay

## 2019-05-08 ENCOUNTER — Ambulatory Visit (INDEPENDENT_AMBULATORY_CARE_PROVIDER_SITE_OTHER): Payer: Managed Care, Other (non HMO) | Admitting: Certified Nurse Midwife

## 2019-05-08 VITALS — BP 110/70 | HR 80 | Wt 175.3 lb

## 2019-05-08 DIAGNOSIS — Z124 Encounter for screening for malignant neoplasm of cervix: Secondary | ICD-10-CM | POA: Insufficient documentation

## 2019-05-08 DIAGNOSIS — Z3A13 13 weeks gestation of pregnancy: Secondary | ICD-10-CM

## 2019-05-08 DIAGNOSIS — Z3481 Encounter for supervision of other normal pregnancy, first trimester: Secondary | ICD-10-CM

## 2019-05-08 DIAGNOSIS — Z3482 Encounter for supervision of other normal pregnancy, second trimester: Secondary | ICD-10-CM | POA: Diagnosis not present

## 2019-05-08 LAB — POCT URINALYSIS DIPSTICK OB
Bilirubin, UA: NEGATIVE
Blood, UA: NEGATIVE
Glucose, UA: NEGATIVE
Ketones, UA: NEGATIVE
Leukocytes, UA: NEGATIVE
Nitrite, UA: NEGATIVE
POC,PROTEIN,UA: NEGATIVE
Spec Grav, UA: 1.02 (ref 1.010–1.025)
Urobilinogen, UA: 0.2 E.U./dL
pH, UA: 6 (ref 5.0–8.0)

## 2019-05-08 MED ORDER — ONDANSETRON HCL 4 MG PO TABS: 4.0000 mg | ORAL_TABLET | Freq: Three times a day (TID) | ORAL | 1 refills | Status: DC | PRN

## 2019-05-08 NOTE — Progress Notes (Signed)
NEW OB HISTORY AND PHYSICAL  SUBJECTIVE:       Danielle Wood is a 28 y.o. G63P1001 female, Patient's last menstrual period was 02/04/2019 (exact date)., Estimated Date of Delivery: 11/11/19, [redacted]w[redacted]d, presents today for establishment of Prenatal Care.  She has no unusual complaints, breast tenderness and nausea.    Gynecologic History Patient's last menstrual period was 02/04/2019 (exact date). Normal Contraception: none Last Pap: 2017  Results were: normal  Obstetric History OB History  Gravida Para Term Preterm AB Living  2 1 1     1   SAB TAB Ectopic Multiple Live Births        0 1    # Outcome Date GA Lbr Len/2nd Weight Sex Delivery Anes PTL Lv  2 Current           1 Term 10/03/15 [redacted]w[redacted]d  6 lb 10.9 oz (3.03 kg) F Vag-Spont EPI N LIV    Past Medical History:  Diagnosis Date  . Anxiety     Past Surgical History:  Procedure Laterality Date  . TONSILLECTOMY AND ADENOIDECTOMY N/A 01/14/2016   Procedure: TONSILLECTOMY AND ADENOIDECTOMY;  Surgeon: 01/16/2016, MD;  Location: [facility] ORS;  Service: ENT;  Laterality: N/A;  . WISDOM TOOTH EXTRACTION  2010    Current Outpatient Medications on File Prior to Visit  Medication Sig Dispense Refill  . ALPRAZolam (XANAX) 0.5 MG tablet Take 0.5 mg by mouth 2 (two) times daily as needed for anxiety.    . Prenatal Vit-Fe Fumarate-FA (PRENATAL MULTIVITAMIN) TABS tablet Take 1 tablet by mouth daily at 12 noon.    . ondansetron (ZOFRAN ODT) 4 MG disintegrating tablet Take 1 tablet (4 mg total) by mouth every 6 (six) hours as needed for nausea. (Patient not taking: Reported on 05/08/2019) 20 tablet 1   No current facility-administered medications on file prior to visit.    Allergies  Allergen Reactions  . Sulfamethoxazole-Trimethoprim Other (See Comments)    Fever/chills/flu like symptoms    Social History   Socioeconomic History  . Marital status: Single    Spouse name: Not on file  . Number of children: Not on file  . Years of  education: Not on file  . Highest education level: Not on file  Occupational History  . Not on file  Tobacco Use  . Smoking status: Never Smoker  . Smokeless tobacco: Never Used  Substance and Sexual Activity  . Alcohol use: No  . Drug use: No  . Sexual activity: Yes    Birth control/protection: None  Other Topics Concern  . Not on file  Social History Narrative  . Not on file   Social Determinants of Health   Financial Resource Strain:   . Difficulty of Paying Living Expenses: Not on file  Food Insecurity:   . Worried About 05/10/2019 in the Last Year: Not on file  . Ran Out of Food in the Last Year: Not on file  Transportation Needs:   . Lack of Transportation (Medical): Not on file  . Lack of Transportation (Non-Medical): Not on file  Physical Activity:   . Days of Exercise per Week: Not on file  . Minutes of Exercise per Session: Not on file  Stress:   . Feeling of Stress : Not on file  Social Connections:   . Frequency of Communication with Friends and Family: Not on file  . Frequency of Social Gatherings with Friends and Family: Not on file  . Attends Religious Services: Not on file  .  Active Member of Clubs or Organizations: Not on file  . Attends Archivist Meetings: Not on file  . Marital Status: Not on file  Intimate Partner Violence:   . Fear of Current or Ex-Partner: Not on file  . Emotionally Abused: Not on file  . Physically Abused: Not on file  . Sexually Abused: Not on file    Family History  Problem Relation Age of Onset  . Hypertension Father   . Atrial fibrillation Father   . COPD Maternal Grandmother   . Atrial fibrillation Maternal Grandmother   . Heart failure Maternal Grandfather   . Breast cancer Paternal Grandmother     The following portions of the patient's history were reviewed and updated as appropriate: allergies, current medications, past OB history, past medical history, past surgical history, past family  history, past social history, and problem list.    OBJECTIVE: Initial Physical Exam (New OB)  GENERAL APPEARANCE: alert, well appearing, in no apparent distress, oriented to person, place and time HEAD: normocephalic, atraumatic MOUTH: mucous membranes moist, pharynx normal without lesions THYROID: no thyromegaly or masses present BREASTS: no masses noted, no significant tenderness, no palpable axillary nodes, no skin changes LUNGS: clear to auscultation, no wheezes, rales or rhonchi, symmetric air entry HEART: regular rate and rhythm, no murmurs ABDOMEN: soft, nontender, nondistended, no abnormal masses, no epigastric pain, fundus soft, nontender 13 weeks size and FHT present EXTREMITIES: no redness or tenderness in the calves or thighs, no edema, no limitation in range of motion, intact peripheral pulses SKIN: normal coloration and turgor, no rashes LYMPH NODES: no adenopathy palpable NEUROLOGIC: alert, oriented, normal speech, no focal findings or movement disorder noted  PELVIC EXAM EXTERNAL GENITALIA: normal appearing vulva with no masses, tenderness or lesions VAGINA: no abnormal discharge or lesions CERVIX: no lesions or cervical motion tenderness UTERUS: gravid ADNEXA: no masses palpable and nontender OB EXAM PELVIMETRY: appears adequate RECTUM: exam not indicated  ASSESSMENT: Normal pregnancy  PLAN: New OB counseling: The patient has been given an overview regarding routine prenatal care. Recommendations regarding diet, weight gain, and exercise in pregnancy were given. Prenatal testing, optional genetic testing, carrier screening,  and ultrasound use in pregnancy were reviewed.  Maternit negative. Benefits of Breast Feeding were discussed. The patient is encouraged to consider nursing her baby post partum. Discussed use of xanax in pregnancy. Pt aware of risks, state she takes it only as needed when she can not get panic attacks under control (maybe 2 x month). Discussed  neonatal abstinence syndrome. She verbalizes and agrees to plan . Follow up 4 wks.   Philip Aspen, CNM

## 2019-05-10 LAB — CYTOLOGY - PAP: Diagnosis: NEGATIVE

## 2019-05-10 LAB — URINE CULTURE

## 2019-05-19 NOTE — L&D Delivery Note (Signed)
       Delivery Note   Danielle Wood is a 29 y.o. G2P1001 at [redacted]w[redacted]d Estimated Date of Delivery: 11/11/19  PRE-OPERATIVE DIAGNOSIS:  1) [redacted]w[redacted]d pregnancy.   POST-OPERATIVE DIAGNOSIS:  1) [redacted]w[redacted]d pregnancy s/p Vaginal, Spontaneous   Delivery Type: Vaginal, Spontaneous    Delivery Anesthesia: Epidural   Labor Complications:  none    ESTIMATED BLOOD LOSS: 500 ml    FINDINGS:   1) female infant, Apgar scores of 8   at 1 minute and 9   at 5 minutes and a birthweight of 147.09  ounces.    2) Nuchal cord: no   SPECIMENS:   PLACENTA:   Appearance: Intact , 3 vessel cord, cord blood collected   Removal: Spontaneous      Disposition:  per protocol   DISPOSITION:  Infant to left in stable condition in the delivery room, with L&D personnel and mother,  NARRATIVE SUMMARY: Labor course:  [name] is a G2P1001 at [redacted]w[redacted]d who presented for labor management.  She progressed well in labor with pitocin.  She received the appropriate anesthesia and proceeded to complete dilation. She evidenced good maternal expulsive effort during the second stage. She went on to deliver a viable female infant. The placenta delivered without problems and was noted to be complete. A perineal and vaginal examination was performed. Episiotomy/Lacerations: 1st degree;Perineal  Laceration was repaired with 4-0 Vicryl suture. The patient tolerated this well.  Doreene Burke, CNM  11/07/2019 5:12 PM

## 2019-06-05 ENCOUNTER — Encounter: Payer: Self-pay | Admitting: Certified Nurse Midwife

## 2019-06-05 ENCOUNTER — Ambulatory Visit (INDEPENDENT_AMBULATORY_CARE_PROVIDER_SITE_OTHER): Payer: Managed Care, Other (non HMO) | Admitting: Certified Nurse Midwife

## 2019-06-05 ENCOUNTER — Other Ambulatory Visit: Payer: Self-pay

## 2019-06-05 VITALS — BP 108/64 | HR 76 | Wt 176.4 lb

## 2019-06-05 DIAGNOSIS — Z3482 Encounter for supervision of other normal pregnancy, second trimester: Secondary | ICD-10-CM

## 2019-06-05 LAB — POCT URINALYSIS DIPSTICK OB
Bilirubin, UA: NEGATIVE
Blood, UA: NEGATIVE
Glucose, UA: NEGATIVE
Ketones, UA: NEGATIVE
Leukocytes, UA: NEGATIVE
Nitrite, UA: NEGATIVE
POC,PROTEIN,UA: NEGATIVE
Spec Grav, UA: >=1.03 — AB (ref 1.010–1.025)
Urobilinogen, UA: 0.2 E.U./dL
pH, UA: 5 (ref 5.0–8.0)

## 2019-06-05 NOTE — Patient Instructions (Signed)

## 2019-06-05 NOTE — Progress Notes (Signed)
ROB doing well. IS starting to feel "waves". Discussed anatomy scan next visit. Pt verbalizes and agrees to plan. She is still having some nausea but it has improved. Round ligament pain reviewed. She is starting to experience. Has a belly band. Discussed tylenol, heat/ice to back, stretches, use of chiroprator and or PT if needed. Follow up 3 wks   Doreene Burke, CNM

## 2019-06-14 ENCOUNTER — Other Ambulatory Visit: Payer: Self-pay | Admitting: Certified Nurse Midwife

## 2019-06-14 DIAGNOSIS — O26892 Other specified pregnancy related conditions, second trimester: Secondary | ICD-10-CM

## 2019-06-14 NOTE — Progress Notes (Signed)
Pt have significant pelvic pain that is affecting her ability to walk. Order placed for PT.   Doreene Burke, CNM

## 2019-06-28 ENCOUNTER — Ambulatory Visit (INDEPENDENT_AMBULATORY_CARE_PROVIDER_SITE_OTHER): Payer: Managed Care, Other (non HMO)

## 2019-06-28 ENCOUNTER — Encounter: Payer: Self-pay | Admitting: Certified Nurse Midwife

## 2019-06-28 ENCOUNTER — Ambulatory Visit (INDEPENDENT_AMBULATORY_CARE_PROVIDER_SITE_OTHER): Payer: Managed Care, Other (non HMO) | Admitting: Certified Nurse Midwife

## 2019-06-28 ENCOUNTER — Other Ambulatory Visit: Payer: Self-pay

## 2019-06-28 VITALS — BP 121/71 | HR 90 | Wt 184.6 lb

## 2019-06-28 DIAGNOSIS — Z3482 Encounter for supervision of other normal pregnancy, second trimester: Secondary | ICD-10-CM | POA: Diagnosis not present

## 2019-06-28 LAB — POCT URINALYSIS DIPSTICK OB
Bilirubin, UA: NEGATIVE
Blood, UA: NEGATIVE
Glucose, UA: NEGATIVE
Ketones, UA: NEGATIVE
Leukocytes, UA: NEGATIVE
Nitrite, UA: NEGATIVE
POC,PROTEIN,UA: NEGATIVE
Spec Grav, UA: 1.02 (ref 1.010–1.025)
Urobilinogen, UA: 0.2 E.U./dL
pH, UA: 5 (ref 5.0–8.0)

## 2019-06-28 NOTE — Patient Instructions (Signed)

## 2019-06-28 NOTE — Progress Notes (Signed)
ROB doing well. U/s today for anatomy (see below) resulst reviewed. Discussed low lying placental, precautions reviewed. Rp u/s @ 28 wks. Nothing in the vagina until further notice. Pt verbalizes and agrees to plan. Follow up 4 wks with Marcelino Duster.  Doreene Burke, CNM   Patient Name: Danielle Wood DOB: [birth year] MRN: [id number] ULTRASOUND REPORT  Location: [facility] Date of Service: 06/28/2019   Indications:Anatomy Ultrasound Findings:  M[place] Jim intrauterine pregnancy is visualized with FHR at 133 BPM. Biometrics give an (U/S) Gestational age of [redacted]w[redacted]d and an (U/S) EDD of 11/09/2019; this correlates with the clinically established Estimated Date of Delivery: 11/11/19  Fetal presentation is Variable.  EFW: 395 g ( 14oz). Fetal Percentile  Placenta: low lying. Grade: 1 AFI: subjectively normal.  Anatomic survey is complete and normal; Gender - female.    Right Ovary is normal in appearance. Left Ovary is normal appearance. Survey of the adnexa demonstrates no adnexal masses. There is no free peritoneal fluid in the cul de sac.  Impression: 1. [redacted]w[redacted]d Viable Singleton Intrauterine pregnancy by U/S. 2. (U/S) EDD is consistent with Clinically established Estimated Date of Delivery: 11/11/19 . 3. Normal Anatomy Scan 4.Placenta: low lying  Recommendations: 1.Clinical correlation with the patient's History and Physical Exam.

## 2019-07-27 ENCOUNTER — Ambulatory Visit (INDEPENDENT_AMBULATORY_CARE_PROVIDER_SITE_OTHER): Payer: Managed Care, Other (non HMO) | Admitting: Certified Nurse Midwife

## 2019-07-27 ENCOUNTER — Other Ambulatory Visit: Payer: Self-pay

## 2019-07-27 VITALS — BP 110/64 | HR 77 | Wt 189.1 lb

## 2019-07-27 DIAGNOSIS — O4442 Low lying placenta NOS or without hemorrhage, second trimester: Secondary | ICD-10-CM

## 2019-07-27 DIAGNOSIS — O360120 Maternal care for anti-D [Rh] antibodies, second trimester, not applicable or unspecified: Secondary | ICD-10-CM

## 2019-07-27 DIAGNOSIS — O26899 Other specified pregnancy related conditions, unspecified trimester: Secondary | ICD-10-CM | POA: Insufficient documentation

## 2019-07-27 DIAGNOSIS — Z3492 Encounter for supervision of normal pregnancy, unspecified, second trimester: Secondary | ICD-10-CM

## 2019-07-27 DIAGNOSIS — O4440 Low lying placenta NOS or without hemorrhage, unspecified trimester: Secondary | ICD-10-CM | POA: Insufficient documentation

## 2019-07-27 DIAGNOSIS — O26892 Other specified pregnancy related conditions, second trimester: Secondary | ICD-10-CM

## 2019-07-27 DIAGNOSIS — F410 Panic disorder [episodic paroxysmal anxiety] without agoraphobia: Secondary | ICD-10-CM | POA: Insufficient documentation

## 2019-07-27 DIAGNOSIS — Z6791 Unspecified blood type, Rh negative: Secondary | ICD-10-CM | POA: Insufficient documentation

## 2019-07-27 DIAGNOSIS — Z3A24 24 weeks gestation of pregnancy: Secondary | ICD-10-CM

## 2019-07-27 DIAGNOSIS — O09892 Supervision of other high risk pregnancies, second trimester: Secondary | ICD-10-CM

## 2019-07-27 DIAGNOSIS — Z8759 Personal history of other complications of pregnancy, childbirth and the puerperium: Secondary | ICD-10-CM | POA: Insufficient documentation

## 2019-07-27 LAB — POCT URINALYSIS DIPSTICK OB
Bilirubin, UA: NEGATIVE
Blood, UA: NEGATIVE
Glucose, UA: NEGATIVE
Ketones, UA: NEGATIVE
Nitrite, UA: NEGATIVE
POC,PROTEIN,UA: NEGATIVE
Spec Grav, UA: >=1.03 — AB (ref 1.010–1.025)
Urobilinogen, UA: 0.2 E.U./dL
pH, UA: 6 (ref 5.0–8.0)

## 2019-07-27 NOTE — Progress Notes (Signed)
ROB-No complaints.  

## 2019-07-27 NOTE — Progress Notes (Signed)
Danielle Wood-Doing well. Reports increased Xanax use since last visit due to anxiety regarding low lying placenta. Offered daily medication or alternatives to xanax and patient declines at this time. Plans expressed breastmilk, IV/epidural for pain relief, and POP as contraception. Uses [facility]. Anticipatory guidance regarding course of prenatal care. Reviewed red flag symptoms and when to call. RTC x 4 weeks for follow up ultrasound, 28 week labs, TDaP/Rhogam and Danielle Wood.

## 2019-08-23 ENCOUNTER — Ambulatory Visit (INDEPENDENT_AMBULATORY_CARE_PROVIDER_SITE_OTHER): Payer: Managed Care, Other (non HMO) | Admitting: Certified Nurse Midwife

## 2019-08-23 ENCOUNTER — Other Ambulatory Visit: Payer: Self-pay

## 2019-08-23 ENCOUNTER — Other Ambulatory Visit: Payer: Managed Care, Other (non HMO)

## 2019-08-23 ENCOUNTER — Ambulatory Visit (INDEPENDENT_AMBULATORY_CARE_PROVIDER_SITE_OTHER): Payer: Managed Care, Other (non HMO)

## 2019-08-23 ENCOUNTER — Encounter: Payer: Self-pay | Admitting: Certified Nurse Midwife

## 2019-08-23 VITALS — BP 116/75 | HR 74 | Wt 191.4 lb

## 2019-08-23 DIAGNOSIS — Z3A24 24 weeks gestation of pregnancy: Secondary | ICD-10-CM | POA: Diagnosis not present

## 2019-08-23 DIAGNOSIS — Z3A28 28 weeks gestation of pregnancy: Secondary | ICD-10-CM

## 2019-08-23 DIAGNOSIS — Z3492 Encounter for supervision of normal pregnancy, unspecified, second trimester: Secondary | ICD-10-CM | POA: Diagnosis not present

## 2019-08-23 DIAGNOSIS — O4440 Low lying placenta NOS or without hemorrhage, unspecified trimester: Secondary | ICD-10-CM | POA: Diagnosis not present

## 2019-08-23 DIAGNOSIS — O360130 Maternal care for anti-D [Rh] antibodies, third trimester, not applicable or unspecified: Secondary | ICD-10-CM | POA: Diagnosis not present

## 2019-08-23 DIAGNOSIS — Z23 Encounter for immunization: Secondary | ICD-10-CM | POA: Diagnosis not present

## 2019-08-23 DIAGNOSIS — Z3483 Encounter for supervision of other normal pregnancy, third trimester: Secondary | ICD-10-CM

## 2019-08-23 LAB — POCT URINALYSIS DIPSTICK OB
Bilirubin, UA: NEGATIVE
Blood, UA: NEGATIVE
Glucose, UA: NEGATIVE
Ketones, UA: NEGATIVE
Leukocytes, UA: NEGATIVE
Nitrite, UA: NEGATIVE
POC,PROTEIN,UA: NEGATIVE
Spec Grav, UA: 1.01 (ref 1.010–1.025)
Urobilinogen, UA: 0.2 E.U./dL
pH, UA: 5 (ref 5.0–8.0)

## 2019-08-23 MED ORDER — TETANUS-DIPHTH-ACELL PERTUSSIS 5-2.5-18.5 LF-MCG/0.5 IM SUSP
0.5000 mL | Freq: Once | INTRAMUSCULAR | Status: AC
  Administered 2019-08-23: 0.5 mL via INTRAMUSCULAR

## 2019-08-23 MED ORDER — RHO D IMMUNE GLOBULIN 1500 UNITS IM SOSY
1500.0000 [IU] | PREFILLED_SYRINGE | Freq: Once | INTRAMUSCULAR | Status: AC
  Administered 2019-08-23: 1500 [IU] via INTRAMUSCULAR

## 2019-08-23 NOTE — Patient Instructions (Signed)
[facility] Pediatrician List   [facility]  [address] [place], [place], [place] [address]  Phone: [phone]   [facility] (second location)  [address] [place], [place] [address]  Phone: [phone]   [facility]) [address] [place], [place], [place] [address] Phone: [phone]   [facility]  [address] [place], [place] [address]  Phone: [phone] (Tetanus, Diphtheria) Vaccine: What You Need to Know 1. Why get vaccinated? Td vaccine can prevent tetanus and diphtheria. Tetanus enters the body through cuts or wounds. Diphtheria spreads from person to person.  TETANUS (T) causes painful stiffening of the muscles. Tetanus can lead to serious health problems, including being unable to open the mouth, having trouble swallowing and breathing, or death.  DIPHTHERIA (D) can lead to difficulty breathing, heart failure, paralysis, or death. 2. Td vaccine Td is only for children 7 years and older, adolescents, and adults.  Td is usually given as a booster dose every 10 years, but it can also be given earlier after a severe and dirty wound or burn. Another vaccine, called Tdap, that protects against pertussis, also known as "whooping cough," in addition to tetanus and diphtheria, may be used instead of Td.  Td may be given at the same time as other vaccines. 3. Talk with your health care provider Tell your vaccine provider if the person getting the vaccine:  Has had an allergic reaction after a previous dose of any vaccine that protects against tetanus or diphtheria, or has any severe, life-threatening allergies.  Has ever had Guillain-Barr Syndrome (also called GBS).  Has had severe pain or swelling after a previous dose of any vaccine that protects against tetanus or diphtheria. In some cases, your health care provider may decide to postpone Td vaccination to a future visit.  People with minor illnesses, such as a  cold, may be vaccinated. People who are moderately or severely ill should usually wait until they recover before getting Td vaccine.  Your health care provider can give you more information. 4. Risks of a vaccine reaction  Pain, redness, or swelling where the shot was given, mild fever, headache, feeling tired, and nausea, vomiting, diarrhea, or stomachache sometimes happen after Td vaccine. People sometimes faint after medical procedures, including vaccination. Tell your provider if you feel dizzy or have vision changes or ringing in the ears.  As with any medicine, there is a very remote chance of a vaccine causing a severe allergic reaction, other serious injury, or death. 5. What if there is a serious problem? An allergic reaction could occur after the vaccinated person leaves the clinic. If you see signs of a severe allergic reaction (hives, swelling of the face and throat, difficulty breathing, a fast heartbeat, dizziness, or weakness), call 9-1-1 and get the person to the nearest hospital.  For other signs that concern you, call your health care provider.  Adverse reactions should be reported to the Vaccine Adverse Event Reporting System (VAERS). Your health care provider will usually file this report, or you can do it yourself. Visit the VAERS website at www.vaers.LAgents.no or call 903-789-6852. VAERS is only for reporting reactions, and VAERS staff do not give medical advice. 6. The National Vaccine Injury Compensation Program The [employer] Vaccine Injury Compensation Program (VICP) is a federal program that was created to compensate people who may have been injured by certain vaccines. Visit the VICP website at SpiritualWord.at or call 703-044-5793 to learn about the program and about filing a  claim. There is a time limit to file a claim for compensation. 7. How can I learn more?  Ask your health care provider.  Call your local or state health department.  Contact the  Centers for Disease Control and Prevention (CDC): ? Call 336-279-7971 (1-800-CDC-INFO) or ? Visit CDC's website at PicCapture.uy Vaccine Information Statement Td Vaccine (08/17/18) This information is not intended to replace advice given to you by your health care provider. Make sure you discuss any questions you have with your health care provider. Document Revised: 09/26/2018 Document Reviewed: 08/29/2018 Elsevier Patient Education  2020 Elsevier Inc. Glucose Tolerance Test During Pregnancy Why am I having this test? The glucose tolerance test (GTT) is done to check how your body processes sugar (glucose). This is one of several tests used to diagnose diabetes that develops during pregnancy (gestational diabetes mellitus). Gestational diabetes is a temporary form of diabetes that some women develop during pregnancy. It usually occurs during the second trimester of pregnancy and goes away after delivery. Testing (screening) for gestational diabetes usually occurs between 24 and 28 weeks of pregnancy. You may have the GTT test after having a 1-hour glucose screening test if the results from that test indicate that you may have gestational diabetes. You may also have this test if:  You have a history of gestational diabetes.  You have a history of giving birth to very large babies or have experienced repeated fetal loss (stillbirth).  You have signs and symptoms of diabetes, such as: ? Changes in your vision. ? Tingling or numbness in your hands or feet. ? Changes in hunger, thirst, and urination that are not otherwise explained by your pregnancy. What is being tested? This test measures the amount of glucose in your blood at different times during a period of 3 hours. This indicates how well your body is able to process glucose. What kind of sample is taken?  Blood samples are required for this test. They are usually collected by inserting a needle into a blood vessel. How do I  prepare for this test?  For 3 days before your test, eat normally. Have plenty of carbohydrate-rich foods.  Follow instructions from your health care provider about: ? Eating or drinking restrictions on the day of the test. You may be asked to not eat or drink anything other than water (fast) starting 8-10 hours before the test. ? Changing or stopping your regular medicines. Some medicines may interfere with this test. Tell a health care provider about:  All medicines you are taking, including vitamins, herbs, eye drops, creams, and over-the-counter medicines.  Any blood disorders you have.  Any surgeries you have had.  Any medical conditions you have. What happens during the test? First, your blood glucose will be measured. This is referred to as your fasting blood glucose, since you fasted before the test. Then, you will drink a glucose solution that contains a certain amount of glucose. Your blood glucose will be measured again 1, 2, and 3 hours after drinking the solution. This test takes about 3 hours to complete. You will need to stay at the testing location during this time. During the testing period:  Do not eat or drink anything other than the glucose solution.  Do not exercise.  Do not use any products that contain nicotine or tobacco, such as cigarettes and e-cigarettes. If you need help stopping, ask your health care provider. The testing procedure may vary among health care providers and hospitals. How are the results reported?  Your results will be reported as milligrams of glucose per deciliter of blood (mg/dL) or millimoles per liter (mmol/L). Your health care provider will compare your results to normal ranges that were established after testing a large group of people (reference ranges). Reference ranges may vary among labs and hospitals. For this test, common reference ranges are:  Fasting: less than 95-105 mg/dL (5.3-5.8 mmol/L).  1 hour after drinking glucose: less  than 180-190 mg/dL (10.0-10.5 mmol/L).  2 hours after drinking glucose: less than 155-165 mg/dL (8.6-9.2 mmol/L).  3 hours after drinking glucose: 140-145 mg/dL (7.8-8.1 mmol/L). What do the results mean? Results within reference ranges are considered normal, meaning that your glucose levels are well-controlled. If two or more of your blood glucose levels are high, you may be diagnosed with gestational diabetes. If only one level is high, your health care provider may suggest repeat testing or other tests to confirm a diagnosis. Talk with your health care provider about what your results mean. Questions to ask your health care provider Ask your health care provider, or the department that is doing the test:  When will my results be ready?  How will I get my results?  What are my treatment options?  What other tests do I need?  What are my next steps? Summary  The glucose tolerance test (GTT) is one of several tests used to diagnose diabetes that develops during pregnancy (gestational diabetes mellitus). Gestational diabetes is a temporary form of diabetes that some women develop during pregnancy.  You may have the GTT test after having a 1-hour glucose screening test if the results from that test indicate that you may have gestational diabetes. You may also have this test if you have any symptoms or risk factors for gestational diabetes.  Talk with your health care provider about what your results mean. This information is not intended to replace advice given to you by your health care provider. Make sure you discuss any questions you have with your health care provider. Document Revised: 08/25/2018 Document Reviewed: 12/14/2016 Elsevier Patient Education  [place].

## 2019-08-23 NOTE — Progress Notes (Signed)
ROB and follow up u/s for placenta location ( see below) . Glucose screen/RPR/CBC/Tdap/BTC today. Discussed birth control after baby, information given. Sample birth plan given. Follow up on Birth plan next visit. Reviewed u/s results. Discussed RSB, see check list for topics covered. Follow up ROB 2 wks with Marcelino Duster   Patient Name: Danielle Wood DOB: 1990-12-26 MRN: [id number] ULTRASOUND REPORT  Location: [facility] Date of Service: 08/23/2019   Indications: Placenta follow up ultrasound  Findings:  Mason Jim intrauterine pregnancy is visualized with FHR at 119 BPM.  Fetal presentation is Breech.  Placenta: anterior. Grade: 1 AFI: subjectively normal.     There is no free peritoneal fluid in the cul de sac.  Impression: 1. [redacted]w[redacted]d Viable Singleton Intrauterine pregnancy previously established criteria. 2. Placenta as moved away from the internal OS.  Recommendations: 1.Clinical correlation with the patient's History and Physical Exam.   Jenine M. Marciano Sequin    RDMS

## 2019-08-24 LAB — CBC
Hematocrit: 33.8 % — ABNORMAL LOW (ref 34.0–46.6)
Hemoglobin: 11.8 g/dL (ref 11.1–15.9)
MCH: 32.1 pg (ref 26.6–33.0)
MCHC: 34.9 g/dL (ref 31.5–35.7)
MCV: 92 fL (ref 79–97)
Platelets: 203 10*3/uL (ref 150–450)
RBC: 3.68 x10E6/uL — ABNORMAL LOW (ref 3.77–5.28)
RDW: 12.3 % (ref 11.7–15.4)
WBC: 7.4 10*3/uL (ref 3.4–10.8)

## 2019-08-24 LAB — GLUCOSE, 1 HOUR GESTATIONAL: Gestational Diabetes Screen: 66 mg/dL (ref 65–139)

## 2019-08-24 LAB — RPR: RPR Ser Ql: NONREACTIVE

## 2019-09-05 ENCOUNTER — Encounter: Payer: Self-pay | Admitting: Certified Nurse Midwife

## 2019-09-05 ENCOUNTER — Ambulatory Visit (INDEPENDENT_AMBULATORY_CARE_PROVIDER_SITE_OTHER): Payer: Managed Care, Other (non HMO) | Admitting: Certified Nurse Midwife

## 2019-09-05 ENCOUNTER — Other Ambulatory Visit: Payer: Self-pay

## 2019-09-05 VITALS — BP 126/79 | HR 104 | Wt 192.0 lb

## 2019-09-05 DIAGNOSIS — Z3A30 30 weeks gestation of pregnancy: Secondary | ICD-10-CM | POA: Diagnosis not present

## 2019-09-05 LAB — POCT URINALYSIS DIPSTICK OB
Bilirubin, UA: NEGATIVE
Blood, UA: NEGATIVE
Glucose, UA: NEGATIVE
Leukocytes, UA: NEGATIVE
Nitrite, UA: NEGATIVE
Spec Grav, UA: >=1.03 — AB (ref 1.010–1.025)
Urobilinogen, UA: 0.2 E.U./dL
pH, UA: 6 (ref 5.0–8.0)

## 2019-09-05 NOTE — Progress Notes (Signed)
ROB doing well. Feels good movement. Discussed upcoming visit 2q 2 wk until 36 wks, reviewed GBS testing @ 36 wks. Discussed COVID testing in L&D . She verbalizes and agrees to plan. Follow up 2 wk with Marcelino Duster.   Doreene Burke, CNM

## 2019-09-05 NOTE — Patient Instructions (Signed)
[facility] Pediatrician List  [facility]  [address], [place], [place] [address]  Phone: [phone]  [facility] (second location)  [address] [place], [place] [address]  Phone: [phone]  [facility]) [address], [place], [place] [address] Phone: [phone]  [facility]  [address] , [place] [address]  Phone: [phone] 

## 2019-09-11 ENCOUNTER — Telehealth: Payer: Self-pay

## 2019-09-11 NOTE — Telephone Encounter (Signed)
mychart message sent to patient

## 2019-09-21 ENCOUNTER — Ambulatory Visit (INDEPENDENT_AMBULATORY_CARE_PROVIDER_SITE_OTHER): Payer: Managed Care, Other (non HMO) | Admitting: Certified Nurse Midwife

## 2019-09-21 ENCOUNTER — Other Ambulatory Visit: Payer: Self-pay

## 2019-09-21 VITALS — BP 126/74 | HR 89 | Wt 197.2 lb

## 2019-09-21 DIAGNOSIS — Z3483 Encounter for supervision of other normal pregnancy, third trimester: Secondary | ICD-10-CM

## 2019-09-21 DIAGNOSIS — F419 Anxiety disorder, unspecified: Secondary | ICD-10-CM

## 2019-09-21 DIAGNOSIS — F410 Panic disorder [episodic paroxysmal anxiety] without agoraphobia: Secondary | ICD-10-CM

## 2019-09-21 DIAGNOSIS — Z3A32 32 weeks gestation of pregnancy: Secondary | ICD-10-CM

## 2019-09-21 DIAGNOSIS — O99343 Other mental disorders complicating pregnancy, third trimester: Secondary | ICD-10-CM

## 2019-09-21 LAB — POCT URINALYSIS DIPSTICK OB
Bilirubin, UA: NEGATIVE
Blood, UA: NEGATIVE
Glucose, UA: NEGATIVE
Ketones, UA: NEGATIVE
Leukocytes, UA: NEGATIVE
Nitrite, UA: NEGATIVE
POC,PROTEIN,UA: NEGATIVE
Spec Grav, UA: 1.02 (ref 1.010–1.025)
Urobilinogen, UA: 0.2 E.U./dL
pH, UA: 5 (ref 5.0–8.0)

## 2019-09-21 MED ORDER — CITALOPRAM HYDROBROMIDE 10 MG PO TABS: 10.0000 mg | ORAL_TABLET | Freq: Every day | ORAL | 1 refills | Status: DC

## 2019-09-21 NOTE — Patient Instructions (Addendum)
Perinatal Anxiety When a woman feels excessive tension or worry (anxiety) during pregnancy or during the first 12 months after she gives birth, she has a condition called perinatal anxiety. Anxiety can interfere with work, school, relationships, and other everyday activities. If it is not managed properly, it can also cause problems in the mother and her baby.  If you are pregnant and you have symptoms of an anxiety disorder, it is important to talk with your health care provider. What are the causes? The exact cause of this condition is not known. Hormonal changes during and after pregnancy may play a role in causing perinatal anxiety. What increases the risk? You are more likely to develop this condition if:  You have a personal or family history of depression, anxiety, or mood disorders.  You experience a stressful life event during pregnancy, such as the death of a loved one.  You have a lot of regular life stress, such as being a single parent.  You have thyroid problems. What are the signs or symptoms? Perinatal anxiety can be different for everyone. It may include:  Panic attacks (panic disorder). These are intense episodes of fear or discomfort that may also cause sweating, nausea, shortness of breath, or fear of dying. They usually last 5-15 minutes.  Reliving an upsetting (traumatic) event through distressing thoughts, dreams, or flashbacks (post-traumatic stress disorder, or PTSD).  Excessive worry about multiple problems (generalized anxiety disorder).  Fear and stress about leaving certain people or loved ones (separation anxiety).  Performing repetitive tasks (compulsions) to relieve stress or worry (obsessive compulsive disorder, or OCD).  Fear of certain objects or situations (phobias).  Excessive worrying, such as a constant feeling that something bad is going to happen.  Inability to relax.  Difficulty concentrating.  Sleep problems.  Frequent nightmares or  disturbing thoughts. How is this diagnosed? This condition is diagnosed based on a physical exam and mental evaluation. In some cases, your health care provider may use an anxiety screening tool. These tools include a list of questions that can help a health care provider diagnose anxiety. Your health care provider may refer you to a mental health expert who specializes in anxiety. How is this treated? This condition may be treated with:  Medicines. Your health care provider will only give you medicines that have been proven safe for pregnancy and breastfeeding.  Talk therapy with a mental health professional to help change your patterns of thinking (cognitive behavioral therapy).  Mindfulness-based stress reduction.  Other relaxation therapies, such as deep breathing or guided muscle relaxation.  Support groups. Follow these instructions at home: Lifestyle  Do not use any products that contain nicotine or tobacco, such as cigarettes and e-cigarettes. If you need help quitting, ask your health care provider.  Do not use alcohol when you are pregnant. After your baby is born, limit alcohol intake to no more than 1 drink a day. One drink equals 12 oz of beer, 5 oz of wine, or 1 oz of hard liquor.  Consider joining a support group for new mothers. Ask your health care provider for recommendations.  Take good care of yourself. Make sure you: ? Get plenty of sleep. If you are having trouble sleeping, talk with your health care provider. ? Eat a healthy diet. This includes plenty of fruits and vegetables, whole grains, and lean proteins. ? Exercise regularly, as told by your health care provider. Ask your health care provider what exercises are safe for you. General instructions  Take over-the-counter  and prescription medicines only as told by your health care provider.  Talk with your partner or family members about your feelings during pregnancy. Share any concerns or fears that you may  have.  Ask for help with tasks or chores when you need it. Ask friends and family members to provide meals, watch your children, or help with cleaning.  Keep all follow-up visits as told by your health care provider. This is important. Contact a health care provider if:  You (or people close to you) notice that you have any symptoms of anxiety or depression.  You have anxiety and your symptoms get worse.  You experience side effects from medicines, such as nausea or sleep problems. Get help right away if:  You feel like hurting yourself, your baby, or someone else. If you ever feel like you may hurt yourself or others, or have thoughts about taking your own life, get help right away. You can go to your nearest emergency department or call:  Your local emergency services (911 in the [place]  A suicide crisis helpline, such as the National Suicide Prevention Lifeline at (715) 884-6778. This is open 24 hours a day. Summary  Perinatal anxiety is when a woman feels excessive tension or worry during pregnancy or during the first 12 months after she gives birth.  Perinatal anxiety may include panic attacks, post-traumatic stress disorder, separation anxiety, phobias, or generalized anxiety.  Perinatal anxiety can cause physical health problems in the mother and baby if not properly managed.  This condition is treated with medicines, talk therapy, stress reduction therapies, or a combination of two or more treatments.  Talk with your partner or family members about your concerns or fears. Do not be afraid to ask for help. This information is not intended to replace advice given to you by your health care provider. Make sure you discuss any questions you have with your health care provider. Document Revised: 05/07/2017 Document Reviewed: 07/01/2016 Elsevier Patient Education  2020 Elsevier Inc.  Citalopram tablets What is this medicine? CITALOPRAM (sye TAL oh pram) is a medicine for  depression. This medicine may be used for other purposes; ask your health care provider or pharmacist if you have questions. COMMON BRAND NAME(S): Celexa What should I tell my health care provider before I take this medicine? They need to know if you have any of these conditions:  bleeding disorders  bipolar disorder or a family history of bipolar disorder  glaucoma  heart disease  history of irregular heartbeat  kidney disease  liver disease  low levels of magnesium or potassium in the blood  receiving electroconvulsive therapy  seizures  suicidal thoughts, plans, or attempt; a previous suicide attempt by you or a family member  take medicines that treat or prevent blood clots  thyroid disease  an unusual or allergic reaction to citalopram, escitalopram, other medicines, foods, dyes, or preservatives  pregnant or trying to become pregnant  breast-feeding How should I use this medicine? Take this medicine by mouth with a glass of water. Follow the directions on the prescription label. You can take it with or without food. Take your medicine at regular intervals. Do not take your medicine more often than directed. Do not stop taking this medicine suddenly except upon the advice of your doctor. Stopping this medicine too quickly may cause serious side effects or your condition may worsen. A special MedGuide will be given to you by the pharmacist with each prescription and refill. Be sure to read this information  carefully each time. Talk to your pediatrician regarding the use of this medicine in children. Special care may be needed. Patients over 20 years old may have a stronger reaction and need a smaller dose. Overdosage: If you think you have taken too much of this medicine contact a poison control center or emergency room at once. NOTE: This medicine is only for you. Do not share this medicine with others. What if I miss a dose? If you miss a dose, take it as soon as  you can. If it is almost time for your next dose, take only that dose. Do not take double or extra doses. What may interact with this medicine? Do not take this medicine with any of the following medications:  certain medicines for fungal infections like fluconazole, itraconazole, ketoconazole, posaconazole, voriconazole  cisapride  dronedarone  escitalopram  linezolid  MAOIs like Carbex, Eldepryl, Marplan, Nardil, and Parnate  methylene blue (injected into a vein)  pimozide  thioridazine This medicine may also interact with the following medications:  alcohol  amphetamines  aspirin and aspirin-like medicines  carbamazepine  certain medicines for depression, anxiety, or psychotic disturbances  certain medicines for infections like chloroquine, clarithromycin, erythromycin, furazolidone, isoniazid, pentamidine  certain medicines for migraine headaches like almotriptan, eletriptan, frovatriptan, naratriptan, rizatriptan, sumatriptan, zolmitriptan  certain medicines for sleep  certain medicines that treat or prevent blood clots like dalteparin, enoxaparin, warfarin  cimetidine  diuretics  dofetilide  fentanyl  lithium  methadone  metoprolol  NSAIDs, medicines for pain and inflammation, like ibuprofen or naproxen  omeprazole  other medicines that prolong the QT interval (cause an abnormal heart rhythm)  procarbazine  rasagiline  supplements like St. John's wort, kava kava, valerian  tramadol  tryptophan  ziprasidone This list may not describe all possible interactions. Give your health care provider a list of all the medicines, herbs, non-prescription drugs, or dietary supplements you use. Also tell them if you smoke, drink alcohol, or use illegal drugs. Some items may interact with your medicine. What should I watch for while using this medicine? Tell your doctor if your symptoms do not get better or if they get worse. Visit your doctor or health  care professional for regular checks on your progress. Because it may take several weeks to see the full effects of this medicine, it is important to continue your treatment as prescribed by your doctor. Patients and their families should watch out for new or worsening thoughts of suicide or depression. Also watch out for sudden changes in feelings such as feeling anxious, agitated, panicky, irritable, hostile, aggressive, impulsive, severely restless, overly excited and hyperactive, or not being able to sleep. If this happens, especially at the beginning of treatment or after a change in dose, call your health care professional. Dennis Bast may get drowsy or dizzy. Do not drive, use machinery, or do anything that needs mental alertness until you know how this medicine affects you. Do not stand or sit up quickly, especially if you are an older patient. This reduces the risk of dizzy or fainting spells. Alcohol may interfere with the effect of this medicine. Avoid alcoholic drinks. Your mouth may get dry. Chewing sugarless gum or sucking hard candy, and drinking plenty of water will help. Contact your doctor if the problem does not go away or is severe. What side effects may I notice from receiving this medicine? Side effects that you should report to your doctor or health care professional as soon as possible:  allergic reactions like skin  rash, itching or hives, swelling of the face, lips, or tongue  anxious  black, tarry stools  breathing problems  changes in vision  chest pain  confusion  elevated mood, decreased need for sleep, racing thoughts, impulsive behavior  eye pain  fast, irregular heartbeat  feeling faint or lightheaded, falls  feeling agitated, angry, or irritable  hallucination, loss of contact with reality  loss of balance or coordination  loss of memory  painful or prolonged erections  restlessness, pacing, inability to keep still  seizures  stiff  muscles  suicidal thoughts or other mood changes  trouble sleeping  unusual bleeding or bruising  unusually weak or tired  vomiting Side effects that usually do not require medical attention (report to your doctor or health care professional if they continue or are bothersome):  change in appetite or weight  change in sex drive or performance  dizziness  headache  increased sweating  indigestion, nausea  tremors This list may not describe all possible side effects. Call your doctor for medical advice about side effects. You may report side effects to FDA at 1-800-FDA-1088. Where should I keep my medicine? Keep out of reach of children. Store at room temperature between 15 and 30 degrees C (59 and 86 degrees F). Throw away any unused medicine after the expiration date. NOTE: This sheet is a summary. It may not cover all possible information. If you have questions about this medicine, talk to your doctor, pharmacist, or health care provider.  2020 Elsevier/Gold Standard (2018-04-25 09:05:36)

## 2019-09-21 NOTE — Progress Notes (Signed)
ROB-Reports increased anxiety over the last week. Has used Xanax to help with panic attacks that lead to vomiting. Discussed home treatment measures. Rx Celexa, see orders. Declined counseling. Anticipatory guidance regarding course of prenatal care. Reviewed red flag symptoms and when to call. RTC x 2 weeks for ROB and mood check or sooner if needed.   GAD 7 : Generalized Anxiety Score 09/21/2019 09/05/2019 08/23/2019 07/27/2019  Nervous, Anxious, on Edge 3 1 1 1   Control/stop worrying 3 0 0 1  Worry too much - different things 0 0 0 1  Trouble relaxing 1 0 0 0  Restless 0 0 0 0  Easily annoyed or irritable 0 0 0 1  Afraid - awful might happen 0 0 0 0  Total GAD 7 Score 7 1 1 4   Anxiety Difficulty Not difficult at all - - Not difficult at all

## 2019-10-03 ENCOUNTER — Other Ambulatory Visit: Payer: Self-pay

## 2019-10-03 ENCOUNTER — Ambulatory Visit (INDEPENDENT_AMBULATORY_CARE_PROVIDER_SITE_OTHER): Payer: Managed Care, Other (non HMO) | Admitting: Certified Nurse Midwife

## 2019-10-03 VITALS — BP 138/82 | HR 82 | Wt 195.4 lb

## 2019-10-03 DIAGNOSIS — Z3A34 34 weeks gestation of pregnancy: Secondary | ICD-10-CM | POA: Diagnosis not present

## 2019-10-03 LAB — POCT URINALYSIS DIPSTICK OB
Bilirubin, UA: NEGATIVE
Blood, UA: NEGATIVE
Glucose, UA: NEGATIVE
Ketones, UA: NEGATIVE
Leukocytes, UA: NEGATIVE
Nitrite, UA: NEGATIVE
POC,PROTEIN,UA: NEGATIVE
Spec Grav, UA: 1.02 (ref 1.010–1.025)
Urobilinogen, UA: 0.2 E.U./dL
pH, UA: 5 (ref 5.0–8.0)

## 2019-10-03 NOTE — Progress Notes (Signed)
ROB doing well. Feels good movement. PT state celexa working well. She states she has only had one panic attack since last visit. Discussed NAS , information given to pt. She verbalizes understanding. Reviewed. GBS testing next visit. Follow up 2 wk with Marcelino Duster.   Doreene Burke, CNM

## 2019-10-03 NOTE — Patient Instructions (Signed)
Group B Streptococcus Infection During Pregnancy °Group B Streptococcus (GBS) is a type of bacteria that is often found in healthy people. It is commonly found in the rectum, vagina, and intestines. In people who are healthy and not pregnant, the bacteria rarely cause serious illness or complications. However, women who test positive for GBS during pregnancy can pass the bacteria to the baby during childbirth. This can cause serious infection in the baby after birth. °Women with GBS may also have infections during their pregnancy or soon after childbirth. The infections include urinary tract infections (UTIs) or infections of the uterus. GBS also increases a woman's risk of complications during pregnancy, such as early labor or delivery, miscarriage, or stillbirth. Routine testing for GBS is recommended for all pregnant women. °What are the causes? °This condition is caused by bacteria called Streptococcus agalactiae. °What increases the risk? °You may have a higher risk for GBS infection during pregnancy if you had one during a past pregnancy. °What are the signs or symptoms? °In most cases, GBS infection does not cause symptoms in pregnant women. If symptoms exist, they may include: °· Labor that starts before the 37th week of pregnancy. °· A UTI or bladder infection. This may cause a fever, frequent urination, or pain and burning during urination. °· Fever during labor. There can also be a rapid heartbeat in the mother or baby. °Rare but serious symptoms of a GBS infection in women include: °· Blood infection (septicemia). This may cause fever, chills, or confusion. °· Lung infection (pneumonia). This may cause fever, chills, cough, rapid breathing, chest pain, or difficulty breathing. °· Bone, joint, skin, or soft tissue infection. °How is this diagnosed? °You may be screened for GBS between week 35 and week 37 of pregnancy. If you have symptoms of preterm labor, you may be screened earlier. This condition is  diagnosed based on lab test results from: °· A swab of fluid from the vagina and rectum. °· A urine sample. °How is this treated? °This condition is treated with antibiotic medicine. Antibiotic medicine may be given: °· To you when you go into labor, or as soon as your water breaks. The medicines will continue until after you give birth. If you are having a cesarean delivery, you do not need antibiotics unless your water has broken. °· To your baby, if he or she requires treatment. Your health care provider will check your baby to decide if he or she needs antibiotics to prevent a serious infection. °Follow these instructions at home: °· Take over-the-counter and prescription medicines only as told by your health care provider. °· Take your antibiotic medicine as told by your health care provider. Do not stop taking the antibiotic even if you start to feel better. °· Keep all pre-birth (prenatal) visits and follow-up visits as told by your health care provider. This is important. °Contact a health care provider if: °· You have pain or burning when you urinate. °· You have to urinate more often than usual. °· You have a fever or chills. °· You develop a bad-smelling vaginal discharge. °Get help right away if: °· Your water breaks. °· You go into labor. °· You have severe pain in your abdomen. °· You have difficulty breathing. °· You have chest pain. °These symptoms may represent a serious problem that is an emergency. Do not wait to see if the symptoms will go away. Get medical help right away. Call your local emergency services (911 in the [place] Do not drive yourself to   the hospital. °Summary °· GBS is a type of bacteria that is common in healthy people. °· During pregnancy, colonization with GBS can cause serious complications for you or your baby. °· Your health care provider will screen you between 35 and 37 weeks of pregnancy to determine if you are colonized with GBS. °· If you are colonized with GBS during  pregnancy, your health care provider will recommend antibiotics through an IV during labor. °· After delivery, your baby will be evaluated for complications related to potential GBS infection and may require antibiotics to prevent a serious infection. °This information is not intended to replace advice given to you by your health care provider. Make sure you discuss any questions you have with your health care provider. °Document Revised: 11/28/2018 Document Reviewed: 11/28/2018 °Elsevier Patient Education © 2020 Elsevier Inc. ° °

## 2019-10-19 ENCOUNTER — Ambulatory Visit (INDEPENDENT_AMBULATORY_CARE_PROVIDER_SITE_OTHER): Payer: BC Managed Care – PPO | Admitting: Certified Nurse Midwife

## 2019-10-19 ENCOUNTER — Other Ambulatory Visit: Payer: Self-pay

## 2019-10-19 VITALS — BP 122/74 | HR 77 | Wt 195.6 lb

## 2019-10-19 DIAGNOSIS — Z3A36 36 weeks gestation of pregnancy: Secondary | ICD-10-CM

## 2019-10-19 DIAGNOSIS — Z113 Encounter for screening for infections with a predominantly sexual mode of transmission: Secondary | ICD-10-CM

## 2019-10-19 DIAGNOSIS — Z3483 Encounter for supervision of other normal pregnancy, third trimester: Secondary | ICD-10-CM

## 2019-10-19 DIAGNOSIS — O99343 Other mental disorders complicating pregnancy, third trimester: Secondary | ICD-10-CM

## 2019-10-19 DIAGNOSIS — F419 Anxiety disorder, unspecified: Secondary | ICD-10-CM

## 2019-10-19 DIAGNOSIS — Z3685 Encounter for antenatal screening for Streptococcus B: Secondary | ICD-10-CM

## 2019-10-19 LAB — POCT URINALYSIS DIPSTICK OB
Bilirubin, UA: NEGATIVE
Blood, UA: NEGATIVE
Glucose, UA: NEGATIVE
Ketones, UA: NEGATIVE
Leukocytes, UA: NEGATIVE
Nitrite, UA: NEGATIVE
POC,PROTEIN,UA: NEGATIVE
Spec Grav, UA: 1.015 (ref 1.010–1.025)
Urobilinogen, UA: 0.2 E.U./dL
pH, UA: 5 (ref 5.0–8.0)

## 2019-10-19 NOTE — Progress Notes (Addendum)
ROB- Has increased Celexa to 20 mg and feels much better. Has not taken any more Xanax. Reports false labor the other night lasting for almost two (2) hours. Reports lower back pain and butt pressure. Labor Precautions given. Collected thirty-six (36) week cultures today. Cervical check  by Serafina Royals CNM. Anticipatory guidance given. Reviewed red flags and when to call the office.   RTC x 1 week for ROB with ANNIE or sooner if needed.   Glorious Peach RN [facility] [employer] 10/19/19 5:00 PM  GAD 7 : Generalized Anxiety Score 10/19/2019 10/03/2019 09/21/2019 09/05/2019  Nervous, Anxious, on Edge 0 0 3 1  Control/stop worrying 0 0 3 0  Worry too much - different things 0 1 0 0  Trouble relaxing 0 1 1 0  Restless 0 0 0 0  Easily annoyed or irritable 0 0 0 0  Afraid - awful might happen 0 0 0 0  Total GAD 7 Score 0 2 7 1   Anxiety Difficulty - Not difficult at all Not difficult at all -

## 2019-10-20 LAB — OB RESULTS CONSOLE GC/CHLAMYDIA: Gonorrhea: NEGATIVE

## 2019-10-20 NOTE — Progress Notes (Signed)
I have seen, interviewed, and examined the patient in conjunction with the [employer] Nurse Practitioner student and affirm the diagnosis and management plan.   Gunnar Bulla, CNM [facility] 10/20/19 9:03 AM

## 2019-10-20 NOTE — Patient Instructions (Signed)
Citalopram tablets What is this medicine? CITALOPRAM (sye TAL oh pram) is a medicine for depression. This medicine may be used for other purposes; ask your health care provider or pharmacist if you have questions. COMMON BRAND NAME(S): Celexa What should I tell my health care provider before I take this medicine? They need to know if you have any of these conditions:  bleeding disorders  bipolar disorder or a family history of bipolar disorder  glaucoma  heart disease  history of irregular heartbeat  kidney disease  liver disease  low levels of magnesium or potassium in the blood  receiving electroconvulsive therapy  seizures  suicidal thoughts, plans, or attempt; a previous suicide attempt by you or a family member  take medicines that treat or prevent blood clots  thyroid disease  an unusual or allergic reaction to citalopram, escitalopram, other medicines, foods, dyes, or preservatives  pregnant or trying to become pregnant  breast-feeding How should I use this medicine? Take this medicine by mouth with a glass of water. Follow the directions on the prescription label. You can take it with or without food. Take your medicine at regular intervals. Do not take your medicine more often than directed. Do not stop taking this medicine suddenly except upon the advice of your doctor. Stopping this medicine too quickly may cause serious side effects or your condition may worsen. A special MedGuide will be given to you by the pharmacist with each prescription and refill. Be sure to read this information carefully each time. Talk to your pediatrician regarding the use of this medicine in children. Special care may be needed. Patients over 60 years old may have a stronger reaction and need a smaller dose. Overdosage: If you think you have taken too much of this medicine contact a poison control center or emergency room at once. NOTE: This medicine is only for you. Do not share  this medicine with others. What if I miss a dose? If you miss a dose, take it as soon as you can. If it is almost time for your next dose, take only that dose. Do not take double or extra doses. What may interact with this medicine? Do not take this medicine with any of the following medications:  certain medicines for fungal infections like fluconazole, itraconazole, ketoconazole, posaconazole, voriconazole  cisapride  dronedarone  escitalopram  linezolid  MAOIs like Carbex, Eldepryl, Marplan, Nardil, and Parnate  methylene blue (injected into a vein)  pimozide  thioridazine This medicine may also interact with the following medications:  alcohol  amphetamines  aspirin and aspirin-like medicines  carbamazepine  certain medicines for depression, anxiety, or psychotic disturbances  certain medicines for infections like chloroquine, clarithromycin, erythromycin, furazolidone, isoniazid, pentamidine  certain medicines for migraine headaches like almotriptan, eletriptan, frovatriptan, naratriptan, rizatriptan, sumatriptan, zolmitriptan  certain medicines for sleep  certain medicines that treat or prevent blood clots like dalteparin, enoxaparin, warfarin  cimetidine  diuretics  dofetilide  fentanyl  lithium  methadone  metoprolol  NSAIDs, medicines for pain and inflammation, like ibuprofen or naproxen  omeprazole  other medicines that prolong the QT interval (cause an abnormal heart rhythm)  procarbazine  rasagiline  supplements like St. John's wort, kava kava, valerian  tramadol  tryptophan  ziprasidone This list may not describe all possible interactions. Give your health care provider a list of all the medicines, herbs, non-prescription drugs, or dietary supplements you use. Also tell them if you smoke, drink alcohol, or use illegal drugs. Some items may interact with   your medicine. What should I watch for while using this medicine? Tell your  doctor if your symptoms do not get better or if they get worse. Visit your doctor or health care professional for regular checks on your progress. Because it may take several weeks to see the full effects of this medicine, it is important to continue your treatment as prescribed by your doctor. Patients and their families should watch out for new or worsening thoughts of suicide or depression. Also watch out for sudden changes in feelings such as feeling anxious, agitated, panicky, irritable, hostile, aggressive, impulsive, severely restless, overly excited and hyperactive, or not being able to sleep. If this happens, especially at the beginning of treatment or after a change in dose, call your health care professional. You may get drowsy or dizzy. Do not drive, use machinery, or do anything that needs mental alertness until you know how this medicine affects you. Do not stand or sit up quickly, especially if you are an older patient. This reduces the risk of dizzy or fainting spells. Alcohol may interfere with the effect of this medicine. Avoid alcoholic drinks. Your mouth may get dry. Chewing sugarless gum or sucking hard candy, and drinking plenty of water will help. Contact your doctor if the problem does not go away or is severe. What side effects may I notice from receiving this medicine? Side effects that you should report to your doctor or health care professional as soon as possible:  allergic reactions like skin rash, itching or hives, swelling of the face, lips, or tongue  anxious  black, tarry stools  breathing problems  changes in vision  chest pain  confusion  elevated mood, decreased need for sleep, racing thoughts, impulsive behavior  eye pain  fast, irregular heartbeat  feeling faint or lightheaded, falls  feeling agitated, angry, or irritable  hallucination, loss of contact with reality  loss of balance or coordination  loss of memory  painful or prolonged  erections  restlessness, pacing, inability to keep still  seizures  stiff muscles  suicidal thoughts or other mood changes  trouble sleeping  unusual bleeding or bruising  unusually weak or tired  vomiting Side effects that usually do not require medical attention (report to your doctor or health care professional if they continue or are bothersome):  change in appetite or weight  change in sex drive or performance  dizziness  headache  increased sweating  indigestion, nausea  tremors This list may not describe all possible side effects. Call your doctor for medical advice about side effects. You may report side effects to FDA at 1-800-FDA-1088. Where should I keep my medicine? Keep out of reach of children. Store at room temperature between 15 and 30 degrees C (59 and 86 degrees F). Throw away any unused medicine after the expiration date. NOTE: This sheet is a summary. It may not cover all possible information. If you have questions about this medicine, talk to your doctor, pharmacist, or health care provider.  2020 Elsevier/Gold Standard (2018-04-25 09:05:36)  

## 2019-10-22 LAB — STREP GP B NAA: Strep Gp B NAA: NEGATIVE

## 2019-10-23 LAB — GC/CHLAMYDIA PROBE AMP
Chlamydia trachomatis, NAA: NEGATIVE
Neisseria Gonorrhoeae by PCR: NEGATIVE

## 2019-10-27 ENCOUNTER — Other Ambulatory Visit: Payer: Self-pay

## 2019-10-27 ENCOUNTER — Ambulatory Visit (INDEPENDENT_AMBULATORY_CARE_PROVIDER_SITE_OTHER): Payer: BC Managed Care – PPO | Admitting: Certified Nurse Midwife

## 2019-10-27 VITALS — BP 117/77 | HR 84 | Wt 196.9 lb

## 2019-10-27 DIAGNOSIS — Z3A37 37 weeks gestation of pregnancy: Secondary | ICD-10-CM

## 2019-10-27 DIAGNOSIS — Z3493 Encounter for supervision of normal pregnancy, unspecified, third trimester: Secondary | ICD-10-CM

## 2019-10-27 LAB — POCT URINALYSIS DIPSTICK OB
Bilirubin, UA: NEGATIVE
Blood, UA: NEGATIVE
Glucose, UA: NEGATIVE
Ketones, UA: NEGATIVE
Leukocytes, UA: NEGATIVE
Nitrite, UA: NEGATIVE
POC,PROTEIN,UA: NEGATIVE
Spec Grav, UA: 1.025 (ref 1.010–1.025)
Urobilinogen, UA: 0.2 E.U./dL
pH, UA: 5 (ref 5.0–8.0)

## 2019-10-27 NOTE — Patient Instructions (Signed)
Vaginal Delivery  Vaginal delivery means that you give birth by pushing your baby out of your birth canal (vagina). A team of health care providers will help you before, during, and after vaginal delivery. Birth experiences are unique for every woman and every pregnancy, and birth experiences vary depending on where you choose to give birth. What happens when I arrive at the birth center or hospital? Once you are in labor and have been admitted into the hospital or birth center, your health care provider may:  Review your pregnancy history and any concerns that you have.  Insert an IV into one of your veins. This may be used to give you fluids and medicines.  Check your blood pressure, pulse, temperature, and heart rate (vital signs).  Check whether your bag of water (amniotic sac) has broken (ruptured).  Talk with you about your birth plan and discuss pain control options. Monitoring Your health care provider may monitor your contractions (uterine monitoring) and your baby's heart rate (fetal monitoring). You may need to be monitored:  Often, but not continuously (intermittently).  All the time or for long periods at a time (continuously). Continuous monitoring may be needed if: ? You are taking certain medicines, such as medicine to relieve pain or make your contractions stronger. ? You have pregnancy or labor complications. Monitoring may be done by:  Placing a special stethoscope or a handheld monitoring device on your abdomen to check your baby's heartbeat and to check for contractions.  Placing monitors on your abdomen (external monitors) to record your baby's heartbeat and the frequency and length of contractions.  Placing monitors inside your uterus through your vagina (internal monitors) to record your baby's heartbeat and the frequency, length, and strength of your contractions. Depending on the type of monitor, it may remain in your uterus or on your baby's head until  birth.  Telemetry. This is a type of continuous monitoring that can be done with external or internal monitors. Instead of having to stay in bed, you are able to move around during telemetry. Physical exam Your health care provider may perform frequent physical exams. This may include:  Checking how and where your baby is positioned in your uterus.  Checking your cervix to determine: ? Whether it is thinning out (effacing). ? Whether it is opening up (dilating). What happens during labor and delivery?  Normal labor and delivery is divided into the following three stages: Stage 1  This is the longest stage of labor.  This stage can last for hours or days.  Throughout this stage, you will feel contractions. Contractions generally feel mild, infrequent, and irregular at first. They get stronger, more frequent (about every 2-3 minutes), and more regular as you move through this stage.  This stage ends when your cervix is completely dilated to 4 inches (10 cm) and completely effaced. Stage 2  This stage starts once your cervix is completely effaced and dilated and lasts until the delivery of your baby.  This stage may last from 20 minutes to 2 hours.  This is the stage where you will feel an urge to push your baby out of your vagina.  You may feel stretching and burning pain, especially when the widest part of your baby's head passes through the vaginal opening (crowning).  Once your baby is delivered, the umbilical cord will be clamped and cut. This usually occurs after waiting a period of 1-2 minutes after delivery.  Your baby will be placed on your bare chest (  skin-to-skin contact) in an upright position and covered with a warm blanket. Watch your baby for feeding cues, like rooting or sucking, and help the baby to your breast for his or her first feeding. Stage 3  This stage starts immediately after the birth of your baby and ends after you deliver the placenta.  This stage may  take anywhere from 5 to 30 minutes.  After your baby has been delivered, you will feel contractions as your body expels the placenta and your uterus contracts to control bleeding. What can I expect after labor and delivery?  After labor is over, you and your baby will be monitored closely until you are ready to go home to ensure that you are both healthy. Your health care team will teach you how to care for yourself and your baby.  You and your baby will stay in the same room (rooming in) during your hospital stay. This will encourage early bonding and successful breastfeeding.  You may continue to receive fluids and medicines through an IV.  Your uterus will be checked and massaged regularly (fundal massage).  You will have some soreness and pain in your abdomen, vagina, and the area of skin between your vaginal opening and your anus (perineum).  If an incision was made near your vagina (episiotomy) or if you had some vaginal tearing during delivery, cold compresses may be placed on your episiotomy or your tear. This helps to reduce pain and swelling.  You may be given a squirt bottle to use instead of wiping when you go to the bathroom. To use the squirt bottle, follow these steps: ? Before you urinate, fill the squirt bottle with warm water. Do not use hot water. ? After you urinate, while you are sitting on the toilet, use the squirt bottle to rinse the area around your urethra and vaginal opening. This rinses away any urine and blood. ? Fill the squirt bottle with clean water every time you use the bathroom.  It is normal to have vaginal bleeding after delivery. Wear a sanitary pad for vaginal bleeding and discharge. Summary  Vaginal delivery means that you will give birth by pushing your baby out of your birth canal (vagina).  Your health care provider may monitor your contractions (uterine monitoring) and your baby's heart rate (fetal monitoring).  Your health care provider may  perform a physical exam.  Normal labor and delivery is divided into three stages.  After labor is over, you and your baby will be monitored closely until you are ready to go home. This information is not intended to replace advice given to you by your health care provider. Make sure you discuss any questions you have with your health care provider. Document Revised: 06/08/2017 Document Reviewed: 06/08/2017 Elsevier Patient Education  2020 Elsevier Inc.    Fetal Movement Counts Patient Name: ________________________________________________ Patient Due Date: ____________________ What is a fetal movement count?  A fetal movement count is the number of times that you feel your baby move during a certain amount of time. This may also be called a fetal kick count. A fetal movement count is recommended for every pregnant woman. You may be asked to start counting fetal movements as early as week 28 of your pregnancy. Pay attention to when your baby is most active. You may notice your baby's sleep and wake cycles. You may also notice things that make your baby move more. You should do a fetal movement count:  When your baby is normally most   active.  At the same time each day. A good time to count movements is while you are resting, after having something to eat and drink. How do I count fetal movements? 1. Find a quiet, comfortable area. Sit, or lie down on your side. 2. Write down the date, the start time and stop time, and the number of movements that you felt between those two times. Take this information with you to your health care visits. 3. Write down your start time when you feel the first movement. 4. Count kicks, flutters, swishes, rolls, and jabs. You should feel at least 10 movements. 5. You may stop counting after you have felt 10 movements, or if you have been counting for 2 hours. Write down the stop time. 6. If you do not feel 10 movements in 2 hours, contact your health care  provider for further instructions. Your health care provider may want to do additional tests to assess your baby's well-being. Contact a health care provider if:  You feel fewer than 10 movements in 2 hours.  Your baby is not moving like he or she usually does. Date: ____________ Start time: ____________ Stop time: ____________ Movements: ____________ Date: ____________ Start time: ____________ Stop time: ____________ Movements: ____________ Date: ____________ Start time: ____________ Stop time: ____________ Movements: ____________ Date: ____________ Start time: ____________ Stop time: ____________ Movements: ____________ Date: ____________ Start time: ____________ Stop time: ____________ Movements: ____________ Date: ____________ Start time: ____________ Stop time: ____________ Movements: ____________ Date: ____________ Start time: ____________ Stop time: ____________ Movements: ____________ Date: ____________ Start time: ____________ Stop time: ____________ Movements: ____________ Date: ____________ Start time: ____________ Stop time: ____________ Movements: ____________ This information is not intended to replace advice given to you by your health care provider. Make sure you discuss any questions you have with your health care provider. Document Revised: 12/22/2018 Document Reviewed: 12/22/2018 Elsevier Patient Education  2020 Elsevier Inc.  

## 2019-10-27 NOTE — Progress Notes (Signed)
Danielle Wood-Doing well except for irregular contractions nightly, still working 24 hour shift. Taking Celexa daily, GAD-7: 0. Request SVE and membrane sweep. Anticipatory guidance regarding course of prenatal care. Reviewed red flag symptoms and when to call. RTC x 1 week for Danielle Wood or sooner if needed.

## 2019-11-03 ENCOUNTER — Ambulatory Visit (INDEPENDENT_AMBULATORY_CARE_PROVIDER_SITE_OTHER): Payer: BC Managed Care – PPO | Admitting: Certified Nurse Midwife

## 2019-11-03 ENCOUNTER — Other Ambulatory Visit: Payer: Self-pay

## 2019-11-03 VITALS — BP 126/77 | HR 88 | Wt 199.0 lb

## 2019-11-03 DIAGNOSIS — Z3A38 38 weeks gestation of pregnancy: Secondary | ICD-10-CM

## 2019-11-03 LAB — POCT URINALYSIS DIPSTICK OB
Bilirubin, UA: NEGATIVE
Blood, UA: NEGATIVE
Glucose, UA: NEGATIVE
Ketones, UA: NEGATIVE
Leukocytes, UA: NEGATIVE
Nitrite, UA: NEGATIVE
POC,PROTEIN,UA: NEGATIVE
Spec Grav, UA: 1.01 (ref 1.010–1.025)
Urobilinogen, UA: 0.2 E.U./dL
pH, UA: 5 (ref 5.0–8.0)

## 2019-11-03 NOTE — Patient Instructions (Signed)
Fetal Movement Counts Patient Name: ________________________________________________ Patient Due Date: ____________________ What is a fetal movement count?  A fetal movement count is the number of times that you feel your baby move during a certain amount of time. This may also be called a fetal kick count. A fetal movement count is recommended for every pregnant woman. You may be asked to start counting fetal movements as early as week 28 of your pregnancy. Pay attention to when your baby is most active. You may notice your baby's sleep and wake cycles. You may also notice things that make your baby move more. You should do a fetal movement count:  When your baby is normally most active.  At the same time each day. A good time to count movements is while you are resting, after having something to eat and drink. How do I count fetal movements? 1. Find a quiet, comfortable area. Sit, or lie down on your side. 2. Write down the date, the start time and stop time, and the number of movements that you felt between those two times. Take this information with you to your health care visits. 3. Write down your start time when you feel the first movement. 4. Count kicks, flutters, swishes, rolls, and jabs. You should feel at least 10 movements. 5. You may stop counting after you have felt 10 movements, or if you have been counting for 2 hours. Write down the stop time. 6. If you do not feel 10 movements in 2 hours, contact your health care provider for further instructions. Your health care provider may want to do additional tests to assess your baby's well-being. Contact a health care provider if:  You feel fewer than 10 movements in 2 hours.  Your baby is not moving like he or she usually does. Date: ____________ Start time: ____________ Stop time: ____________ Movements: ____________ Date: ____________ Start time: ____________ Stop time: ____________ Movements: ____________ Date: ____________  Start time: ____________ Stop time: ____________ Movements: ____________ Date: ____________ Start time: ____________ Stop time: ____________ Movements: ____________ Date: ____________ Start time: ____________ Stop time: ____________ Movements: ____________ Date: ____________ Start time: ____________ Stop time: ____________ Movements: ____________ Date: ____________ Start time: ____________ Stop time: ____________ Movements: ____________ Date: ____________ Start time: ____________ Stop time: ____________ Movements: ____________ Date: ____________ Start time: ____________ Stop time: ____________ Movements: ____________ This information is not intended to replace advice given to you by your health care provider. Make sure you discuss any questions you have with your health care provider. Document Revised: 12/22/2018 Document Reviewed: 12/22/2018 Elsevier Patient Education  2020 [employer]  

## 2019-11-03 NOTE — Progress Notes (Signed)
ROB-Reports insomnia and irregular contractions with increased pelvic pressure and worsening hemorrhoids. Discussed home labor preparation techniques. Request SVE and membrane sweeping. Anticipatory guidance regarding course of prenatal care. Reviewed red flag symptoms and when to call. RTC x 1 week for ROB or sooner if needed.

## 2019-11-07 ENCOUNTER — Inpatient Hospital Stay: Payer: BC Managed Care – PPO | Admitting: Anesthesiology

## 2019-11-07 ENCOUNTER — Other Ambulatory Visit: Payer: Self-pay

## 2019-11-07 ENCOUNTER — Inpatient Hospital Stay
Admission: AD | Admit: 2019-11-07 | Discharge: 2019-11-08 | DRG: 807 | Disposition: A | Discharge status: Home / Self Care | Payer: BC Managed Care – PPO | Attending: Certified Nurse Midwife | Admitting: Certified Nurse Midwife

## 2019-11-07 ENCOUNTER — Encounter: Payer: Self-pay | Admitting: Obstetrics and Gynecology

## 2019-11-07 DIAGNOSIS — Z6791 Unspecified blood type, Rh negative: Secondary | ICD-10-CM

## 2019-11-07 DIAGNOSIS — F410 Panic disorder [episodic paroxysmal anxiety] without agoraphobia: Secondary | ICD-10-CM | POA: Diagnosis present

## 2019-11-07 DIAGNOSIS — Z3A39 39 weeks gestation of pregnancy: Secondary | ICD-10-CM | POA: Diagnosis not present

## 2019-11-07 DIAGNOSIS — Z20822 Contact with and (suspected) exposure to covid-19: Secondary | ICD-10-CM | POA: Diagnosis present

## 2019-11-07 DIAGNOSIS — O99344 Other mental disorders complicating childbirth: Secondary | ICD-10-CM | POA: Diagnosis present

## 2019-11-07 DIAGNOSIS — O26893 Other specified pregnancy related conditions, third trimester: Secondary | ICD-10-CM | POA: Diagnosis present

## 2019-11-07 DIAGNOSIS — Z3490 Encounter for supervision of normal pregnancy, unspecified, unspecified trimester: Secondary | ICD-10-CM | POA: Diagnosis not present

## 2019-11-07 LAB — TYPE AND SCREEN
ABO/RH(D): O NEG
Antibody Screen: NEGATIVE

## 2019-11-07 LAB — CBC
HCT: 28 % — ABNORMAL LOW (ref 36.0–46.0)
Hemoglobin: 10 g/dL — ABNORMAL LOW (ref 12.0–15.0)
MCH: 32.2 pg (ref 26.0–34.0)
MCHC: 35.7 g/dL (ref 30.0–36.0)
MCV: 90 fL (ref 80.0–100.0)
Platelets: 204 10*3/uL (ref 150–400)
RBC: 3.11 MIL/uL — ABNORMAL LOW (ref 3.87–5.11)
RDW: 12.9 % (ref 11.5–15.5)
WBC: 8.9 10*3/uL (ref 4.0–10.5)
nRBC: 0 % (ref 0.0–0.2)

## 2019-11-07 LAB — ABO/RH: ABO/RH(D): O NEG

## 2019-11-07 LAB — SARS CORONAVIRUS 2 BY RT PCR (HOSPITAL ORDER, PERFORMED IN ~~LOC~~ HOSPITAL LAB): SARS Coronavirus 2: NEGATIVE

## 2019-11-07 MED ORDER — PRENATAL MULTIVITAMIN CH
1.0000 | ORAL_TABLET | Freq: Every day | ORAL | Status: DC
  Administered 2019-11-08: 1 via ORAL
  Filled 2019-11-07: qty 1

## 2019-11-07 MED ORDER — ONDANSETRON HCL 4 MG/2ML IJ SOLN: 4.0000 mg | INTRAMUSCULAR | Status: DC | PRN

## 2019-11-07 MED ORDER — FERROUS SULFATE 325 (65 FE) MG PO TABS
325.0000 mg | ORAL_TABLET | Freq: Every day | ORAL | Status: DC
  Administered 2019-11-08: 325 mg via ORAL
  Filled 2019-11-07: qty 1

## 2019-11-07 MED ORDER — LIDOCAINE HCL (PF) 1 % IJ SOLN
INTRAMUSCULAR | Status: DC | PRN
  Administered 2019-11-07: 3 mL via SUBCUTANEOUS

## 2019-11-07 MED ORDER — EPHEDRINE 5 MG/ML INJ
10.0000 mg | INTRAVENOUS | Status: DC | PRN
  Filled 2019-11-07: qty 2

## 2019-11-07 MED ORDER — FENTANYL 2.5 MCG/ML W/ROPIVACAINE 0.15% IN NS 100 ML EPIDURAL (ARMC)
12.0000 mL/h | EPIDURAL | Status: DC
  Administered 2019-11-07: 12 mL/h via EPIDURAL

## 2019-11-07 MED ORDER — DOCUSATE SODIUM 100 MG PO CAPS
100.0000 mg | ORAL_CAPSULE | Freq: Two times a day (BID) | ORAL | Status: DC
  Administered 2019-11-07 – 2019-11-08 (×2): 100 mg via ORAL
  Filled 2019-11-07 (×2): qty 1

## 2019-11-07 MED ORDER — PHENYLEPHRINE 40 MCG/ML (10ML) SYRINGE FOR IV PUSH (FOR BLOOD PRESSURE SUPPORT)
80.0000 ug | PREFILLED_SYRINGE | INTRAVENOUS | Status: DC | PRN
  Filled 2019-11-07: qty 10

## 2019-11-07 MED ORDER — OXYTOCIN-SODIUM CHLORIDE 30-0.9 UT/500ML-% IV SOLN: 2.5000 [IU]/h | INTRAVENOUS | Status: DC

## 2019-11-07 MED ORDER — FENTANYL 2.5 MCG/ML W/ROPIVACAINE 0.15% IN NS 100 ML EPIDURAL (ARMC)
EPIDURAL | Status: AC
  Filled 2019-11-07: qty 100

## 2019-11-07 MED ORDER — WITCH HAZEL-GLYCERIN EX PADS: 1.0000 "application " | MEDICATED_PAD | CUTANEOUS | Status: DC | PRN

## 2019-11-07 MED ORDER — CITALOPRAM HYDROBROMIDE 10 MG PO TABS
10.0000 mg | ORAL_TABLET | Freq: Every day | ORAL | Status: DC
  Filled 2019-11-07 (×2): qty 1

## 2019-11-07 MED ORDER — SIMETHICONE 80 MG PO CHEW: 80.0000 mg | CHEWABLE_TABLET | ORAL | Status: DC | PRN

## 2019-11-07 MED ORDER — LACTATED RINGERS IV SOLN: INTRAVENOUS | Status: DC

## 2019-11-07 MED ORDER — IBUPROFEN 600 MG PO TABS
600.0000 mg | ORAL_TABLET | Freq: Four times a day (QID) | ORAL | Status: DC
  Administered 2019-11-07 – 2019-11-08 (×4): 600 mg via ORAL
  Filled 2019-11-07 (×4): qty 1

## 2019-11-07 MED ORDER — ACETAMINOPHEN 325 MG PO TABS: 650.0000 mg | ORAL_TABLET | ORAL | Status: DC | PRN

## 2019-11-07 MED ORDER — OXYTOCIN-SODIUM CHLORIDE 30-0.9 UT/500ML-% IV SOLN
1.0000 m[IU]/min | INTRAVENOUS | Status: DC
  Administered 2019-11-07: 4 m[IU]/min via INTRAVENOUS
  Filled 2019-11-07: qty 500

## 2019-11-07 MED ORDER — COCONUT OIL OIL
1.0000 "application " | TOPICAL_OIL | Status: DC | PRN
  Administered 2019-11-08: 1 via TOPICAL
  Filled 2019-11-07: qty 120

## 2019-11-07 MED ORDER — BUPIVACAINE HCL (PF) 0.25 % IJ SOLN
INTRAMUSCULAR | Status: DC | PRN
  Administered 2019-11-07: 5 mL via EPIDURAL
  Administered 2019-11-07: 3 mL via EPIDURAL

## 2019-11-07 MED ORDER — METHYLERGONOVINE MALEATE 0.2 MG PO TABS: 0.2000 mg | ORAL_TABLET | ORAL | Status: DC | PRN

## 2019-11-07 MED ORDER — OXYCODONE-ACETAMINOPHEN 5-325 MG PO TABS: 1.0000 | ORAL_TABLET | ORAL | Status: DC | PRN

## 2019-11-07 MED ORDER — BENZOCAINE-MENTHOL 20-0.5 % EX AERO
1.0000 "application " | INHALATION_SPRAY | CUTANEOUS | Status: DC | PRN
  Administered 2019-11-08: 1 via TOPICAL
  Filled 2019-11-07 (×2): qty 56

## 2019-11-07 MED ORDER — LIDOCAINE HCL (PF) 1 % IJ SOLN: 30.0000 mL | INTRAMUSCULAR | Status: DC | PRN

## 2019-11-07 MED ORDER — LACTATED RINGERS IV SOLN
500.0000 mL | INTRAVENOUS | Status: DC | PRN
  Administered 2019-11-07: 500 mL via INTRAVENOUS

## 2019-11-07 MED ORDER — LIDOCAINE-EPINEPHRINE (PF) 1.5 %-1:200000 IJ SOLN
INTRAMUSCULAR | Status: DC | PRN
  Administered 2019-11-07: 4 mL via EPIDURAL

## 2019-11-07 MED ORDER — METHYLERGONOVINE MALEATE 0.2 MG/ML IJ SOLN: 0.2000 mg | INTRAMUSCULAR | Status: DC | PRN

## 2019-11-07 MED ORDER — ACETAMINOPHEN 325 MG PO TABS
650.0000 mg | ORAL_TABLET | ORAL | Status: DC | PRN
  Administered 2019-11-08: 650 mg via ORAL
  Filled 2019-11-07: qty 2

## 2019-11-07 MED ORDER — ONDANSETRON HCL 4 MG PO TABS: 4.0000 mg | ORAL_TABLET | ORAL | Status: DC | PRN

## 2019-11-07 MED ORDER — OXYCODONE-ACETAMINOPHEN 5-325 MG PO TABS: 2.0000 | ORAL_TABLET | ORAL | Status: DC | PRN

## 2019-11-07 MED ORDER — SENNOSIDES-DOCUSATE SODIUM 8.6-50 MG PO TABS: 2.0000 | ORAL_TABLET | ORAL | Status: DC

## 2019-11-07 MED ORDER — TERBUTALINE SULFATE 1 MG/ML IJ SOLN: 0.2500 mg | Freq: Once | INTRAMUSCULAR | Status: DC | PRN

## 2019-11-07 MED ORDER — LACTATED RINGERS IV SOLN
500.0000 mL | Freq: Once | INTRAVENOUS | Status: AC
  Administered 2019-11-07: 500 mL via INTRAVENOUS

## 2019-11-07 MED ORDER — OXYTOCIN BOLUS FROM INFUSION
333.0000 mL | Freq: Once | INTRAVENOUS | Status: AC
  Administered 2019-11-07: 333 mL via INTRAVENOUS

## 2019-11-07 MED ORDER — BUTORPHANOL TARTRATE 1 MG/ML IJ SOLN: 1.0000 mg | INTRAMUSCULAR | Status: DC | PRN

## 2019-11-07 MED ORDER — DIBUCAINE (PERIANAL) 1 % EX OINT: 1.0000 "application " | TOPICAL_OINTMENT | CUTANEOUS | Status: DC | PRN

## 2019-11-07 MED ORDER — SOD CITRATE-CITRIC ACID 500-334 MG/5ML PO SOLN: 30.0000 mL | ORAL | Status: DC | PRN

## 2019-11-07 MED ORDER — ONDANSETRON HCL 4 MG/2ML IJ SOLN
4.0000 mg | Freq: Four times a day (QID) | INTRAMUSCULAR | Status: DC | PRN
  Administered 2019-11-07: 4 mg via INTRAVENOUS
  Filled 2019-11-07: qty 2

## 2019-11-07 MED ORDER — DIPHENHYDRAMINE HCL 50 MG/ML IJ SOLN: 12.5000 mg | INTRAMUSCULAR | Status: DC | PRN

## 2019-11-07 NOTE — Anesthesia Procedure Notes (Signed)
Epidural Patient location during procedure: OB Start time: 11/07/2019 11:10 AM End time: 11/07/2019 11:30 AM  Staffing Anesthesiologist: Alver Fisher, MD Performed: anesthesiologist   Preanesthetic Checklist Completed: patient identified, IV checked, site marked, risks and benefits discussed, surgical consent, monitors and equipment checked, pre-op evaluation and timeout performed  Epidural Patient position: sitting Prep: ChloraPrep Patient monitoring: heart rate, continuous pulse ox and blood pressure Approach: midline Location: L3-L4 Injection technique: LOR saline  Needle:  Needle type: Tuohy  Needle gauge: 18 G Needle length: 9 cm and 9 Needle insertion depth: 5 cm Catheter type: closed end flexible Catheter size: 20 Guage Catheter at skin depth: 9 cm Test dose: negative (0.125% bupivacaine)  Assessment Events: blood not aspirated, injection not painful, no injection resistance, no paresthesia and negative IV test  Additional Notes   Patient tolerated the insertion well without complications.Reason for block:procedure for pain

## 2019-11-07 NOTE — Anesthesia Procedure Notes (Signed)
Epidural  Start time: 11/07/2019 10:00 AM End time: 11/07/2019 10:07 AM  Staffing Anesthesiologist: Alver Fisher, MD Resident/CRNA: Irving Burton, CRNA Performed: resident/CRNA   Preanesthetic Checklist Completed: patient identified, IV checked, site marked, risks and benefits discussed, surgical consent, monitors and equipment checked and pre-op evaluation  Epidural Patient position: sitting Prep: ChloraPrep and site prepped and draped Patient monitoring: heart rate, continuous pulse ox and blood pressure Approach: midline Location: L3-L4 Injection technique: LOR air  Needle:  Needle type: Tuohy  Needle gauge: 17 G Needle length: 9 cm Needle insertion depth: 7 cm Catheter type: closed end flexible Catheter size: 19 Gauge Catheter at skin depth: 11 cm Test dose: negative and 1.5% lidocaine with Epi 1:200 K  Assessment Events: blood not aspirated, injection not painful, no injection resistance, no paresthesia and negative IV test  Additional Notes Reason for block:procedure for pain

## 2019-11-07 NOTE — H&P (Signed)
History and Physical   HPI  Danielle Wood is a 29 y.o. G2P1001 at [redacted]w[redacted]d Estimated Date of Delivery: 11/11/19 who is being admitted for labor management.    OB History  OB History  Gravida Para Term Preterm AB Living  2 1 1  0 0 1  SAB TAB Ectopic Multiple Live Births  0 0 0 0 1    # Outcome Date GA Lbr Len/2nd Weight Sex Delivery Anes PTL Lv  2 Current           1 Term 10/03/15 [redacted]w[redacted]d  3030 g F Vag-Spont EPI N LIV     Name: [name]     Apgar1: 9  Apgar5: 9    PROBLEM LIST  Pregnancy complications or risks: Patient Active Problem List   Diagnosis Date Noted  . Term pregnancy 11/07/2019  . Labor and delivery, indication for care 11/07/2019  . Low-lying placenta 07/27/2019  . History of placenta abruption 07/27/2019  . Panic attacks 07/27/2019  . Rh negative state in antepartum period 07/27/2019  . Family history of chromosomal abnormality 03/28/2015    Prenatal labs and studies: ABO, Rh: O/Negative/-- (11/30 1529) Antibody: Negative (11/30 1529) Rubella: 7.23 (11/30 1529) RPR: Non Reactive (04/07 1110)  HBsAg: Negative (11/30 1529)  HIV: Non Reactive (11/30 1529)  DXA:JOINOMVE/-- (06/04 1108)   Past Medical History:  Diagnosis Date  . Anxiety      Past Surgical History:  Procedure Laterality Date  . TONSILLECTOMY AND ADENOIDECTOMY N/A 01/14/2016   Procedure: TONSILLECTOMY AND ADENOIDECTOMY;  Surgeon: Beverly Gust, MD;  Location: [facility] ORS;  Service: ENT;  Laterality: N/A;  . WISDOM TOOTH EXTRACTION  2010     Medications    Current Discharge Medication List    CONTINUE these medications which have NOT CHANGED   Details  ALPRAZolam (XANAX) 0.5 MG tablet Take 0.5 mg by mouth 2 (two) times daily as needed for anxiety.    citalopram (CELEXA) 10 MG tablet Take 1 tablet (10 mg total) by mouth daily. Qty: 30 tablet, Refills: 1    ondansetron (ZOFRAN) 4 MG tablet Take 1 tablet (4 mg total) by mouth every 8 (eight) hours as needed for nausea or  vomiting. Qty: 20 tablet, Refills: 1    Prenatal Vit-Fe Fumarate-FA (PRENATAL MULTIVITAMIN) TABS tablet Take 1 tablet by mouth daily at 12 noon.         Allergies  Sulfamethoxazole-trimethoprim  Review of Systems  Constitutional: negative Eyes: negative Ears, nose, mouth, throat, and face: negative Respiratory: negative Cardiovascular: negative Gastrointestinal: negative Genitourinary:negative Integument/breast: negative Hematologic/lymphatic: negative Musculoskeletal:negative Neurological: negative Behavioral/Psych: negative Endocrine: negative Allergic/Immunologic: negative  Physical Exam  BP 126/85   Pulse 76   Temp 98.2 F (36.8 C) (Oral)   Resp 16   Ht 5\' 9"  (1.753 m)   Wt 90.3 kg   LMP 02/04/2019 (Exact Date)   BMI 29.39 kg/m   Lungs:  CTA B Cardio: RRR  Abd: Soft, gravid, NT Presentation: cephalic EXT: No C/C/ 1+ Edema DTRs: 2+ B CERVIX: Dilation: 5 Effacement (%): 70 Cervical Position: Posterior Station: -2 Presentation: Vertex Exam by:: Grandville Silos, CNM  See Prenatal records for more detailed PE.     FHR:  Baseline: 120 bpm, Variability: Good {> 6 bpm), Accelerations: Reactive and Decelerations: Absent  Toco: Uterine Contractions: Frequency: Every 3-5 minutes, Duration: 40-60 seconds and Intensity: mild to moderate  Test Results  Results for orders placed or performed during the hospital encounter of 11/07/19 (from the past 24 hour(s))  SARS Coronavirus 2 by RT PCR (hospital order, performed in [facility] hospital lab) Nasopharyngeal Nasopharyngeal Swab     Status: None   Collection Time: 11/07/19  6:31 AM   Specimen: Nasopharyngeal Swab  Result Value Ref Range   SARS Coronavirus 2 NEGATIVE NEGATIVE   Group B Strep negative  Assessment   G2P1001 at [redacted]w[redacted]d Estimated Date of Delivery: 11/11/19  The fetus is reassuring.   Patient Active Problem List   Diagnosis Date Noted  . Term pregnancy 11/07/2019  . Labor and delivery,  indication for care 11/07/2019  . Low-lying placenta 07/27/2019  . History of placenta abruption 07/27/2019  . Panic attacks 07/27/2019  . Rh negative state in antepartum period 07/27/2019  . Family history of chromosomal abnormality 03/28/2015    Plan  1. Admit to L&D :   2. EFM:-- Category 1 3. Stadol or Epidural if desired.   4. Admission labs completed 5. AROM-clear 6. Dr. Logan Bores informed of admission and plan of care 7. Anticipate NSVD  Doreene Burke, CNM  11/07/2019 8:02 AM

## 2019-11-07 NOTE — Anesthesia Preprocedure Evaluation (Signed)
Anesthesia Evaluation  Patient identified by MRN, date of birth, ID band Patient awake    Reviewed: Allergy & Precautions, H&P , NPO status , Patient's Chart, lab work & pertinent test results  History of Anesthesia Complications Negative for: history of anesthetic complications  Airway Mallampati: II       Dental   Pulmonary neg pulmonary ROS,           Cardiovascular negative cardio ROS       Neuro/Psych Anxiety negative neurological ROS     GI/Hepatic negative GI ROS, Neg liver ROS,   Endo/Other  negative endocrine ROS  Renal/GU negative Renal ROS     Musculoskeletal negative musculoskeletal ROS (+)   Abdominal   Peds  Hematology negative hematology ROS (+)   Anesthesia Other Findings   Reproductive/Obstetrics (+) Pregnancy                             Anesthesia Physical  Anesthesia Plan  ASA: II  Anesthesia Plan: Epidural   Post-op Pain Management:    Induction: Intravenous  PONV Risk Score and Plan:   Airway Management Planned: Oral ETT  Additional Equipment:   Intra-op Plan:   Post-operative Plan:   Informed Consent: I have reviewed the patients History and Physical, chart, labs and discussed the procedure including the risks, benefits and alternatives for the proposed anesthesia with the patient or authorized representative who has indicated his/her understanding and acceptance.       Plan Discussed with: Anesthesiologist  Anesthesia Plan Comments:         Anesthesia Quick Evaluation

## 2019-11-07 NOTE — OB Triage Note (Signed)
Pt is G2P1 at 39.3 week with UCx2 hours Every 4 min. Pt was seen in the office on Friday and was 4 cm. Pt denies leaking of fluid, bleeding and feels fetal movement. No acute distress

## 2019-11-08 ENCOUNTER — Encounter: Payer: BC Managed Care – PPO | Admitting: Certified Nurse Midwife

## 2019-11-08 ENCOUNTER — Encounter: Payer: Self-pay | Admitting: Certified Nurse Midwife

## 2019-11-08 LAB — CBC
HCT: 27.4 % — ABNORMAL LOW (ref 36.0–46.0)
Hemoglobin: 9.4 g/dL — ABNORMAL LOW (ref 12.0–15.0)
MCH: 31.3 pg (ref 26.0–34.0)
MCHC: 34.3 g/dL (ref 30.0–36.0)
MCV: 91.3 fL (ref 80.0–100.0)
Platelets: 170 10*3/uL (ref 150–400)
RBC: 3 MIL/uL — ABNORMAL LOW (ref 3.87–5.11)
RDW: 12.8 % (ref 11.5–15.5)
WBC: 9 10*3/uL (ref 4.0–10.5)
nRBC: 0 % (ref 0.0–0.2)

## 2019-11-08 LAB — FETAL SCREEN: Fetal Screen: NEGATIVE

## 2019-11-08 LAB — RPR: RPR Ser Ql: NONREACTIVE

## 2019-11-08 MED ORDER — RHO D IMMUNE GLOBULIN 1500 UNIT/2ML IJ SOSY
300.0000 ug | PREFILLED_SYRINGE | Freq: Once | INTRAMUSCULAR | Status: AC
  Administered 2019-11-08: 300 ug via INTRAVENOUS
  Filled 2019-11-08: qty 2

## 2019-11-08 MED ORDER — IBUPROFEN 600 MG PO TABS: 600.0000 mg | ORAL_TABLET | Freq: Four times a day (QID) | ORAL | 0 refills | Status: DC

## 2019-11-08 NOTE — Discharge Instructions (Signed)

## 2019-11-08 NOTE — Anesthesia Postprocedure Evaluation (Signed)
Anesthesia Post Note  Patient: Danielle Wood  Procedure(s) Performed: AN AD HOC LABOR EPIDURAL  Patient location during evaluation: Mother Baby Anesthesia Type: Epidural Level of consciousness: awake and alert Pain management: pain level controlled Vital Signs Assessment: post-procedure vital signs reviewed and stable Respiratory status: spontaneous breathing, nonlabored ventilation and respiratory function stable Cardiovascular status: stable Postop Assessment: no headache, no backache and epidural receding Anesthetic complications: no   No complications documented.   Last Vitals:  Vitals:   11/08/19 0449 11/08/19 0809  BP: 129/87 117/82  Pulse: 63 65  Resp: 18 20  Temp: 36.8 C 36.7 C  SpO2: 100% 99%    Last Pain:  Vitals:   11/08/19 0830  TempSrc:   PainSc: 3                  [name],  Alessandra Bevels

## 2019-11-08 NOTE — Progress Notes (Signed)
Discharge order received from doctor. Reviewed discharge instructions and prescriptions with patient and answered all questions. Follow up appointment instructions given. Patient verbalized understanding. ID bands checked. Patient discharged home with infant via wheelchair by nursing/auxillary.    Vint Pola, RN  

## 2019-11-08 NOTE — Final Progress Note (Signed)
Discharge Day SOAP Note:  Progress Note - Vaginal Delivery  Danielle Wood is a 29 y.o. G2P1001 now PP day 1 s/p Vaginal, Spontaneous . Delivery was uncomplicated  Subjective  The patient has the following complaints: has no unusual complaints  Pain is controlled with current medications.   Patient is urinating without difficulty.  She is ambulating well.     Objective  Vital signs: BP 129/87 (BP Location: Right Arm)   Pulse 63   Temp 98.3 F (36.8 C) (Oral)   Resp 18   Ht 5\' 9"  (1.753 m)   Wt 90.3 kg   LMP 02/04/2019 (Exact Date)   SpO2 100%   BMI 29.39 kg/m   Physical Exam: Gen: NAD Fundus Fundal Tone: Firm@ u-1  Lochia Amount: Small        Data Review Labs: Lab Results  Component Value Date   WBC 8.9 11/07/2019   HGB 10.0 (L) 11/07/2019   HCT 28.0 (L) 11/07/2019   MCV 90.0 11/07/2019   PLT 204 11/07/2019   CBC Latest Ref Rng & Units 11/07/2019 08/23/2019 07/20/2017  WBC 4.0 - 10.5 K/uL 8.9 7.4 6.1  Hemoglobin 12.0 - 15.0 g/dL 10.0(L) 11.8 13.9  Hematocrit 36 - 46 % 28.0(L) 33.8(L) 40.9  Platelets 150 - 400 K/uL 204 203 255   O NEG Performed at [facility], [address] [place], [place]   [address] Score: Inocente Salles Postnatal Depression Scale Screening Tool 11/07/2019  I have been able to laugh and see the funny side of things. (No Data)    Assessment/Plan  Active Problems:   Term pregnancy   Labor and delivery, indication for care    Plan for discharge today.  Discharge Instructions: Per After Visit Summary. Activity: Advance as tolerated. Pelvic rest for 6 weeks.  Also refer to After Visit Summary Diet: Regular Medications: Allergies as of 11/08/2019      Reactions   Sulfamethoxazole-trimethoprim Other (See Comments)   Fever/chills/flu like symptoms      Medication List    STOP taking these medications   ondansetron 4 MG tablet Commonly known as: Zofran     TAKE these medications   ALPRAZolam 0.5 MG  tablet Commonly known as: XANAX Take 0.5 mg by mouth 2 (two) times daily as needed for anxiety.   citalopram 10 MG tablet Commonly known as: CeleXA Take 1 tablet (10 mg total) by mouth daily. What changed: how much to take   ibuprofen 600 MG tablet Commonly known as: ADVIL Take 1 tablet (600 mg total) by mouth every 6 (six) hours.   prenatal multivitamin Tabs tablet Take 1 tablet by mouth daily at 12 noon.      Outpatient follow up: 2 wk tele visit , 6 wk ppv with 11/10/2019, CNM  Postpartum contraception: Will discuss at first office visit post-partum  Discharged Condition: good  Discharged to: home  Newborn Data: Disposition:home with mother  Apgars: APGAR (1 MIN): 8   APGAR (5 MINS): 9   APGAR (10 MINS):    Baby Feeding: Breast    Doreene Burke, CNM  11/08/2019 4:56 AM

## 2019-11-08 NOTE — Discharge Summary (Signed)
Patient Name: Danielle Wood DOB: 04-28-1991 MRN: [id number]                            Discharge Summary  Date of Admission: 11/07/2019 Date of Discharge: 11/08/2019 Delivering Provider: Doreene Burke   Admitting Diagnosis: Term pregnancy [Z34.90] Labor and delivery, indication for care [O75.9] at [redacted]w[redacted]d Secondary diagnosis:  Active Problems:   Term pregnancy   Labor and delivery, indication for care  Mode of Delivery: normal spontaneous vaginal delivery              Discharge diagnosis: Term Pregnancy Delivered      Intrapartum Procedures: epidural and laceration 1st   Post partum procedures: rhogam  Complications: 1st degree perineal laceration                     Discharge Day SOAP Note:  Progress Note - Vaginal Delivery  Danielle Wood is a 29 y.o. G2P1001 now PP day 1 s/p Vaginal, Spontaneous . Delivery was uncomplicated  Subjective  The patient has the following complaints: has no unusual complaints  Pain is controlled with current medications.   Patient is urinating without difficulty.  She is ambulating well.     Objective  Vital signs: BP 129/87 (BP Location: Right Arm)   Pulse 63   Temp 98.3 F (36.8 C) (Oral)   Resp 18   Ht 5\' 9"  (1.753 m)   Wt 90.3 kg   LMP 02/04/2019 (Exact Date)   SpO2 100%   BMI 29.39 kg/m   Physical Exam: Gen: NAD Fundus Fundal Tone: Firm@ u-1  Lochia Amount: Small        Data Review Labs: Lab Results  Component Value Date   WBC 8.9 11/07/2019   HGB 10.0 (L) 11/07/2019   HCT 28.0 (L) 11/07/2019   MCV 90.0 11/07/2019   PLT 204 11/07/2019   CBC Latest Ref Rng & Units 11/07/2019 08/23/2019 07/20/2017  WBC 4.0 - 10.5 K/uL 8.9 7.4 6.1  Hemoglobin 12.0 - 15.0 g/dL 10.0(L) 11.8 13.9  Hematocrit 36 - 46 % 28.0(L) 33.8(L) 40.9  Platelets 150 - 400 K/uL 204 203 255   O NEG Performed at [facility], [address] [place], [place]   [address] Score: Inocente Salles Postnatal Depression  Scale Screening Tool 11/07/2019  I have been able to laugh and see the funny side of things. (No Data)    Assessment/Plan  Active Problems:   Term pregnancy   Labor and delivery, indication for care    Plan for discharge today.  Discharge Instructions: Per After Visit Summary. Activity: Advance as tolerated. Pelvic rest for 6 weeks.  Also refer to After Visit Summary Diet: Regular Medications: Allergies as of 11/08/2019      Reactions   Sulfamethoxazole-trimethoprim Other (See Comments)   Fever/chills/flu like symptoms      Medication List    STOP taking these medications   ondansetron 4 MG tablet Commonly known as: Zofran     TAKE these medications   ALPRAZolam 0.5 MG tablet Commonly known as: XANAX Take 0.5 mg by mouth 2 (two) times daily as needed for anxiety.   citalopram 10 MG tablet Commonly known as: CeleXA Take 1 tablet (10 mg total) by mouth daily. What changed: how much to take   ibuprofen 600 MG tablet Commonly known as: ADVIL Take 1 tablet (600 mg total) by mouth every 6 (six) hours.  prenatal multivitamin Tabs tablet Take 1 tablet by mouth daily at 12 noon.      Outpatient follow up: 2 wk tele visit , 6 wk ppv with Philip Aspen, CNM  Postpartum contraception: Will discuss at first office visit post-partum  Discharged Condition: good  Discharged to: home  Newborn Data: Disposition:home with mother  Apgars: APGAR (1 MIN): 8   APGAR (5 MINS): 9   APGAR (10 MINS):    Baby Feeding: Breast    Philip Aspen, CNM  11/08/2019 4:56 AM

## 2019-11-08 NOTE — Plan of Care (Signed)
Danielle Wood has already rounded on pt and tentatively discharging pt; (will need to wait until baby is 24 hours old); pt up ad lib; tolerating regular diet; taking motrin for pain control; giving baby either pumped milk or similac via bottle; if pt ends up having to stay due to the baby having to stay, call Danielle Wood to notify her

## 2019-11-08 NOTE — Lactation Note (Signed)
This note was copied from a baby's chart. Lactation Consultation Note  Patient Name: Boy Koa Palla NRWKE'T Date: 11/08/2019 Reason for consult: Initial assessment;Term  Initial LC visit with mom. Mom is G2P2, with hx of exclusive pump/bottle feeding of older child for 2 months before having "low supply". LC attempted to provide breastfeeding support, and education for pumping success, and hand expression- mom denies all education, or additional resources.  LC number is available on whiteboard, LC did encourage mom to call out today if questions do arise, or with any concerns.  Maternal Data Formula Feeding for Exclusion: No Has patient been taught Hand Expression?: Yes (declines review) Does the patient have breastfeeding experience prior to this delivery?: Yes  Feeding Feeding Type: Bottle Fed - Formula Nipple Type: Slow - flow  LATCH Score                   Interventions Interventions: Breast feeding basics reviewed;DEBP  Lactation Tools Discussed/Used Pump Review: Other (comment) (declined review)   Consult Status Consult Status: Complete    Danford Bad 11/08/2019, 10:52 AM

## 2019-11-09 LAB — RHOGAM INJECTION: Unit division: 0

## 2019-11-27 ENCOUNTER — Encounter: Payer: Self-pay | Admitting: Certified Nurse Midwife

## 2019-11-27 ENCOUNTER — Ambulatory Visit (INDEPENDENT_AMBULATORY_CARE_PROVIDER_SITE_OTHER): Payer: BC Managed Care – PPO | Admitting: Certified Nurse Midwife

## 2019-11-27 ENCOUNTER — Other Ambulatory Visit: Payer: Self-pay

## 2019-11-27 VITALS — BP 118/81 | HR 80 | Ht 69.0 in | Wt 180.0 lb

## 2019-11-27 DIAGNOSIS — Z1331 Encounter for screening for depression: Secondary | ICD-10-CM | POA: Diagnosis not present

## 2019-11-27 NOTE — Progress Notes (Signed)
Patient is pumping. Her vaginal flow is minimum. PhQ9=0

## 2019-11-27 NOTE — Progress Notes (Signed)
29 yr old 2 wk post partum mood check. She had SVD 39 wk 3 days with epidural. 1st degree lacerations. She state she is doing well., eating regularly, no problems going to the bathroom. State bleeding is minimal. Baby is bottle feeding with pumped breast milk. She state that her milk supply is good. Phq9 today 0. Discussed post partum depression and encouraged her to reach out to me if things change. She will follow up in 4 wks for 6 wk ppv.   Doreene Burke, CNM

## 2019-12-03 ENCOUNTER — Other Ambulatory Visit: Payer: Self-pay | Admitting: Certified Nurse Midwife

## 2019-12-04 ENCOUNTER — Telehealth: Payer: Self-pay

## 2019-12-04 ENCOUNTER — Other Ambulatory Visit: Payer: Self-pay

## 2019-12-04 MED ORDER — CITALOPRAM HYDROBROMIDE 20 MG PO TABS
20.0000 mg | ORAL_TABLET | Freq: Every day | ORAL | 2 refills | Status: DC
Start: 2019-12-04 — End: 2020-03-05

## 2019-12-04 NOTE — Telephone Encounter (Signed)
mychart message sent to patient

## 2019-12-26 ENCOUNTER — Ambulatory Visit (INDEPENDENT_AMBULATORY_CARE_PROVIDER_SITE_OTHER): Payer: BC Managed Care – PPO | Admitting: Certified Nurse Midwife

## 2019-12-26 ENCOUNTER — Encounter: Payer: Self-pay | Admitting: Certified Nurse Midwife

## 2019-12-26 ENCOUNTER — Other Ambulatory Visit: Payer: Self-pay

## 2019-12-26 MED ORDER — NORETHINDRONE 0.35 MG PO TABS: 1.0000 | ORAL_TABLET | Freq: Every day | ORAL | 11 refills | Status: DC

## 2019-12-26 NOTE — Progress Notes (Signed)
Subjective:    Danielle Wood is a 29 y.o. G68P1001 Caucasian female who presents for a postpartum visit. She is 6 weeks postpartum following a spontaneous vaginal delivery at 39.3 gestational weeks. Anesthesia: epidural. I have fully reviewed the prenatal and intrapartum course. Postpartum course has been normal. Baby's course has been normal. Baby is feeding by pumping. Bleeding no bleeding. Bowel function is normal. Bladder function is normal. Patient is sexually active. Last sexual activity: last week. Contraception method is condoms. Postpartum depression screening: negative. Score 0.  Last pap 05/08/19 and was normal..  The following portions of the patient's history were reviewed and updated as appropriate: allergies, current medications, past medical history, past surgical history and problem list.  Review of Systems Pertinent items are noted in HPI.   Vitals:   12/26/19 1410  BP: 98/66  Pulse: 77  Weight: 180 lb (81.6 kg)  Height: 5\' 9"  (1.753 m)   No LMP recorded.  Objective:   General:  alert, cooperative and no distress   Breasts:  deferred, no complaints  Lungs: clear to auscultation bilaterally  Heart:  regular rate and rhythm  Abdomen: soft, nontender   Vulva: normal  Vagina: normal vagina  Cervix:  closed  Corpus: Well-involuted  Adnexa:  Non-palpable  Rectal Exam: No hemorrhoids        Assessment:   Postpartum exam 6 wks s/p SVD pumping Depression screening Contraception counseling   Plan:  : oral progesterone-only contraceptive Follow up in: 9 months for annual  or earlier if needed  , CNM

## 2019-12-26 NOTE — Patient Instructions (Signed)
Preventive Care 21-30 Years Old, Female Preventive care refers to visits with your health care provider and lifestyle choices that can promote health and wellness. This includes:  A yearly physical exam. This may also be called an annual well check.  Regular dental visits and eye exams.  Immunizations.  Screening for certain conditions.  Healthy lifestyle choices, such as eating a healthy diet, getting regular exercise, not using drugs or products that contain nicotine and tobacco, and limiting alcohol use. What can I expect for my preventive care visit? Physical exam Your health care provider will check your:  Height and weight. This may be used to calculate body mass index (BMI), which tells if you are at a healthy weight.  Heart rate and blood pressure.  Skin for abnormal spots. Counseling Your health care provider may ask you questions about your:  Alcohol, tobacco, and drug use.  Emotional well-being.  Home and relationship well-being.  Sexual activity.  Eating habits.  Work and work environment.  Method of birth control.  Menstrual cycle.  Pregnancy history. What immunizations do I need?  Influenza (flu) vaccine  This is recommended every year. Tetanus, diphtheria, and pertussis (Tdap) vaccine  You may need a Td booster every 10 years. Varicella (chickenpox) vaccine  You may need this if you have not been vaccinated. Human papillomavirus (HPV) vaccine  If recommended by your health care provider, you may need three doses over 6 months. Measles, mumps, and rubella (MMR) vaccine  You may need at least one dose of MMR. You may also need a second dose. Meningococcal conjugate (MenACWY) vaccine  One dose is recommended if you are age 19-21 years and a first-year college student living in a residence hall, or if you have one of several medical conditions. You may also need additional booster doses. Pneumococcal conjugate (PCV13) vaccine  You may need  this if you have certain conditions and were not previously vaccinated. Pneumococcal polysaccharide (PPSV23) vaccine  You may need one or two doses if you smoke cigarettes or if you have certain conditions. Hepatitis A vaccine  You may need this if you have certain conditions or if you travel or work in places where you may be exposed to hepatitis A. Hepatitis B vaccine  You may need this if you have certain conditions or if you travel or work in places where you may be exposed to hepatitis B. Haemophilus influenzae type b (Hib) vaccine  You may need this if you have certain conditions. You may receive vaccines as individual doses or as more than one vaccine together in one shot (combination vaccines). Talk with your health care provider about the risks and benefits of combination vaccines. What tests do I need?  Blood tests  Lipid and cholesterol levels. These may be checked every 5 years starting at age 20.  Hepatitis C test.  Hepatitis B test. Screening  Diabetes screening. This is done by checking your blood sugar (glucose) after you have not eaten for a while (fasting).  Sexually transmitted disease (STD) testing.  BRCA-related cancer screening. This may be done if you have a family history of breast, ovarian, tubal, or peritoneal cancers.  Pelvic exam and Pap test. This may be done every 3 years starting at age 21. Starting at age 30, this may be done every 5 years if you have a Pap test in combination with an HPV test. Talk with your health care provider about your test results, treatment options, and if necessary, the need for more tests.   Follow these instructions at home: Eating and drinking   Eat a diet that includes fresh fruits and vegetables, whole grains, lean protein, and low-fat dairy.  Take vitamin and mineral supplements as recommended by your health care provider.  Do not drink alcohol if: ? Your health care provider tells you not to drink. ? You are  pregnant, may be pregnant, or are planning to become pregnant.  If you drink alcohol: ? Limit how much you have to 0-1 drink a day. ? Be aware of how much alcohol is in your drink. In the [place] one drink equals one 12 oz bottle of beer (355 mL), one 5 oz glass of wine (148 mL), or one 1 oz glass of hard liquor (44 mL). Lifestyle  Take daily care of your teeth and gums.  Stay active. Exercise for at least 30 minutes on 5 or more days each week.  Do not use any products that contain nicotine or tobacco, such as cigarettes, e-cigarettes, and chewing tobacco. If you need help quitting, ask your health care provider.  If you are sexually active, practice safe sex. Use a condom or other form of birth control (contraception) in order to prevent pregnancy and STIs (sexually transmitted infections). If you plan to become pregnant, see your health care provider for a preconception visit. What's next?  Visit your health care provider once a year for a well check visit.  Ask your health care provider how often you should have your eyes and teeth checked.  Stay up to date on all vaccines. This information is not intended to replace advice given to you by your health care provider. Make sure you discuss any questions you have with your health care provider. Document Revised: 01/13/2018 Document Reviewed: 01/13/2018 Elsevier Patient Education  2020 [employer].

## 2020-03-05 ENCOUNTER — Other Ambulatory Visit: Payer: Self-pay | Admitting: Certified Nurse Midwife

## 2020-03-15 ENCOUNTER — Other Ambulatory Visit: Payer: Self-pay | Admitting: Certified Nurse Midwife

## 2020-03-15 DIAGNOSIS — Z3041 Encounter for surveillance of contraceptive pills: Secondary | ICD-10-CM

## 2020-03-15 MED ORDER — LO LOESTRIN FE 1 MG-10 MCG / 10 MCG PO TABS: 1.0000 | ORAL_TABLET | Freq: Every day | ORAL | 4 refills | Status: DC

## 2020-03-15 NOTE — Progress Notes (Signed)
Rx Lo Loestrin, see orders.    Serafina Royals, CNM [facility], [facility] 03/15/20 6:59 PM

## 2020-04-17 DIAGNOSIS — Z419 Encounter for procedure for purposes other than remedying health state, unspecified: Secondary | ICD-10-CM | POA: Diagnosis not present

## 2020-05-12 ENCOUNTER — Other Ambulatory Visit: Payer: Self-pay | Admitting: Certified Nurse Midwife

## 2020-05-18 DIAGNOSIS — Z419 Encounter for procedure for purposes other than remedying health state, unspecified: Secondary | ICD-10-CM | POA: Diagnosis not present

## 2020-06-06 ENCOUNTER — Other Ambulatory Visit: Payer: Self-pay | Admitting: Certified Nurse Midwife

## 2020-06-07 NOTE — Telephone Encounter (Signed)
Please advise for refill approval. Thanks Geraldo Pitter

## 2020-06-18 DIAGNOSIS — Z419 Encounter for procedure for purposes other than remedying health state, unspecified: Secondary | ICD-10-CM | POA: Diagnosis not present

## 2020-07-09 ENCOUNTER — Encounter: Payer: BC Managed Care – PPO | Admitting: Certified Nurse Midwife

## 2020-07-09 ENCOUNTER — Encounter: Payer: Medicaid Other | Admitting: Certified Nurse Midwife

## 2020-07-16 DIAGNOSIS — Z419 Encounter for procedure for purposes other than remedying health state, unspecified: Secondary | ICD-10-CM | POA: Diagnosis not present

## 2020-07-18 ENCOUNTER — Other Ambulatory Visit: Payer: Self-pay | Admitting: Certified Nurse Midwife

## 2020-08-01 ENCOUNTER — Encounter: Payer: Self-pay | Admitting: Certified Nurse Midwife

## 2020-08-02 ENCOUNTER — Ambulatory Visit (INDEPENDENT_AMBULATORY_CARE_PROVIDER_SITE_OTHER): Payer: Medicaid Other | Admitting: Certified Nurse Midwife

## 2020-08-02 ENCOUNTER — Other Ambulatory Visit: Payer: Self-pay

## 2020-08-02 ENCOUNTER — Encounter: Payer: Self-pay | Admitting: Certified Nurse Midwife

## 2020-08-02 VITALS — BP 122/80 | HR 79 | Ht 69.0 in | Wt 190.3 lb

## 2020-08-02 DIAGNOSIS — Z01419 Encounter for gynecological examination (general) (routine) without abnormal findings: Secondary | ICD-10-CM

## 2020-08-02 DIAGNOSIS — R6882 Decreased libido: Secondary | ICD-10-CM | POA: Diagnosis not present

## 2020-08-02 DIAGNOSIS — Z8659 Personal history of other mental and behavioral disorders: Secondary | ICD-10-CM

## 2020-08-02 DIAGNOSIS — N926 Irregular menstruation, unspecified: Secondary | ICD-10-CM | POA: Diagnosis not present

## 2020-08-02 NOTE — Patient Instructions (Signed)
Norethindrone Acetate; Ethinyl Estradiol; Ferrous Fumarate Capsules or Tablets What is this medicine? NORETHINDRONE; ETHINYL ESTRADIOL; FERROUS FUMARATE (nor eth IN drone; ETH in il es tra DYE ole; FER [place] FUE ma rate) is an oral contraceptive. The products combine two types of female hormones, an estrogen and a progestin. These products prevent ovulation and pregnancy. This medicine may be used for other purposes; ask your health care provider or pharmacist if you have questions. COMMON BRAND NAME(S): Aurovela [address] 1/20, [address] [place] [address], [address] Fe, [place], [place], Gildess 24 Fe, Gildess Fe 1.5/30, [place] Fe 1/20, Hailey 24 Fe, Hailey Fe 1.5/30, Junel Fe 1.5/30, Junel Fe 1/20, Jun[place] Fe 24, [place] Fe, Lo Loestrin Fe, Loestrin 24 Fe, Loestrin FE 1.5/30, Loestrin FE 1/20, [place] 24 Fe, [place], Microgestin 24 Fe, Microgestin Fe 1.5/30, Microgestin Fe 1/20, Tarina 24 Fe, Tarina Fe 1/20, Taysofy, [place], Tilia Fe, Tri-Legest Fe What should I tell my health care provider before I take this medicine? They need to know if you have any of these conditions:  abnormal vaginal bleeding  blood vessel disease or blood clots  breast, cervical, endometrial, ovarian, liver, or uterine cancer  diabetes  gallbladder disease  having surgery  heart disease or recent heart attack  high blood pressure  high cholesterol or triglycerides  history of irregular heartbeat or heart valve problems  kidney disease  liver disease  migraine headaches  protein C deficiency  protein S deficiency  recently had a baby, miscarriage, or abortion  stroke  systemic lupus erythematosus (SLE)  tobacco smoker  an unusual or allergic reaction to estrogens, progestins, other medicines, foods, dyes, or preservatives  pregnant or trying to get pregnant  breast-feeding How should I use this medicine? Take this medicine by mouth. To reduce nausea, this medicine may be taken with food. Follow  the directions on the prescription label. Take this medicine at the same time each day and in the order directed on the package. Do not take your medicine more often than directed. A patient package insert for the product will be given with each prescription and refill. Read this sheet carefully each time. The sheet may change frequently. Contact your pediatrician regarding the use of this medicine in children. Special care may be needed. This medicine has been used in female children who have started having menstrual periods. Overdosage: If you think you have taken too much of this medicine contact a poison control center or emergency room at once. NOTE: This medicine is only for you. Do not share this medicine with others. What if I miss a dose? If you miss a dose, refer to the patient information sheet you received with your medication for direction. If you miss more than one pill, this medication may not be as effective, and you may need to use another form of birth control. What may interact with this medicine? Do not take this medicine with the following medication:  dasabuvir; ombitasvir; paritaprevir; ritonavir  ombitasvir; paritaprevir; ritonavir This medicine may also interact with the following medications:  acetaminophen  antibiotics or medicines for infections, especially rifampin, rifabutin, rifapentine, and griseofulvin, and possibly penicillins or tetracyclines  aprepitant  ascorbic acid (vitamin C)  atorvastatin  barbiturate medicines, such as phenobarbital  bosentan  carbamazepine  caffeine  clofibrate  cyclosporine  dantrolene  doxercalciferol  felbamate  grapefruit juice  hydrocortisone  medicines for anxiety or sleeping problems, such as diazepam or temazepam  medicines for diabetes, including pioglitazone  mineral oil  modafinil  mycophenolate  nefazodone  oxcarbazepine  phenytoin  prednisolone  ritonavir or other medicines for  HIV infection or AIDS  rosuvastatin  selegiline  soy isoflavones supplements  St. John's wort  tamoxifen or raloxifene  theophylline  thyroid hormones  topiramate  warfarin This list may not describe all possible interactions. Give your health care provider a list of all the medicines, herbs, non-prescription drugs, or dietary supplements you use. Also tell them if you smoke, drink alcohol, or use illegal drugs. Some items may interact with your medicine. What should I watch for while using this medicine? Visit your doctor or health care professional for regular checks on your progress. You will need a regular breast and pelvic exam and Pap smear while on this medicine. Use an additional method of contraception during the first cycle that you take these tablets. If you have any reason to think you are pregnant, stop taking this medicine right away and contact your doctor or health care professional. If you are taking this medicine for hormone related problems, it may take several cycles of use to see improvement in your condition. Smoking increases the risk of getting a blood clot or having a stroke while you are taking birth control pills, especially if you are more than 31 years old. You are strongly advised not to smoke. This medicine can make your body retain fluid, making your fingers, hands, or ankles swell. Your blood pressure can go up. Contact your doctor or health care professional if you feel you are retaining fluid. This medicine can make you more sensitive to the sun. Keep out of the sun. If you cannot avoid being in the sun, wear protective clothing and use sunscreen. Do not use sun lamps or tanning beds/booths. If you wear contact lenses and notice visual changes, or if the lenses begin to feel uncomfortable, consult your eye care specialist. In some women, tenderness, swelling, or minor bleeding of the gums may occur. Notify your dentist if this happens. Brushing and  flossing your teeth regularly may help limit this. See your dentist regularly and inform your dentist of the medicines you are taking. If you are going to have elective surgery, you may need to stop taking this medicine before the surgery. Consult your health care professional for advice. This medicine does not protect you against HIV infection (AIDS) or any other sexually transmitted diseases. What side effects may I notice from receiving this medicine? Side effects that you should report to your doctor or health care professional as soon as possible:  allergic reactions such as skin rash or itching, hives, swelling of the lips, mouth, tongue, or throat  breast tissue changes or discharge  dark patches of skin on your forehead, cheeks, upper lip, and chin  depression  high blood pressure  migraines or severe, sudden headaches  signs and symptoms of a blood clot such as breathing problems; changes in vision; chest pain; severe, sudden headache; pain, swelling, warmth in the leg; trouble speaking; sudden numbness or weakness of the face, arm or leg  stomach pain  symptoms of vaginal infection like itching, irritation or unusual discharge  yellowing of the eyes or skin Side effects that usually do not require medical attention (report these to your doctor or health care professional if they continue or are bothersome):  acne  breast pain, tenderness  irregular vaginal bleeding or spotting, particularly during the first month of use  mild headache  nausea  weight gain (slight) This list may not describe all  possible side effects. Call your doctor for medical advice about side effects. You may report side effects to FDA at 1-800-FDA-1088. Where should I keep my medicine? Keep out of the reach of children. Store at room temperature between 15 and 30 degrees C (59 and 86 degrees F). Throw away any unused medicine after the expiration date. NOTE: This sheet is a summary. It may not  cover all possible information. If you have questions about this medicine, talk to your doctor, pharmacist, or health care provider.  2021 Elsevier/Gold Standard (2020-03-25 12:27:45)   Abnormal Uterine Bleeding Abnormal uterine bleeding means bleeding more than usual from your womb (uterus). It can include:  Bleeding between menstrual periods.  Bleeding after sex.  Bleeding that is heavier than normal.  Menstrual periods that last longer than usual.  Bleeding after you have stopped having your menstrual period (menopause). There are many problems that may cause this. You should see a doctor for any kind of bleeding that is not normal. Treatment depends on the cause of the bleeding. Follow these instructions at home: Medicines  Take over-the-counter and prescription medicines only as told by your doctor.  Tell your doctor about other medicines that you take. ? If told by your doctor, stop taking aspirin or medicines that have aspirin in them. These medicines can make you bleed more.  You may be given iron pills to replace iron that your body loses because of this condition. Take them as told by your doctor. Managing constipation If you are taking iron pills, you may have trouble pooping (constipation). To prevent or treat trouble pooping, you may need to:  Drink enough fluid to keep your pee (urine) pale yellow.  Take over-the-counter or prescription medicines.  Eat foods that are high in fiber. These include beans, whole grains, and fresh fruits and vegetables.  Limit foods that are high in fat and sugar. These include fried or sweet foods. General instructions  Watch your condition for any changes.  Do not use tampons, douche, or have sex, if your doctor tells you not to.  Change your pads often.  Get regular exams. This includes pelvic exams and cervical cancer screenings. ? It is up to you to get the results of any tests that are done. Ask your doctor, or the  department that is doing the tests, when your results will be ready.  Keep all follow-up visits as told by your doctor. This is important. Contact a doctor if:  The bleeding lasts more than 1 week.  You feel dizzy at times.  You feel like you may vomit (nausea).  You vomit.  You feel light-headed or weak.  Your symptoms get worse. Get help right away if:  You pass out.  You have to change pads every hour.  You have pain in your belly.  You have a fever or chills.  You get sweaty.  You get weak.  You pass large blood clots from your vagina. Summary  Abnormal uterine bleeding means bleeding more than usual from your womb (uterus).  Any kind of bleeding that is not normal should be checked by a doctor.  Treatment depends on the cause of the bleeding.  Get help right away if you pass out, you have to change pads every hour, or you pass large blood clots from your vagina. This information is not intended to replace advice given to you by your health care provider. Make sure you discuss any questions you have with your health care provider. Document  Revised: 03/07/2019 Document Reviewed: 03/07/2019 Elsevier Patient Education  2021 [place].   Citalopram tablets What is this medicine? CITALOPRAM (sye TAL oh pram) is a medicine for depression. This medicine may be used for other purposes; ask your health care provider or pharmacist if you have questions. COMMON BRAND NAME(S): Celexa What should I tell my health care provider before I take this medicine? They need to know if you have any of these conditions:  bleeding disorders  bipolar disorder or a family history of bipolar disorder  glaucoma  heart disease  history of irregular heartbeat  kidney disease  liver disease  low levels of magnesium or potassium in the blood  receiving electroconvulsive therapy  seizures  suicidal thoughts, plans, or attempt; a previous suicide attempt by you or a  family member  take medicines that treat or prevent blood clots  thyroid disease  an unusual or allergic reaction to citalopram, escitalopram, other medicines, foods, dyes, or preservatives  pregnant or trying to become pregnant  breast-feeding How should I use this medicine? Take this medicine by mouth with a glass of water. Follow the directions on the prescription label. You can take it with or without food. Take your medicine at regular intervals. Do not take your medicine more often than directed. Do not stop taking this medicine suddenly except upon the advice of your doctor. Stopping this medicine too quickly may cause serious side effects or your condition may worsen. A special MedGuide will be given to you by the pharmacist with each prescription and refill. Be sure to read this information carefully each time. Talk to your pediatrician regarding the use of this medicine in children. Special care may be needed. Patients over 69 years old may have a stronger reaction and need a smaller dose. Overdosage: If you think you have taken too much of this medicine contact a poison control center or emergency room at once. NOTE: This medicine is only for you. Do not share this medicine with others. What if I miss a dose? If you miss a dose, take it as soon as you can. If it is almost time for your next dose, take only that dose. Do not take double or extra doses. What may interact with this medicine? Do not take this medicine with any of the following medications:  certain medicines for fungal infections like fluconazole, itraconazole, ketoconazole, posaconazole, voriconazole  cisapride  dronedarone  escitalopram  linezolid  MAOIs like Carbex, Eldepryl, Marplan, Nardil, and Parnate  methylene blue (injected into a vein)  pimozide  thioridazine This medicine may also interact with the following medications:  alcohol  amphetamines  aspirin and aspirin-like  medicines  carbamazepine  certain medicines for depression, anxiety, or psychotic disturbances  certain medicines for infections like chloroquine, clarithromycin, erythromycin, furazolidone, isoniazid, pentamidine  certain medicines for migraine headaches like almotriptan, eletriptan, frovatriptan, naratriptan, rizatriptan, sumatriptan, zolmitriptan  certain medicines for sleep  certain medicines that treat or prevent blood clots like dalteparin, enoxaparin, warfarin  cimetidine  diuretics  dofetilide  fentanyl  lithium  methadone  metoprolol  NSAIDs, medicines for pain and inflammation, like ibuprofen or naproxen  omeprazole  other medicines that prolong the QT interval (cause an abnormal heart rhythm)  procarbazine  rasagiline  supplements like St. John's wort, kava kava, valerian  tramadol  tryptophan  ziprasidone This list may not describe all possible interactions. Give your health care provider a list of all the medicines, herbs, non-prescription drugs, or dietary supplements you use. Also tell  them if you smoke, drink alcohol, or use illegal drugs. Some items may interact with your medicine. What should I watch for while using this medicine? Tell your doctor if your symptoms do not get better or if they get worse. Visit your doctor or health care professional for regular checks on your progress. Because it may take several weeks to see the full effects of this medicine, it is important to continue your treatment as prescribed by your doctor. Patients and their families should watch out for new or worsening thoughts of suicide or depression. Also watch out for sudden changes in feelings such as feeling anxious, agitated, panicky, irritable, hostile, aggressive, impulsive, severely restless, overly excited and hyperactive, or not being able to sleep. If this happens, especially at the beginning of treatment or after a change in dose, call your health care  professional. Dennis Bast may get drowsy or dizzy. Do not drive, use machinery, or do anything that needs mental alertness until you know how this medicine affects you. Do not stand or sit up quickly, especially if you are an older patient. This reduces the risk of dizzy or fainting spells. Alcohol may interfere with the effect of this medicine. Avoid alcoholic drinks. Your mouth may get dry. Chewing sugarless gum or sucking hard candy, and drinking plenty of water will help. Contact your doctor if the problem does not go away or is severe. What side effects may I notice from receiving this medicine? Side effects that you should report to your doctor or health care professional as soon as possible:  allergic reactions like skin rash, itching or hives, swelling of the face, lips, or tongue  anxious  black, tarry stools  breathing problems  changes in vision  chest pain  confusion  elevated mood, decreased need for sleep, racing thoughts, impulsive behavior  eye pain  fast, irregular heartbeat  feeling faint or lightheaded, falls  feeling agitated, angry, or irritable  hallucination, loss of contact with reality  loss of balance or coordination  loss of memory  painful or prolonged erections  restlessness, pacing, inability to keep still  seizures  stiff muscles  suicidal thoughts or other mood changes  trouble sleeping  unusual bleeding or bruising  unusually weak or tired  vomiting Side effects that usually do not require medical attention (report to your doctor or health care professional if they continue or are bothersome):  change in appetite or weight  change in sex drive or performance  dizziness  headache  increased sweating  indigestion, nausea  tremors This list may not describe all possible side effects. Call your doctor for medical advice about side effects. You may report side effects to FDA at 1-800-FDA-1088. Where should I keep my  medicine? Keep out of reach of children. Store at room temperature between 15 and 30 degrees C (59 and 86 degrees F). Throw away any unused medicine after the expiration date. NOTE: This sheet is a summary. It may not cover all possible information. If you have questions about this medicine, talk to your doctor, pharmacist, or health care provider.  2021 Elsevier/Gold Standard (2018-04-25 09:05:36)    Preventive Care 29-98 Years Old, Female Preventive care refers to lifestyle choices and visits with your health care provider that can promote health and wellness. This includes:  A yearly physical exam. This is also called an annual wellness visit.  Regular dental and eye exams.  Immunizations.  Screening for certain conditions.  Healthy lifestyle choices, such as: ? Eating a healthy  diet. ? Getting regular exercise. ? Not using drugs or products that contain nicotine and tobacco. ? Limiting alcohol use. What can I expect for my preventive care visit? Physical exam Your health care provider may check your:  Height and weight. These may be used to calculate your BMI (body mass index). BMI is a measurement that tells if you are at a healthy weight.  Heart rate and blood pressure.  Body temperature.  Skin for abnormal spots. Counseling Your health care provider may ask you questions about your:  Past medical problems.  Family's medical history.  Alcohol, tobacco, and drug use.  Emotional well-being.  Home life and relationship well-being.  Sexual activity.  Diet, exercise, and sleep habits.  Work and work Statistician.  Access to firearms.  Method of birth control.  Menstrual cycle.  Pregnancy history. What immunizations do I need? Vaccines are usually given at various ages, according to a schedule. Your health care provider will recommend vaccines for you based on your age, medical history, and lifestyle or other factors, such as travel or where you work.    What tests do I need? Blood tests  Lipid and cholesterol levels. These may be checked every 5 years starting at age 7.  Hepatitis C test.  Hepatitis B test. Screening  Diabetes screening. This is done by checking your blood sugar (glucose) after you have not eaten for a while (fasting).  STD (sexually transmitted disease) testing, if you are at risk.  BRCA-related cancer screening. This may be done if you have a family history of breast, ovarian, tubal, or peritoneal cancers.  Pelvic exam and Pap test. This may be done every 3 years starting at age 62. Starting at age 72, this may be done every 5 years if you have a Pap test in combination with an HPV test. Talk with your health care provider about your test results, treatment options, and if necessary, the need for more tests.   Follow these instructions at home: Eating and drinking  Eat a healthy diet that includes fresh fruits and vegetables, whole grains, lean protein, and low-fat dairy products.  Take vitamin and mineral supplements as recommended by your health care provider.  Do not drink alcohol if: ? Your health care provider tells you not to drink. ? You are pregnant, may be pregnant, or are planning to become pregnant.  If you drink alcohol: ? Limit how much you have to 0-1 drink a day. ? Be aware of how much alcohol is in your drink. In the [place] one drink equals one 12 oz bottle of beer (355 mL), one 5 oz glass of wine (148 mL), or one 1 oz glass of hard liquor (44 mL).   Lifestyle  Take daily care of your teeth and gums. Brush your teeth every morning and night with fluoride toothpaste. Floss one time each day.  Stay active. Exercise for at least 30 minutes 5 or more days each week.  Do not use any products that contain nicotine or tobacco, such as cigarettes, e-cigarettes, and chewing tobacco. If you need help quitting, ask your health care provider.  Do not use drugs.  If you are sexually active, practice  safe sex. Use a condom or other form of protection to prevent STIs (sexually transmitted infections).  If you do not wish to become pregnant, use a form of birth control. If you plan to become pregnant, see your health care provider for a prepregnancy visit.  Find healthy ways to cope with  stress, such as: ? Meditation, yoga, or listening to music. ? Journaling. ? Talking to a trusted person. ? Spending time with friends and family. Safety  Always wear your seat belt while driving or riding in a vehicle.  Do not drive: ? If you have been drinking alcohol. Do not ride with someone who has been drinking. ? When you are tired or distracted. ? While texting.  Wear a helmet and other protective equipment during sports activities.  If you have firearms in your house, make sure you follow all gun safety procedures.  Seek help if you have been physically or sexually abused. What's next?  Go to your health care provider once a year for an annual wellness visit.  Ask your health care provider how often you should have your eyes and teeth checked.  Stay up to date on all vaccines. This information is not intended to replace advice given to you by your health care provider. Make sure you discuss any questions you have with your health care provider. Document Revised: 12/31/2019 Document Reviewed: 01/13/2018 Elsevier Patient Education  2021 [employer].

## 2020-08-02 NOTE — Progress Notes (Signed)
Patient comes in today for annual exam. Patient has no sex drive. Patient is having irregular periods.

## 2020-08-02 NOTE — Progress Notes (Signed)
ANNUAL PREVENTATIVE CARE GYN  ENCOUNTER NOTE  Subjective:       Danielle Wood is a 30 y.o. G72P2002 female here for a routine annual gynecologic exam.  Current complaints: 1. Irregular menses with constant cramping 2. Increased anxiety 3. Low libido 4. Unhappy with current weight  Denies difficulty breathing or respiratory distress, chest pain, dysuria, and leg pain or swelling.    Gynecologic History  No LMP recorded (lmp unknown).  Contraception: OCP (estrogen/progesterone)  Last Pap: 04/2019. Results were: normal  Obstetric History  OB History  Gravida Para Term Preterm AB Living  2 2 2     2   SAB IAB Ectopic Multiple Live Births        0 2    # Outcome Date GA Lbr Len/2nd Weight Sex Delivery Anes PTL Lv  2 Term 11/07/19 [redacted]w[redacted]d   M Vag-Spont  N LIV  1 Term 10/03/15 [redacted]w[redacted]d  6 lb 10.9 oz (3.03 kg) F Vag-Spont EPI N LIV    Past Medical History:  Diagnosis Date  . Anxiety     Past Surgical History:  Procedure Laterality Date  . TONSILLECTOMY AND ADENOIDECTOMY N/A 01/14/2016   Procedure: TONSILLECTOMY AND ADENOIDECTOMY;  Surgeon: 01/16/2016, MD;  Location: [facility] ORS;  Service: ENT;  Laterality: N/A;  . WISDOM TOOTH EXTRACTION  2010    Current Outpatient Medications on File Prior to Visit  Medication Sig Dispense Refill  . ALPRAZolam (XANAX) 0.5 MG tablet Take 0.5 mg by mouth 2 (two) times daily as needed for anxiety.    . citalopram (CELEXA) 20 MG tablet Take 1 tablet by mouth once daily 30 tablet 0  . Norethindrone-Ethinyl Estradiol-Fe Biphas (LO LOESTRIN FE) 1 MG-10 MCG / 10 MCG tablet Take 1 tablet by mouth daily. 84 tablet 4  . Prenatal Vit-Fe Fumarate-FA (PRENATAL MULTIVITAMIN) TABS tablet Take 1 tablet by mouth daily at 12 noon. (Patient not taking: Reported on 08/02/2020)     No current facility-administered medications on file prior to visit.    Allergies  Allergen Reactions  . Sulfamethoxazole-Trimethoprim Other (See Comments)    Fever/chills/flu  like symptoms    Social History   Socioeconomic History  . Marital status: Married    Spouse name: Not on file  . Number of children: Not on file  . Years of education: Not on file  . Highest education level: Not on file  Occupational History  . Not on file  Tobacco Use  . Smoking status: Never Smoker  . Smokeless tobacco: Never Used  Vaping Use  . Vaping Use: Never used  Substance and Sexual Activity  . Alcohol use: No  . Drug use: No  . Sexual activity: Yes    Birth control/protection: None  Other Topics Concern  . Not on file  Social History Narrative  . Not on file   Social Determinants of Health   Financial Resource Strain: Not on file  Food Insecurity: Not on file  Transportation Needs: Not on file  Physical Activity: Not on file  Stress: Not on file  Social Connections: Not on file  Intimate Partner Violence: Not on file    Family History  Problem Relation Age of Onset  . Hypertension Father   . Atrial fibrillation Father   . COPD Maternal Grandmother   . Atrial fibrillation Maternal Grandmother   . Heart failure Maternal Grandfather   . Breast cancer Paternal Grandmother     The following portions of the patient's history were reviewed and updated as  appropriate: allergies, current medications, past family history, past medical history, past social history, past surgical history and problem list.  Review of Systems  ROS negative except as noted above. Information obtained from patient.    Objective:   BP 122/80   Pulse 79   Ht 5\' 9"  (1.753 m)   Wt 190 lb 4.8 oz (86.3 kg)   LMP  (LMP Unknown)   BMI 28.10 kg/m    CONSTITUTIONAL: Well-developed, well-nourished female in no acute distress.   PSYCHIATRIC: Normal mood and affect. Normal behavior. Normal judgment and thought content.  NEUROLGIC: Alert and oriented to person, place, and time. Normal muscle tone coordination. No cranial  nerve deficit noted.  HENT:  Normocephalic,  atraumatic.  EYES: Conjunctivae and EOM are normal.   NECK: Normal range of motion, supple, no masses.  Normal thyroid.   SKIN: Skin is warm and dry. No rash noted. Not diaphoretic. No erythema. No pallor. Professional tattoos present.   CARDIOVASCULAR: Normal heart rate noted, regular rhythm, no murmur.  RESPIRATORY: Clear to auscultation bilaterally. Effort and breath sounds normal, no problems with respiration noted.  BREASTS: Symmetric in size. No masses, skin changes, nipple drainage, or lymphadenopathy.  ABDOMEN: Soft, normal bowel sounds, no distention noted.  No tenderness, rebound or guarding.   PELVIC:  External Genitalia: Normal  Vagina: Normal  Cervix: Normal  Uterus: Normal  Adnexa: Normal  MUSCULOSKELETAL: Normal range of motion. No tenderness.  No cyanosis, clubbing, or edema.  2+ distal pulses.  LYMPHATIC: No Axillary, Supraclavicular, or Inguinal Adenopathy.  Depression screen [facility] 2/9 08/02/2020 12/26/2019 11/27/2019 07/27/2019  Decreased Interest 0 0 0 0  Down, Depressed, Hopeless 1 0 0 0  PHQ - 2 Score 1 0 0 0  Altered sleeping 3 0 0 0  Tired, decreased energy 3 0 0 1  Change in appetite 1 0 0 0  Feeling bad or failure about yourself  0 0 0 0  Trouble concentrating 0 0 0 0  Moving slowly or fidgety/restless 0 0 0 0  Suicidal thoughts 0 0 0 0  PHQ-9 Score 8 0 0 1  Difficult doing work/chores Not difficult at all - - Not difficult at all   GAD 7 : Generalized Anxiety Score 08/02/2020 11/03/2019 10/27/2019 10/19/2019  Nervous, Anxious, on Edge 3 0 0 0  Control/stop worrying 1 0 0 0  Worry too much - different things 1 0 0 0  Trouble relaxing 3 0 0 0  Restless 0 0 0 0  Easily annoyed or irritable 1 1 0 0  Afraid - awful might happen 1 0 0 0  Total GAD 7 Score 10 1 0 0  Anxiety Difficulty Not difficult at all Not difficult at all - -    Assessment:   Annual gynecologic examination 30 y.o.   Contraception: OCP (estrogen/progesterone)   Overweight    Problem List Items Addressed This Visit   None   Visit Diagnoses    Well woman exam    -  Primary   Relevant Orders   FSH/LH   Estradiol   Testosterone, Free, Total, SHBG   Progesterone   CBC   VITAMIN D 25 Hydroxy (Vit-D Deficiency, Fractures)   Irregular menses       Relevant Orders   FSH/LH   Estradiol   Testosterone, Free, Total, SHBG   Progesterone   CBC   Low libido       Relevant Orders   Estradiol   Testosterone, Free, Total, SHBG  Progesterone   VITAMIN D 25 Hydroxy (Vit-D Deficiency, Fractures)   History of anxiety       Relevant Orders   FSH/LH   Estradiol   Testosterone, Free, Total, SHBG   Progesterone   VITAMIN D 25 Hydroxy (Vit-D Deficiency, Fractures)      Plan:   Pap: Not needed  Labs: See orders  Routine preventative health maintenance measures emphasized: Exercise/Diet/Weight control, Tobacco Warnings, Alcohol/Substance use risks and Stress Management; see AVS  Discussed increasing Celexa to 30 mg PO daily with maximum dose of 40 mg PO daily, plan to increase dose after labs are reviewed  Will continued Lo Loestrin at this time pending lab results  Reviewed red flag symptoms and when to call  Return to Clinic - 1 Year for [facility] or sooner if needed   Serafina Royals, CNM  [facility], [facility] 08/02/20 2:24 PM

## 2020-08-04 LAB — CBC
Hematocrit: 39.7 % (ref 34.0–46.6)
Hemoglobin: 13.5 g/dL (ref 11.1–15.9)
MCH: 31.3 pg (ref 26.6–33.0)
MCHC: 34 g/dL (ref 31.5–35.7)
MCV: 92 fL (ref 79–97)
Platelets: 284 10*3/uL (ref 150–450)
RBC: 4.31 x10E6/uL (ref 3.77–5.28)
RDW: 12.4 % (ref 11.7–15.4)
WBC: 6.4 10*3/uL (ref 3.4–10.8)

## 2020-08-04 LAB — VITAMIN D 25 HYDROXY (VIT D DEFICIENCY, FRACTURES): Vit D, 25-Hydroxy: 31 ng/mL (ref 30.0–100.0)

## 2020-08-04 LAB — PROGESTERONE: Progesterone: <0.1 ng/mL

## 2020-08-04 LAB — TESTOSTERONE, FREE, TOTAL, SHBG
Sex Hormone Binding: 73.4 nmol/L (ref 24.6–122.0)
Testosterone, Free: 0.2 pg/mL (ref 0.0–4.2)
Testosterone: <3 ng/dL — ABNORMAL LOW (ref 13–71)

## 2020-08-04 LAB — ESTRADIOL: Estradiol: 71.5 pg/mL

## 2020-08-04 LAB — FSH/LH
FSH: 6.4 m[IU]/mL
LH: 5.8 m[IU]/mL

## 2020-08-05 ENCOUNTER — Other Ambulatory Visit: Payer: Self-pay | Admitting: Certified Nurse Midwife

## 2020-08-05 DIAGNOSIS — Z8659 Personal history of other mental and behavioral disorders: Secondary | ICD-10-CM

## 2020-08-05 DIAGNOSIS — Z3041 Encounter for surveillance of contraceptive pills: Secondary | ICD-10-CM

## 2020-08-05 MED ORDER — NORGESTIMATE-ETH ESTRADIOL 0.25-35 MG-MCG PO TABS: 1.0000 | ORAL_TABLET | Freq: Every day | ORAL | 11 refills | Status: DC

## 2020-08-05 MED ORDER — CITALOPRAM HYDROBROMIDE 10 MG PO TABS: 10.0000 mg | ORAL_TABLET | Freq: Every day | ORAL | 0 refills | Status: DC

## 2020-08-05 NOTE — Progress Notes (Signed)
Rx Celexa and Sprintec, see orders.    Serafina Royals, CNM [facility], [facility] 08/05/20 4:43 PM

## 2020-08-16 DIAGNOSIS — Z419 Encounter for procedure for purposes other than remedying health state, unspecified: Secondary | ICD-10-CM | POA: Diagnosis not present

## 2020-08-20 ENCOUNTER — Other Ambulatory Visit: Payer: Self-pay | Admitting: Certified Nurse Midwife

## 2020-08-23 NOTE — Telephone Encounter (Signed)
Spoke to pharmacy; medication approved.

## 2020-08-23 NOTE — Telephone Encounter (Signed)
Patient takes a total of 30 mg daily. Thanks, JML

## 2020-08-23 NOTE — Telephone Encounter (Signed)
Please advise on which dose pt is currently taking. Thanks [employer]

## 2020-09-15 DIAGNOSIS — Z419 Encounter for procedure for purposes other than remedying health state, unspecified: Secondary | ICD-10-CM | POA: Diagnosis not present

## 2020-09-19 ENCOUNTER — Encounter: Payer: Self-pay | Admitting: Certified Nurse Midwife

## 2020-09-25 ENCOUNTER — Other Ambulatory Visit: Payer: Self-pay | Admitting: Certified Nurse Midwife

## 2020-10-16 DIAGNOSIS — Z419 Encounter for procedure for purposes other than remedying health state, unspecified: Secondary | ICD-10-CM | POA: Diagnosis not present

## 2020-10-25 ENCOUNTER — Other Ambulatory Visit: Payer: Self-pay | Admitting: Certified Nurse Midwife

## 2020-11-04 ENCOUNTER — Other Ambulatory Visit: Payer: Self-pay | Admitting: Certified Nurse Midwife

## 2020-11-06 ENCOUNTER — Other Ambulatory Visit: Payer: Self-pay | Admitting: Certified Nurse Midwife

## 2020-11-06 ENCOUNTER — Telehealth: Payer: Self-pay | Admitting: Certified Nurse Midwife

## 2020-11-06 MED ORDER — CITALOPRAM HYDROBROMIDE 10 MG PO TABS
10.0000 mg | ORAL_TABLET | Freq: Every day | ORAL | 6 refills | Status: DC
Start: 2020-11-06 — End: 2021-06-18

## 2020-11-06 NOTE — Telephone Encounter (Signed)
Please contact patient, refill sent to pharmacy on file. Thanks, JML.

## 2020-11-06 NOTE — Telephone Encounter (Signed)
Patient called.  Patient aware.  

## 2020-11-06 NOTE — Telephone Encounter (Signed)
Danielle Wood called in and states she is supposed to have a 10mg  and a 20mg  prescription for Celexa.  She states her 20mg  prescription was approved for refills but not her 10mg  and she wants to know why.  Patient states today is her last dose of the 10mg  and she needs more and wants to know if any of the other providers can call it in for her since is out of the office.  Please advise.

## 2020-11-06 NOTE — Progress Notes (Signed)
Rx Celexa 10 mg, see orders.    Serafina Royals, CNM [facility], [facility] 11/06/20 11:43 AM

## 2020-11-15 DIAGNOSIS — Z419 Encounter for procedure for purposes other than remedying health state, unspecified: Secondary | ICD-10-CM | POA: Diagnosis not present

## 2020-12-16 DIAGNOSIS — Z419 Encounter for procedure for purposes other than remedying health state, unspecified: Secondary | ICD-10-CM | POA: Diagnosis not present

## 2021-01-16 DIAGNOSIS — Z419 Encounter for procedure for purposes other than remedying health state, unspecified: Secondary | ICD-10-CM | POA: Diagnosis not present

## 2021-02-15 DIAGNOSIS — Z419 Encounter for procedure for purposes other than remedying health state, unspecified: Secondary | ICD-10-CM | POA: Diagnosis not present

## 2021-03-18 DIAGNOSIS — Z419 Encounter for procedure for purposes other than remedying health state, unspecified: Secondary | ICD-10-CM | POA: Diagnosis not present

## 2021-04-17 DIAGNOSIS — Z419 Encounter for procedure for purposes other than remedying health state, unspecified: Secondary | ICD-10-CM | POA: Diagnosis not present

## 2021-04-21 ENCOUNTER — Ambulatory Visit (INDEPENDENT_AMBULATORY_CARE_PROVIDER_SITE_OTHER): Payer: Medicaid Other | Admitting: Certified Nurse Midwife

## 2021-04-21 ENCOUNTER — Other Ambulatory Visit: Payer: Self-pay

## 2021-04-21 ENCOUNTER — Encounter: Payer: Self-pay | Admitting: Certified Nurse Midwife

## 2021-04-21 VITALS — BP 128/80 | Ht 69.0 in | Wt 181.7 lb

## 2021-04-21 DIAGNOSIS — Z3200 Encounter for pregnancy test, result unknown: Secondary | ICD-10-CM | POA: Diagnosis not present

## 2021-04-21 LAB — POCT URINE PREGNANCY: Preg Test, Ur: POSITIVE — AB

## 2021-04-21 MED ORDER — ONDANSETRON HCL 4 MG PO TABS: 4.0000 mg | ORAL_TABLET | Freq: Three times a day (TID) | ORAL | 1 refills | Status: DC | PRN

## 2021-04-21 NOTE — Progress Notes (Signed)
Subjective:    Danielle Wood is a 30 y.o. female who presents for evaluation of amenorrhea. She believes she could be pregnant. Pregnancy is desired. Sexual Activity: single partner, contraception: none. Current symptoms also include: fatigue and nausea. Last period was irregular .   No LMP recorded (lmp unknown). Patient is pregnant. The following portions of the patient's history were reviewed and updated as appropriate: allergies, current medications, past family history, past medical history, past social history, past surgical history, and problem list.  Review of Systems Pertinent items are noted in HPI.     Objective:    BP 128/80   Ht 5\' 9"  (1.753 m)   Wt 181 lb 11.2 oz (82.4 kg)   LMP  (LMP Unknown)   BMI 26.83 kg/m  General: appears stated age and no acute distress    Lab Review Urine HCG: positive    Assessment:    Absence of menstruation.     Plan:    Pregnancy Test:  Positive: EDC: 12/19/2021. Briefly discussed pre-natal care options. MD and midwifery care reviewed. Plans to see midwives.. Encouraged well-balanced diet, plenty of rest when needed, pre-natal vitamins daily and walking for exercise. Discussed self-help for nausea, avoiding OTC medications until consulting provider or pharmacist, other than Tylenol as needed, minimal caffeine (1-2 cups daily) and avoiding alcohol. She will schedule her u/s in 1-2 wks, nurse visit @ 10 wks, initial OB visit in 5 wks.  Feel free to call with any questions.

## 2021-04-21 NOTE — Patient Instructions (Signed)
Prenatal Care ?Prenatal care is health care during pregnancy. It helps you and your unborn baby (fetus) stay as healthy as possible. Prenatal care may be provided by a midwife, a family practice doctor, a mid-level practitioner (nurse practitioner or physician assistant), or a childbirth and pregnancy doctor (obstetrician). ?How does this affect me? ?During pregnancy, you will be closely monitored for any new conditions that might develop. To lower your risk of pregnancy complications, you and your health care provider will talk about any underlying conditions you have. ?How does this affect my baby? ?Early and consistent prenatal care increases the chance that your baby will be healthy during pregnancy. Prenatal care lowers the risk that your baby will be: ?Born early (prematurely). ?Smaller than expected at birth (small for gestational age). ?What can I expect at the first prenatal care visit? ?Your first prenatal care visit will likely be the longest. You should schedule your first prenatal care visit as soon as you know that you are pregnant. Your first visit is a good time to talk about any questions or concerns you have about pregnancy. ?Medical history ?At your visit, you and your health care provider will talk about your medical history, including: ?Any past pregnancies. ?Your family's medical history. ?Medical history of the baby's father. ?Any long-term (chronic) health conditions you have and how you manage them. ?Any surgeries or procedures you have had. ?Any current over-the-counter or prescription medicines, herbs, or supplements that you are taking. ?Other factors that could pose a risk to your baby, including: ?Exposure to harmful chemicals or radiation at work or at home. ?Any substance use, including tobacco, alcohol, and drug use. ?Your home setting and your stress levels, including: ?Exposure to abuse or violence. ?Household financial strain. ?Your daily health habits, including diet and  exercise. ?Tests and screenings ?Your health care provider will: ?Measure your weight, height, and blood pressure. ?Do a physical exam, including a pelvic and breast exam. ?Perform blood tests and urine tests to check for: ?Urinary tract infection. ?Sexually transmitted infections (STIs). ?Low iron levels in your blood (anemia). ?Blood type and certain proteins on red blood cells (Rh antibodies). ?Infections and immunity to viruses, such as hepatitis B and rubella. ?HIV (human immunodeficiency virus). ?Discuss your options for genetic screening. ?Tips about staying healthy ?Your health care provider will also give you information about how to keep yourself and your baby healthy, including: ?Nutrition and taking vitamins. ?Physical activity. ?How to manage pregnancy symptoms such as nausea and vomiting (morning sickness). ?Infections and substances that may be harmful to your baby and how to avoid them. ?Food safety. ?Dental care. ?Working. ?Travel. ?Warning signs to watch for and when to call your health care provider. ?How often will I have prenatal care visits? ?After your first prenatal care visit, you will have regular visits throughout your pregnancy. The visit schedule is often as follows: ?Up to week 28 of pregnancy: once every 4 weeks. ?28-36 weeks: once every 2 weeks. ?After 36 weeks: every week until delivery. ?Some women may have visits more or less often depending on any underlying health conditions and the health of the baby. ?Keep all follow-up and prenatal care visits. This is important. ?What happens during routine prenatal care visits? ?Your health care provider will: ?Measure your weight and blood pressure. ?Check for fetal heart sounds. ?Measure the height of your uterus in your abdomen (fundal height). This may be measured starting around week 20 of pregnancy. ?Check the position of your baby inside your uterus. ?Ask questions   about your diet, sleeping patterns, and whether you can feel the baby  move. ?Review warning signs to watch for and signs of labor. ?Ask about any pregnancy symptoms you are having and how you are dealing with them. Symptoms may include: ?Headaches. ?Nausea and vomiting. ?Vaginal discharge. ?Swelling. ?Fatigue. ?Constipation. ?Changes in your vision. ?Feeling persistently sad or anxious. ?Any discomfort, including back or pelvic pain. ?Bleeding or spotting. ?Make a list of questions to ask your health care provider at your routine visits. ?What tests might I have during prenatal care visits? ?You may have blood, urine, and imaging tests throughout your pregnancy, such as: ?Urine tests to check for glucose, protein, or signs of infection. ?Glucose tests to check for a form of diabetes that can develop during pregnancy (gestational diabetes mellitus). This is usually done around week 24 of pregnancy. ?Ultrasounds to check your baby's growth and development, to check for birth defects, and to check your baby's well-being. These can also help to decide when you should deliver your baby. ?A test to check for group B strep (GBS) infection. This is usually done around week 36 of pregnancy. ?Genetic testing. This may include blood, fluid, or tissue sampling, or imaging tests, such as an ultrasound. Some genetic tests are done during the first trimester and some are done during the second trimester. ?What else can I expect during prenatal care visits? ?Your health care provider may recommend getting certain vaccines during pregnancy. These may include: ?A yearly flu shot (annual influenza vaccine). This is especially important if you will be pregnant during flu season. ?Tdap (tetanus, diphtheria, pertussis) vaccine. Getting this vaccine during pregnancy can protect your baby from whooping cough (pertussis) after birth. This vaccine may be recommended between weeks 27 and 36 of pregnancy. ?A COVID-19 vaccine. ?Later in your pregnancy, your health care provider may give you information  about: ?Childbirth and breastfeeding classes. ?Choosing a health care provider for your baby. ?Umbilical cord banking. ?Breastfeeding. ?Birth control after your baby is born. ?The hospital labor and delivery unit and how to set up a tour. ?Registering at the hospital before you go into labor. ?Where to find more information ?Office on Women's Health: womenshealth.gov ?American Pregnancy Association: americanpregnancy.org ?March of Dimes: marchofdimes.org ?Summary ?Prenatal care helps you and your baby stay as healthy as possible during pregnancy. ?Your first prenatal care visit will most likely be the longest. ?You will have visits and tests throughout your pregnancy to monitor your health and your baby's health. ?Bring a list of questions to your visits to ask your health care provider. ?Make sure to keep all follow-up and prenatal care visits. ?This information is not intended to replace advice given to you by your health care provider. Make sure you discuss any questions you have with your health care provider. ?Document Revised: 02/15/2020 Document Reviewed: 02/15/2020 ?Elsevier Patient Education ? 2022 Elsevier Inc. ? ?

## 2021-04-25 ENCOUNTER — Ambulatory Visit (INDEPENDENT_AMBULATORY_CARE_PROVIDER_SITE_OTHER): Payer: Medicaid Other

## 2021-04-25 ENCOUNTER — Other Ambulatory Visit: Payer: Self-pay

## 2021-04-25 DIAGNOSIS — Z3200 Encounter for pregnancy test, result unknown: Secondary | ICD-10-CM

## 2021-04-29 ENCOUNTER — Encounter: Payer: Self-pay | Admitting: Certified Nurse Midwife

## 2021-05-18 DIAGNOSIS — Z419 Encounter for procedure for purposes other than remedying health state, unspecified: Secondary | ICD-10-CM | POA: Diagnosis not present

## 2021-05-18 NOTE — L&D Delivery Note (Signed)
Delivery Note After receiving an epidural, the patient was very comfortable and made rapid dilation to complete over an hour's time. Mother began pushing once she felt some rectal pressure. At 1039,a viable female was delivered vaginally over an intact perineum.  The baby presented ROP, and then restituted to ROT. Easy delivery of the shoulders. Previously discussed, the FOB assisted with delivery of the torso, and helped lift the baby onto the maternal abdomen.    APGAR:7,9, ;weight 9 lbs 8 oz.   Placenta status: intact at 1046. Cord: 3 vessel.  with the following complications: none .    Anesthesia:  epidural Episiotomy:  none Lacerations:  none- timy superficial laceration noted (1 stitch place) Suture Repair: 3.0 vicryl rapide Est. Blood Loss (mL):  150  Mom to postpartum.  Baby to Couplet care / Skin to Skin.  Mirna Mires 12/13/2021, 11:09 AM

## 2021-05-21 ENCOUNTER — Other Ambulatory Visit (HOSPITAL_COMMUNITY)
Admission: RE | Admit: 2021-05-21 | Discharge: 2021-05-21 | Disposition: A | Discharge status: Home / Self Care | Payer: Medicaid Other | Source: Ambulatory Visit | Attending: Certified Nurse Midwife | Admitting: Certified Nurse Midwife

## 2021-05-21 ENCOUNTER — Ambulatory Visit (INDEPENDENT_AMBULATORY_CARE_PROVIDER_SITE_OTHER): Payer: Medicaid Other | Admitting: Certified Nurse Midwife

## 2021-05-21 ENCOUNTER — Other Ambulatory Visit: Payer: Self-pay

## 2021-05-21 VITALS — BP 117/80 | HR 96 | Wt 177.0 lb

## 2021-05-21 DIAGNOSIS — Z3481 Encounter for supervision of other normal pregnancy, first trimester: Secondary | ICD-10-CM

## 2021-05-21 DIAGNOSIS — Z124 Encounter for screening for malignant neoplasm of cervix: Secondary | ICD-10-CM | POA: Diagnosis not present

## 2021-05-21 DIAGNOSIS — Z3A11 11 weeks gestation of pregnancy: Secondary | ICD-10-CM | POA: Diagnosis not present

## 2021-05-21 NOTE — Progress Notes (Signed)
NEW OB HISTORY AND PHYSICAL  SUBJECTIVE:       Danielle Wood is a 31 y.o. G98P2002 female, No LMP recorded (lmp unknown). Patient is pregnant., Estimated Date of Delivery: 12/05/21, [redacted]w[redacted]d, presents today for establishment of Prenatal Care. She has no unusual complaints and complains       Gynecologic History No LMP recorded (lmp unknown). Patient is pregnant. Normal Contraception: none Last Pap: 05/08/2019. Results were: normal  Obstetric History OB History  Gravida Para Term Preterm AB Living  3 2 2  0 0 2  SAB IAB Ectopic Multiple Live Births  0 0 0   2    # Outcome Date GA Lbr Len/2nd Weight Sex Delivery Anes PTL Lv  3 Current           2 Term 11/07/19 [redacted]w[redacted]d   M Vag-Spont  N LIV  1 Term 10/03/15 [redacted]w[redacted]d  6 lb 10.9 oz (3.03 kg) F Vag-Spont EPI N LIV    Past Medical History:  Diagnosis Date   Anxiety     Past Surgical History:  Procedure Laterality Date   TONSILLECTOMY AND ADENOIDECTOMY N/A 01/14/2016   Procedure: TONSILLECTOMY AND ADENOIDECTOMY;  Surgeon: 01/16/2016, MD;  Location: [facility] ORS;  Service: ENT;  Laterality: N/A;   WISDOM TOOTH EXTRACTION  2010    Current Outpatient Medications on File Prior to Visit  Medication Sig Dispense Refill   ALPRAZolam (XANAX) 0.5 MG tablet Take 0.5 mg by mouth 2 (two) times daily as needed for anxiety.     citalopram (CELEXA) 10 MG tablet Take 1 tablet (10 mg total) by mouth daily. 30 tablet 6   citalopram (CELEXA) 20 MG tablet Take 1 tablet by mouth once daily 30 tablet 6   norgestimate-ethinyl estradiol (ORTHO-CYCLEN) 0.25-35 MG-MCG tablet Take 1 tablet by mouth daily. 28 tablet 11   ondansetron (ZOFRAN) 4 MG tablet Take 1 tablet (4 mg total) by mouth every 8 (eight) hours as needed for nausea or vomiting. 20 tablet 1   Prenatal Vit-Fe Fumarate-FA (PRENATAL MULTIVITAMIN) TABS tablet Take 1 tablet by mouth daily at 12 noon.     No current facility-administered medications on file prior to visit.    Allergies  Allergen  Reactions   Sulfamethoxazole-Trimethoprim Other (See Comments)    Fever/chills/flu like symptoms    Social History   Socioeconomic History   Marital status: Married    Spouse name: Not on file   Number of children: Not on file   Years of education: Not on file   Highest education level: Not on file  Occupational History   Not on file  Tobacco Use   Smoking status: Never   Smokeless tobacco: Never  Vaping Use   Vaping Use: Never used  Substance and Sexual Activity   Alcohol use: No   Drug use: No   Sexual activity: Yes    Birth control/protection: None  Other Topics Concern   Not on file  Social History Narrative   ** Merged History Encounter **       Social Determinants of Health   Financial Resource Strain: Not on file  Food Insecurity: Not on file  Transportation Needs: Not on file  Physical Activity: Not on file  Stress: Not on file  Social Connections: Not on file  Intimate Partner Violence: Not on file    Family History  Problem Relation Age of Onset   Hypertension Father    Atrial fibrillation Father    COPD Maternal Grandmother    Atrial fibrillation  Maternal Grandmother    Heart failure Maternal Grandfather    Breast cancer Paternal Grandmother     The following portions of the patient's history were reviewed and updated as appropriate: allergies, current medications, past OB history, past medical history, past surgical history, past family history, past social history, and problem list.    OBJECTIVE: Initial Physical Exam (New OB)  GENERAL APPEARANCE: alert, well appearing, in no apparent distress, oriented to person, place and time HEAD: normocephalic, atraumatic MOUTH: mucous membranes moist, pharynx normal without lesions THYROID: no thyromegaly or masses present BREASTS: no masses noted, no significant tenderness, no palpable axillary nodes, no skin changes LUNGS: clear to auscultation, no wheezes, rales or rhonchi, symmetric air  entry HEART: regular rate and rhythm, no murmurs ABDOMEN: soft, nontender, nondistended, no abnormal masses, no epigastric pain EXTREMITIES: no redness or tenderness in the calves or thighs, no edema, no limitation in range of motion, intact peripheral pulses SKIN: normal coloration and turgor, no rashes LYMPH NODES: no adenopathy palpable NEUROLOGIC: alert, oriented, normal speech, no focal findings or movement disorder noted  PELVIC EXAM EXTERNAL GENITALIA: normal appearing vulva with no masses, tenderness or lesions VAGINA: no abnormal discharge or lesions CERVIX: no lesions or cervical motion tenderness, pap collected UTERUS: gravid ADNEXA: no masses palpable and nontender OB EXAM PELVIMETRY: appears adequate RECTUM: exam not indicated  ASSESSMENT: Normal pregnancy  PLAN: Prenatal careNew OB counseling: The patient has been given an overview regarding routine prenatal care. Recommendations regarding diet, weight gain, and exercise in pregnancy were given. Prenatal testing, optional genetic testing, carrier screening , and ultrasound use in pregnancy were reviewed.  Maternit 21 today.  Benefits of Breast Feeding were discussed. The patient is encouraged to consider nursing her baby post partum.   Doreene Burke, CNM

## 2021-05-22 LAB — CBC WITH DIFFERENTIAL/PLATELET
Basophils Absolute: 0 10*3/uL (ref 0.0–0.2)
Basos: 0 %
EOS (ABSOLUTE): 0.1 10*3/uL (ref 0.0–0.4)
Eos: 1 %
Hematocrit: 34.5 % (ref 34.0–46.6)
Hemoglobin: 11.8 g/dL (ref 11.1–15.9)
Immature Grans (Abs): 0 10*3/uL (ref 0.0–0.1)
Immature Granulocytes: 0 %
Lymphocytes Absolute: 1.8 10*3/uL (ref 0.7–3.1)
Lymphs: 23 %
MCH: 29.9 pg (ref 26.6–33.0)
MCHC: 34.2 g/dL (ref 31.5–35.7)
MCV: 88 fL (ref 79–97)
Monocytes Absolute: 0.4 10*3/uL (ref 0.1–0.9)
Monocytes: 5 %
Neutrophils Absolute: 5.6 10*3/uL (ref 1.4–7.0)
Neutrophils: 71 %
Platelets: 228 10*3/uL (ref 150–450)
RBC: 3.94 x10E6/uL (ref 3.77–5.28)
RDW: 12.6 % (ref 11.7–15.4)
WBC: 7.9 10*3/uL (ref 3.4–10.8)

## 2021-05-25 ENCOUNTER — Encounter: Payer: Self-pay | Admitting: Certified Nurse Midwife

## 2021-05-25 LAB — MATERNIT 21 PLUS CORE, BLOOD
Fetal Fraction: 15
Result (T21): NEGATIVE
Trisomy 13 (Patau syndrome): NEGATIVE
Trisomy 18 (Edwards syndrome): NEGATIVE
Trisomy 21 (Down syndrome): NEGATIVE

## 2021-05-26 LAB — CYTOLOGY - PAP
Comment: NEGATIVE
Diagnosis: NEGATIVE
High risk HPV: NEGATIVE

## 2021-06-18 ENCOUNTER — Other Ambulatory Visit: Payer: Self-pay

## 2021-06-18 ENCOUNTER — Ambulatory Visit (INDEPENDENT_AMBULATORY_CARE_PROVIDER_SITE_OTHER): Payer: Medicaid Other | Admitting: Obstetrics

## 2021-06-18 ENCOUNTER — Encounter: Payer: Self-pay | Admitting: Obstetrics

## 2021-06-18 ENCOUNTER — Encounter: Payer: Medicaid Other | Admitting: Obstetrics

## 2021-06-18 VITALS — BP 116/76 | HR 106 | Wt 174.0 lb

## 2021-06-18 DIAGNOSIS — Z419 Encounter for procedure for purposes other than remedying health state, unspecified: Secondary | ICD-10-CM | POA: Diagnosis not present

## 2021-06-18 DIAGNOSIS — Z113 Encounter for screening for infections with a predominantly sexual mode of transmission: Secondary | ICD-10-CM

## 2021-06-18 DIAGNOSIS — Z3A14 14 weeks gestation of pregnancy: Secondary | ICD-10-CM

## 2021-06-18 DIAGNOSIS — Z3482 Encounter for supervision of other normal pregnancy, second trimester: Secondary | ICD-10-CM

## 2021-06-18 DIAGNOSIS — Z3402 Encounter for supervision of normal first pregnancy, second trimester: Secondary | ICD-10-CM

## 2021-06-18 DIAGNOSIS — Z3A15 15 weeks gestation of pregnancy: Secondary | ICD-10-CM

## 2021-06-18 MED ORDER — HYDROXYZINE HCL 25 MG PO TABS: 25.0000 mg | ORAL_TABLET | Freq: Three times a day (TID) | ORAL | 0 refills | Status: AC | PRN

## 2021-06-18 MED ORDER — CITALOPRAM HYDROBROMIDE 40 MG PO TABS: 40.0000 mg | ORAL_TABLET | Freq: Every day | ORAL | 6 refills | Status: DC

## 2021-06-18 NOTE — Progress Notes (Signed)
NOB Intake/ ROB: Danielle Wood presents for NOB nurse interview visit. Pregnancy confirmation done 04/21/2021.  [id number]. Pregnancy education material explained and given. No cats in the home. NOB labs ordered.  HIV labs and Drug screen were explained optional and she did not decline. Drug screen ordered. PNV encouraged. Genetic screening options discussed. Genetic testing: Done.  Pt may discuss with provider. FMLA form reviewed and signed. Pt. To follow up with Danielle Wood for [place] visit, NOB physical was done.  All questions answered.

## 2021-06-18 NOTE — Progress Notes (Signed)
ROB at [redacted]w[redacted]d. Feeling some flutters. Reports frequent dizziness with position changes. Drinking plenty of fluid. Encouraged compression stocking and slow position changes. Having some round ligament pain. She reports that her panic attacks are getting more frequent in the second trimester, which also happened in her previous pregnancy. She has taken Xanax about 3 times in the past two weeks. She is requesting a refill on that prescription. Consulted with Dr. Valentino Saxon about potential alternatives. Danielle Wood is open to increasing her Celexa to 40 mg and trying PRN hydroxyzine. She would also consider adding buspirone if these changes do not help. She states that she will continue to sparingly use the Xanax as needed because she has safety concerns about caring for her young children during a panic attack. She has other non-pharmacologic methods of self-help that she uses during a panic episode. Prenatal panel drawn today. GAD-7 score 0 today. ROB in 4 weeks. Anatomy US in 5-6 weeks.  Danielle Wood Spanish, CNM

## 2021-06-19 LAB — HCV INTERPRETATION

## 2021-06-19 LAB — URINALYSIS, ROUTINE W REFLEX MICROSCOPIC
Bilirubin, UA: NEGATIVE
Glucose, UA: NEGATIVE
Ketones, UA: NEGATIVE
Nitrite, UA: NEGATIVE
RBC, UA: NEGATIVE
Specific Gravity, UA: >=1.03 — AB (ref 1.005–1.030)
Urobilinogen, Ur: 0.2 mg/dL (ref 0.2–1.0)
pH, UA: 5.5 (ref 5.0–7.5)

## 2021-06-19 LAB — MICROSCOPIC EXAMINATION
Casts: NONE SEEN /lpf
Epithelial Cells (non renal): >10 /hpf — AB (ref 0–10)
RBC, Urine: NONE SEEN /hpf (ref 0–2)

## 2021-06-19 LAB — RUBELLA SCREEN: Rubella Antibodies, IGG: 4.71 index (ref >0.99)

## 2021-06-19 LAB — ANTIBODY SCREEN: Antibody Screen: NEGATIVE

## 2021-06-19 LAB — VARICELLA ZOSTER ANTIBODY, IGG: Varicella zoster IgG: 342 index (ref >165)

## 2021-06-19 LAB — HIV ANTIBODY (ROUTINE TESTING W REFLEX): HIV Screen 4th Generation wRfx: NONREACTIVE

## 2021-06-19 LAB — ABO AND RH: Rh Factor: NEGATIVE

## 2021-06-19 LAB — RPR: RPR Ser Ql: NONREACTIVE

## 2021-06-19 LAB — VIRAL HEPATITIS HBV, HCV
HCV Ab: <0.1 s/co ratio (ref 0.0–0.9)
Hep B Core Total Ab: NEGATIVE
Hep B Surface Ab, Qual: REACTIVE
Hepatitis B Surface Ag: NEGATIVE

## 2021-06-19 LAB — PARVOVIRUS B19 ANTIBODY, IGG AND IGM
Parvovirus B19 IgG: 0.4 index (ref 0.0–0.8)
Parvovirus B19 IgM: 0.3 index (ref 0.0–0.8)

## 2021-06-20 LAB — PAIN MGT SCRN (14 DRUGS), UR
Amphetamine Scrn, Ur: NEGATIVE ng/mL
BARBITURATE SCREEN URINE: NEGATIVE ng/mL
BENZODIAZEPINE SCREEN, URINE: POSITIVE ng/mL — AB
Buprenorphine, Urine: NEGATIVE ng/mL
CANNABINOIDS UR QL SCN: NEGATIVE ng/mL
Cocaine (Metab) Scrn, Ur: NEGATIVE ng/mL
Creatinine(Crt), U: 239.9 mg/dL (ref 20.0–300.0)
Fentanyl, Urine: NEGATIVE pg/mL
Meperidine Screen, Urine: NEGATIVE ng/mL
Methadone Screen, Urine: NEGATIVE ng/mL
OXYCODONE+OXYMORPHONE UR QL SCN: NEGATIVE ng/mL
Opiate Scrn, Ur: NEGATIVE ng/mL
Ph of Urine: 5.6 (ref 4.5–8.9)
Phencyclidine Qn, Ur: NEGATIVE ng/mL
Propoxyphene Scrn, Ur: NEGATIVE ng/mL
Tramadol Screen, Urine: NEGATIVE ng/mL

## 2021-06-20 LAB — NICOTINE SCREEN, URINE: Cotinine Ql Scrn, Ur: NEGATIVE ng/mL

## 2021-06-20 LAB — URINE CULTURE, OB REFLEX

## 2021-06-20 LAB — GC/CHLAMYDIA PROBE AMP
Chlamydia trachomatis, NAA: NEGATIVE
Neisseria Gonorrhoeae by PCR: NEGATIVE

## 2021-06-20 LAB — CULTURE, OB URINE

## 2021-06-25 ENCOUNTER — Encounter: Payer: Self-pay | Admitting: Obstetrics

## 2021-07-02 ENCOUNTER — Other Ambulatory Visit: Payer: Self-pay | Admitting: Certified Nurse Midwife

## 2021-07-16 DIAGNOSIS — Z419 Encounter for procedure for purposes other than remedying health state, unspecified: Secondary | ICD-10-CM | POA: Diagnosis not present

## 2021-07-30 ENCOUNTER — Ambulatory Visit (INDEPENDENT_AMBULATORY_CARE_PROVIDER_SITE_OTHER): Payer: Medicaid Other | Admitting: Certified Nurse Midwife

## 2021-07-30 ENCOUNTER — Other Ambulatory Visit: Payer: Self-pay

## 2021-07-30 ENCOUNTER — Ambulatory Visit (INDEPENDENT_AMBULATORY_CARE_PROVIDER_SITE_OTHER): Payer: Medicaid Other

## 2021-07-30 VITALS — BP 108/70 | Wt 177.0 lb

## 2021-07-30 DIAGNOSIS — Z3A20 20 weeks gestation of pregnancy: Secondary | ICD-10-CM | POA: Diagnosis not present

## 2021-07-30 DIAGNOSIS — Z3482 Encounter for supervision of other normal pregnancy, second trimester: Secondary | ICD-10-CM

## 2021-07-30 NOTE — Progress Notes (Signed)
No vb. No lof. Anatomy scan today. 

## 2021-07-30 NOTE — Progress Notes (Signed)
ROB and u/s for anatomy today. See below result. Results reviewed with pt all questions answered. PT discussed that she tried medication that Missy ordered for panic attacks but that it did not work for her so she will continue to use xanax sparingly for attacks ( she is aware of potential risk as this was discussed at her NOB and with her last pregnancy). Discussed round ligament pain and self help measures . She verbalizes and agrees to plan of care. Follow up 4 wks with Missy for ROB.  ? ?Doreene Burke, CNM  ? ?Patient Name: Danielle Wood ?DOB: [birth year] ?MRN: [id number] ?ULTRASOUND REPORT ? ?Location: [facility] ?Date of Service: 07/30/2021  ? ?Indications:Anatomy Ultrasound ?Findings:  ?Singleton intrauterine pregnancy is visualized with FHR at 138 BPM.  ? ?Biometrics give an (U/S) Gestational age of [redacted]w[redacted]d and an (U/S) EDD of 12/13/21; this correlates with the clinically established Estimated Date of Delivery: 12/14/21  ? ?Fetal presentation is Breech.  ?EFW: 385g / 14oz. ?Placenta: anterior. Grade: 0 ?AFI: subjectively normal. ? ?Anatomic survey is complete and normal; Gender - surprise.   ? ?Ovaries are not visualized. ?Survey of the adnexa demonstrates no adnexal masses. ? ?There is no free peritoneal fluid in the cul de sac. ? ?Impression: ?1. [redacted]w[redacted]d Viable Singleton Intrauterine pregnancy by U/S. ?2. (U/S) EDD is consistent with Clinically established Estimated Date of Delivery: 12/14/21 . ?3. Normal Anatomy Scan ? ?Recommendations: ?1.Clinical correlation with the patient's History and Physical Exam. ? ?Sheralyn Boatman  [place] ?

## 2021-07-30 NOTE — Patient Instructions (Signed)
Round Ligament Pain The round ligaments are a pair of cord-like tissues that help support the uterus. They can become a source of pain during pregnancy as the ligaments soften and stretch as the baby grows. The pain usually begins in the second trimester (13-28 weeks) of pregnancy, and should only last for a few seconds when it occurs. However, the pain can come and go until the baby is delivered. The pain does not cause harm to the baby. Round ligament pain is usually a short, sharp, and pinching pain, but it can also be a dull, lingering, and aching pain. The pain is felt in the lower side of the abdomen or in the groin. It usually starts deep in the groin and moves up to the outside of the hip area. The pain may happen when you: Suddenly change position, such as quickly going from a sitting to standing position. Do physical activity. Cough or sneeze. Follow these instructions at home: Managing pain  When the pain starts, relax. Then, try any of these methods to help with the pain: Sit down. Flex your knees up to your abdomen. Lie on your side with one pillow under your abdomen and another pillow between your legs. Sit in a warm bath for 15-20 minutes or until the pain goes away. General instructions Watch your condition for any changes. Move slowly when you sit down or stand up. Stop or reduce your physical activities if they cause pain. Avoid long walks if they cause pain. Take over-the-counter and prescription medicines only as told by your health care provider. Keep all follow-up visits. This is important. Contact a health care provider if: Your pain does not go away with treatment. You feel pain in your back that you did not have before. Your medicine is not helping. You have a fever or chills. You have nausea or vomiting. You have diarrhea. You have pain when you urinate. Get help right away if: You have pain that is a rhythmic, cramping pain similar to labor pains. Labor pains  are usually 2 minutes apart, last for about 1 minute, and involve a bearing down feeling or pressure in your pelvis. You have vaginal bleeding. These symptoms may represent a serious problem that is an emergency. Do not wait to see if the symptoms will go away. Get medical help right away. Call your local emergency services (911 in the [place] Do not drive yourself to the hospital. Summary Round ligament pain is felt in the lower abdomen or groin. This pain usually begins in the second trimester (13-28 weeks) and should only last for a few seconds when it occurs. You may notice the pain when you suddenly change position, when you cough or sneeze, or during physical activity. Relaxing, flexing your knees to your abdomen, lying on one side, or taking a warm bath may help to get rid of the pain. Contact your health care provider if the pain does not go away. This information is not intended to replace advice given to you by your health care provider. Make sure you discuss any questions you have with your health care provider. Document Revised: 07/17/2020 Document Reviewed: 07/17/2020 Elsevier Patient Education  2022 Elsevier Inc.  

## 2021-08-16 DIAGNOSIS — Z419 Encounter for procedure for purposes other than remedying health state, unspecified: Secondary | ICD-10-CM | POA: Diagnosis not present

## 2021-08-27 ENCOUNTER — Encounter: Payer: Medicaid Other | Admitting: Obstetrics

## 2021-08-29 ENCOUNTER — Encounter: Payer: Medicaid Other | Admitting: Obstetrics

## 2021-09-03 ENCOUNTER — Ambulatory Visit (INDEPENDENT_AMBULATORY_CARE_PROVIDER_SITE_OTHER): Payer: Medicaid Other | Admitting: Obstetrics

## 2021-09-03 VITALS — BP 113/78 | HR 71 | Wt 180.0 lb

## 2021-09-03 DIAGNOSIS — Z3482 Encounter for supervision of other normal pregnancy, second trimester: Secondary | ICD-10-CM

## 2021-09-03 DIAGNOSIS — Z3A20 20 weeks gestation of pregnancy: Secondary | ICD-10-CM

## 2021-09-03 DIAGNOSIS — Z3A25 25 weeks gestation of pregnancy: Secondary | ICD-10-CM

## 2021-09-03 LAB — POCT URINALYSIS DIPSTICK OB
Glucose, UA: NEGATIVE
POC,PROTEIN,UA: NEGATIVE

## 2021-09-03 NOTE — Progress Notes (Signed)
ROB at [redacted]w[redacted]d. Good fetal movement. Denies LOF, vaginal bleeding, cramping. Having lots of BH ctx. Staying hydrated. Discussed 1-hour glucose, CBC, RPR at next visit. No concerns today. RTC in 3 weeks for ROB. ? ?Guadlupe Spanish, CNM ?

## 2021-09-15 DIAGNOSIS — Z419 Encounter for procedure for purposes other than remedying health state, unspecified: Secondary | ICD-10-CM | POA: Diagnosis not present

## 2021-09-23 ENCOUNTER — Encounter: Payer: Self-pay | Admitting: Obstetrics

## 2021-09-23 ENCOUNTER — Ambulatory Visit (INDEPENDENT_AMBULATORY_CARE_PROVIDER_SITE_OTHER): Payer: Medicaid Other | Admitting: Obstetrics

## 2021-09-23 ENCOUNTER — Other Ambulatory Visit: Payer: Medicaid Other

## 2021-09-23 VITALS — BP 119/84 | HR 85 | Wt 181.3 lb

## 2021-09-23 DIAGNOSIS — Z2913 Encounter for prophylactic Rho(D) immune globulin: Secondary | ICD-10-CM | POA: Diagnosis not present

## 2021-09-23 DIAGNOSIS — Z113 Encounter for screening for infections with a predominantly sexual mode of transmission: Secondary | ICD-10-CM

## 2021-09-23 DIAGNOSIS — Z23 Encounter for immunization: Secondary | ICD-10-CM

## 2021-09-23 DIAGNOSIS — Z131 Encounter for screening for diabetes mellitus: Secondary | ICD-10-CM

## 2021-09-23 DIAGNOSIS — R82998 Other abnormal findings in urine: Secondary | ICD-10-CM | POA: Diagnosis not present

## 2021-09-23 DIAGNOSIS — Z3483 Encounter for supervision of other normal pregnancy, third trimester: Secondary | ICD-10-CM

## 2021-09-23 DIAGNOSIS — Z3A28 28 weeks gestation of pregnancy: Secondary | ICD-10-CM | POA: Diagnosis not present

## 2021-09-23 LAB — POCT URINALYSIS DIPSTICK OB
Bilirubin, UA: NEGATIVE
Blood, UA: NEGATIVE
Glucose, UA: NEGATIVE
Nitrite, UA: NEGATIVE
POC,PROTEIN,UA: NEGATIVE
Spec Grav, UA: 1.02 (ref 1.010–1.025)
Urobilinogen, UA: 0.2 E.U./dL
pH, UA: 6 (ref 5.0–8.0)

## 2021-09-23 MED ORDER — RHO D IMMUNE GLOBULIN 1500 UNIT/2ML IJ SOSY
300.0000 ug | PREFILLED_SYRINGE | Freq: Once | INTRAMUSCULAR | Status: AC
  Administered 2021-09-23: 300 ug via INTRAMUSCULAR

## 2021-09-23 NOTE — Progress Notes (Signed)
ROB at [redacted]w[redacted]d. Active baby. Having a lot of round ligament pain. Reviewed comfort measures. Denies ctx, LOF, and vaginal bleeding. Mood has been good. Has only needed Xanax once since last visit. Discussed birth plan - plans an epidural. Would like to BF baby initially and then pump. Husband will get a vasectomy after baby is born. Avo plans OCPs until that is completed. TDaP, Rhogam, RSB, BTC done.  Declines RPR. Okay with RPR at hospital admission. CBC, 1-hour glucose today. RTC in 2 weeks. ? ?Danielle Wood Spanish, CNM ?

## 2021-09-23 NOTE — Progress Notes (Signed)
ROB. Patient states fetal movement with round ligament pain.  Patient completed GCT, received Rhogam and TDAP injection and signed BTC today. Patient states no questions or concerns at this time.  ? ? ?

## 2021-09-24 LAB — CBC WITH DIFFERENTIAL/PLATELET
Basophils Absolute: 0 10*3/uL (ref 0.0–0.2)
Basos: 0 %
EOS (ABSOLUTE): 0.1 10*3/uL (ref 0.0–0.4)
Eos: 1 %
Hematocrit: 31.7 % — ABNORMAL LOW (ref 34.0–46.6)
Hemoglobin: 10.8 g/dL — ABNORMAL LOW (ref 11.1–15.9)
Immature Grans (Abs): 0 10*3/uL (ref 0.0–0.1)
Immature Granulocytes: 0 %
Lymphocytes Absolute: 1.4 10*3/uL (ref 0.7–3.1)
Lymphs: 19 %
MCH: 31.5 pg (ref 26.6–33.0)
MCHC: 34.1 g/dL (ref 31.5–35.7)
MCV: 92 fL (ref 79–97)
Monocytes Absolute: 0.4 10*3/uL (ref 0.1–0.9)
Monocytes: 6 %
Neutrophils Absolute: 5.2 10*3/uL (ref 1.4–7.0)
Neutrophils: 74 %
Platelets: 239 10*3/uL (ref 150–450)
RBC: 3.43 x10E6/uL — ABNORMAL LOW (ref 3.77–5.28)
RDW: 12.7 % (ref 11.7–15.4)
WBC: 7 10*3/uL (ref 3.4–10.8)

## 2021-09-24 LAB — GLUCOSE TOLERANCE, 1 HOUR: Glucose, 1Hr PP: 94 mg/dL (ref 70–199)

## 2021-09-26 ENCOUNTER — Encounter: Payer: Self-pay | Admitting: Obstetrics

## 2021-09-26 ENCOUNTER — Other Ambulatory Visit: Payer: Self-pay | Admitting: Obstetrics

## 2021-09-26 LAB — URINE CULTURE

## 2021-09-26 MED ORDER — NITROFURANTOIN MONOHYD MACRO 100 MG PO CAPS: 100.0000 mg | ORAL_CAPSULE | Freq: Two times a day (BID) | ORAL | 0 refills | Status: DC

## 2021-09-26 MED ORDER — FUSION PLUS PO CAPS: 1.0000 | ORAL_CAPSULE | Freq: Every day | ORAL | 1 refills | Status: DC

## 2021-10-10 ENCOUNTER — Encounter: Payer: Self-pay | Admitting: Obstetrics

## 2021-10-10 ENCOUNTER — Ambulatory Visit (INDEPENDENT_AMBULATORY_CARE_PROVIDER_SITE_OTHER): Payer: Medicaid Other | Admitting: Obstetrics

## 2021-10-10 VITALS — BP 126/74 | HR 85 | Wt 182.1 lb

## 2021-10-10 DIAGNOSIS — O99891 Other specified diseases and conditions complicating pregnancy: Secondary | ICD-10-CM | POA: Diagnosis not present

## 2021-10-10 DIAGNOSIS — Z3A30 30 weeks gestation of pregnancy: Secondary | ICD-10-CM | POA: Diagnosis not present

## 2021-10-10 DIAGNOSIS — M549 Dorsalgia, unspecified: Secondary | ICD-10-CM | POA: Diagnosis not present

## 2021-10-10 LAB — POCT URINALYSIS DIPSTICK
Bilirubin, UA: NEGATIVE
Blood, UA: NEGATIVE
Glucose, UA: NEGATIVE
Nitrite, UA: NEGATIVE
Protein, UA: POSITIVE — AB
Spec Grav, UA: 1.015 (ref 1.010–1.025)
Urobilinogen, UA: 0.2 E.U./dL
pH, UA: 6 (ref 5.0–8.0)

## 2021-10-10 NOTE — Progress Notes (Signed)
ROB at [redacted]w[redacted]d. Active baby. Denies ctx, LOF, vaginal bleeding. Continues to have back and neck pain; little relief from heat, shower, stretching, support belt. Recommend chiropractor, Tylenol PRN, BioFreeze.  Woke up with a sharp pain along the length of right side of abdomen. It has not improved or gotten worse. It was painful to touch earlier but is not now. Likely musculoskeletal. Recommend rest, avoiding lifting children when possible, good body mechanics. Return if it gets worse. Had a panic attack today, but that is the only one since her last visit. Has been unable to tolerate iron supplement. Reviewed dietary iron sources, liquid iron, alfalfa supplements Recheck CBC at 36 weeks if unable to tolerate. RTC in 2 weeks.  Lloyd Huger, CNM

## 2021-10-13 LAB — URINE CULTURE

## 2021-10-14 ENCOUNTER — Encounter: Payer: Self-pay | Admitting: Obstetrics

## 2021-10-16 DIAGNOSIS — Z419 Encounter for procedure for purposes other than remedying health state, unspecified: Secondary | ICD-10-CM | POA: Diagnosis not present

## 2021-10-23 ENCOUNTER — Ambulatory Visit (INDEPENDENT_AMBULATORY_CARE_PROVIDER_SITE_OTHER): Payer: Medicaid Other | Admitting: Obstetrics

## 2021-10-23 VITALS — BP 121/80 | HR 120 | Wt 183.0 lb

## 2021-10-23 DIAGNOSIS — Z3A32 32 weeks gestation of pregnancy: Secondary | ICD-10-CM

## 2021-10-23 LAB — POCT URINALYSIS DIPSTICK OB
Bilirubin, UA: NEGATIVE
Blood, UA: NEGATIVE
Glucose, UA: NEGATIVE
Nitrite, UA: NEGATIVE
Spec Grav, UA: 1.015 (ref 1.010–1.025)
Urobilinogen, UA: 0.2 E.U./dL
pH, UA: 7.5 (ref 5.0–8.0)

## 2021-10-23 NOTE — Progress Notes (Signed)
ROB at [redacted]w[redacted]d. Active baby. Denies ctx, LOF, vaginal bleeding. Still having some low back pain. Has tried some comfort measures. Experiencing anxiety/restless legs at night. Recommend magnesium and Unisom or Benadryl for occasional sleep aid. Planning on traveling to the beach during 36th week week of pregnancy. Discussed timing of GBS swab. RTC in 2 weeks.  Guadlupe Spanish, CNM

## 2021-11-10 ENCOUNTER — Ambulatory Visit (INDEPENDENT_AMBULATORY_CARE_PROVIDER_SITE_OTHER): Payer: Medicaid Other | Admitting: Obstetrics

## 2021-11-10 ENCOUNTER — Other Ambulatory Visit (HOSPITAL_COMMUNITY)
Admission: RE | Admit: 2021-11-10 | Discharge: 2021-11-10 | Disposition: A | Discharge status: Home / Self Care | Payer: Medicaid Other | Source: Ambulatory Visit | Attending: Obstetrics | Admitting: Obstetrics

## 2021-11-10 ENCOUNTER — Encounter: Payer: Self-pay | Admitting: Obstetrics

## 2021-11-10 VITALS — BP 122/82 | HR 112 | Wt 184.6 lb

## 2021-11-10 DIAGNOSIS — Z3A35 35 weeks gestation of pregnancy: Secondary | ICD-10-CM | POA: Diagnosis not present

## 2021-11-10 DIAGNOSIS — Z113 Encounter for screening for infections with a predominantly sexual mode of transmission: Secondary | ICD-10-CM | POA: Diagnosis not present

## 2021-11-10 LAB — POCT URINALYSIS DIPSTICK OB
Bilirubin, UA: NEGATIVE
Blood, UA: NEGATIVE
Clarity, UA: NEGATIVE
Glucose, UA: NEGATIVE
Nitrite, UA: NEGATIVE
Spec Grav, UA: 1.01 (ref 1.010–1.025)
Urobilinogen, UA: 0.2 E.U./dL
pH, UA: 7 (ref 5.0–8.0)

## 2021-11-10 NOTE — Progress Notes (Addendum)
ROB at [redacted]w[redacted]d. Active baby. Feeling well overall. Initial BP elevated, repeat 122/82. Prepared at home for baby. Danielle Wood has had a run of contractions after a busy week and some light bloody mucus. She is planning on going to the beach this week and strongly desires a cervical exam because she is concerned about preterm labor. She states that she checked her own cervix and was 1 cm. Discussed pros and cons of cervical exam, and she would like to proceed. External os 1/internal os closed/thick/-2. GBS and GC/chlamydia collected since she will be out of town all next week. Discussed travel precautions and locating the nearest hospital. She reports her anxiety has been somewhat increased but she has not been having panic attacks. Reviewed urine dip - she has not been doing the clean catch procedure, which explains the multiple contaminated sample. Instructed on proper clean catch for next visit. RTC in 2 weeks.   Danielle Wood Spanish, CNM

## 2021-11-11 DIAGNOSIS — Z3A35 35 weeks gestation of pregnancy: Secondary | ICD-10-CM | POA: Diagnosis not present

## 2021-11-11 DIAGNOSIS — Z3493 Encounter for supervision of normal pregnancy, unspecified, third trimester: Secondary | ICD-10-CM | POA: Diagnosis not present

## 2021-11-14 LAB — CERVICOVAGINAL ANCILLARY ONLY
Chlamydia: NEGATIVE
Comment: NEGATIVE
Comment: NORMAL
Neisseria Gonorrhea: NEGATIVE

## 2021-11-15 DIAGNOSIS — Z419 Encounter for procedure for purposes other than remedying health state, unspecified: Secondary | ICD-10-CM | POA: Diagnosis not present

## 2021-11-15 LAB — CULTURE, BETA STREP (GROUP B ONLY): Strep Gp B Culture: NEGATIVE

## 2021-11-27 ENCOUNTER — Encounter: Payer: Self-pay | Admitting: Obstetrics

## 2021-11-27 ENCOUNTER — Ambulatory Visit (INDEPENDENT_AMBULATORY_CARE_PROVIDER_SITE_OTHER): Payer: Medicaid Other | Admitting: Obstetrics

## 2021-11-27 VITALS — BP 121/82 | HR 87 | Wt 189.6 lb

## 2021-11-27 DIAGNOSIS — Z3A37 37 weeks gestation of pregnancy: Secondary | ICD-10-CM

## 2021-11-27 LAB — POCT URINALYSIS DIPSTICK OB
Appearance: NEGATIVE
Bilirubin, UA: NEGATIVE
Blood, UA: NEGATIVE
Glucose, UA: NEGATIVE
Ketones, UA: NEGATIVE
Leukocytes, UA: NEGATIVE
Nitrite, UA: NEGATIVE
POC,PROTEIN,UA: NEGATIVE
Spec Grav, UA: 1.02 (ref 1.010–1.025)
Urobilinogen, UA: 0.2 E.U./dL
pH, UA: 6 (ref 5.0–8.0)

## 2021-11-27 NOTE — Progress Notes (Signed)
ROB at [redacted]w[redacted]d. Active baby. Still having some runs of contractions and pelvic pressure. Denies LOF and vaginal bleeding. Had a good trip to the beach. Reviewed negative GBS results and when to go to the hospital. Everything is ready at home and bags are packed! Car seat in car. Requests SVE today: 1/30/ballotable, cervix is anterior. RTC in one week for ROB.

## 2021-12-03 ENCOUNTER — Ambulatory Visit (INDEPENDENT_AMBULATORY_CARE_PROVIDER_SITE_OTHER): Payer: Medicaid Other | Admitting: Certified Nurse Midwife

## 2021-12-03 ENCOUNTER — Encounter: Payer: Self-pay | Admitting: Certified Nurse Midwife

## 2021-12-03 VITALS — BP 109/80 | HR 100

## 2021-12-03 DIAGNOSIS — Z3A38 38 weeks gestation of pregnancy: Secondary | ICD-10-CM

## 2021-12-03 NOTE — Progress Notes (Signed)
ROB doing well, feeling good movement. Herbal prep hand out given. Labor precautions reviewed.SVE per pt request 2-3/65%/-2. Follow up 1 wk or prn.   Doreene Burke, CNM

## 2021-12-03 NOTE — Patient Instructions (Signed)
Braxton Hicks Contractions  Contractions of the uterus can occur throughout pregnancy, but they are not always a sign that you are in labor. You may have practice contractions called Braxton Hicks contractions. These false labor contractions are sometimes confused with true labor. What are Braxton Hicks contractions? Braxton Hicks contractions are tightening movements that occur in the muscles of the uterus before labor. Unlike true labor contractions, these contractions do not result in opening (dilation) and thinning of the lowest part of the uterus (cervix). Toward the end of pregnancy (32-34 weeks), Braxton Hicks contractions can happen more often and may become stronger. These contractions are sometimes difficult to tell apart from true labor because they can be very uncomfortable. How to tell the difference between true labor and false labor True labor Contractions last 30-70 seconds. Contractions become very regular. Discomfort is usually felt in the top of the uterus, and it spreads to the lower abdomen and low back. Contractions do not go away with walking. Contractions usually become stronger and more frequent. The cervix dilates and gets thinner. False labor Contractions are usually shorter, weaker, and farther apart than true labor contractions. Contractions are usually irregular. Contractions are often felt in the front of the lower abdomen and in the groin. Contractions may go away when you walk around or change positions while lying down. The cervix usually does not dilate or become thin. Sometimes, the only way to tell if you are in true labor is for your health care provider to look for changes in your cervix. Your health care provider will do a physical exam and may monitor your contractions. If you are in true labor, your health care provider will send you home with instructions about when to return to the hospital. You may continue to have Braxton Hicks contractions until you  go into true labor. Follow these instructions at home:  Take over-the-counter and prescription medicines only as told by your health care provider. If Braxton Hicks contractions are making you uncomfortable: Change your position from lying down or resting to walking, or change from walking to resting. Sit and rest in a tub of warm water. Drink enough fluid to keep your urine pale yellow. Dehydration may cause these contractions. Do slow and deep breathing several times an hour. Keep all follow-up visits. This is important. Contact a health care provider if: You have a fever. You have continuous pain in your abdomen. Your contractions become stronger, more regular, and closer together. You pass blood-tinged mucus. Get help right away if: You have fluid leaking or gushing from your vagina. You have bright red blood coming from your vagina. Your baby is not moving inside you as much as it used to. Summary You may have practice contractions called Braxton Hicks contractions. These false labor contractions are sometimes confused with true labor. Braxton Hicks contractions are usually shorter, weaker, farther apart, and less regular than true labor contractions. True labor contractions usually become stronger, more regular, and more frequent. Manage discomfort from Braxton Hicks contractions by changing position, resting in a warm bath, practicing deep breathing, and drinking plenty of water. Keep all follow-up visits. Contact your health care provider if your contractions become stronger, more regular, and closer together. This information is not intended to replace advice given to you by your health care provider. Make sure you discuss any questions you have with your health care provider. Document Revised: 03/11/2020 Document Reviewed: 03/11/2020 Elsevier Patient Education  2023 Elsevier Inc.  

## 2021-12-06 ENCOUNTER — Encounter: Payer: Self-pay | Admitting: Obstetrics

## 2021-12-06 ENCOUNTER — Observation Stay
Admission: EM | Admit: 2021-12-06 | Discharge: 2021-12-06 | Disposition: A | Discharge status: Home / Self Care | Payer: Medicaid Other | Attending: Licensed Practical Nurse | Admitting: Licensed Practical Nurse

## 2021-12-06 ENCOUNTER — Other Ambulatory Visit: Payer: Self-pay

## 2021-12-06 DIAGNOSIS — Z3A38 38 weeks gestation of pregnancy: Secondary | ICD-10-CM | POA: Diagnosis not present

## 2021-12-06 DIAGNOSIS — Z79899 Other long term (current) drug therapy: Secondary | ICD-10-CM | POA: Insufficient documentation

## 2021-12-06 DIAGNOSIS — O471 False labor at or after 37 completed weeks of gestation: Secondary | ICD-10-CM | POA: Diagnosis not present

## 2021-12-06 DIAGNOSIS — O479 False labor, unspecified: Secondary | ICD-10-CM | POA: Diagnosis present

## 2021-12-06 DIAGNOSIS — O26893 Other specified pregnancy related conditions, third trimester: Secondary | ICD-10-CM | POA: Diagnosis not present

## 2021-12-06 DIAGNOSIS — R109 Unspecified abdominal pain: Secondary | ICD-10-CM | POA: Diagnosis not present

## 2021-12-06 MED ORDER — LACTATED RINGERS IV BOLUS
1000.0000 mL | Freq: Once | INTRAVENOUS | Status: AC
  Administered 2021-12-06: 1000 mL via INTRAVENOUS

## 2021-12-06 NOTE — Discharge Summary (Signed)
Physician Final Progress Note  Patient ID: Danielle Wood MRN: [id number] DOB/AGE: 1990/09/06 31 y.o.  Admit date: 12/06/2021 Admitting provider: Horald Pollen, MD Discharge date: 12/06/2021   Admission Diagnoses:  1) intrauterine pregnancy at [redacted]w[redacted]d  2) uterine contractions  Discharge Diagnoses:  Active Problems:   * No active hospital problems. *  Not in labor   History of Present Illness: The patient is a 31 y.o. female G3P2002 at [redacted]w[redacted]d who presents for uterine contractions. Contractions started around 11pm,they are every 3 mins,  they have not changed in frequency or intensity. Endorses +FM, denies any LOF/VB.  Her exam in the office on 7/19 was 2-3/65/-2. Her exam on arrival was 2.5/70/-2, while in triage her cervix remained unchanged.    Past Medical History:  Diagnosis Date   Anxiety     Past Surgical History:  Procedure Laterality Date   TONSILLECTOMY AND ADENOIDECTOMY N/A 01/14/2016   Procedure: TONSILLECTOMY AND ADENOIDECTOMY;  Surgeon: Linus Salmons, MD;  Location: [facility] ORS;  Service: ENT;  Laterality: N/A;   WISDOM TOOTH EXTRACTION  2010    No current facility-administered medications on file prior to encounter.   Current Outpatient Medications on File Prior to Encounter  Medication Sig Dispense Refill   ALPRAZolam (XANAX) 0.5 MG tablet Take 0.5 mg by mouth 2 (two) times daily as needed for anxiety.     citalopram (CELEXA) 40 MG tablet Take 1 tablet (40 mg total) by mouth daily. 30 tablet 6   nitrofurantoin, macrocrystal-monohydrate, (MACROBID) 100 MG capsule Take 1 capsule (100 mg total) by mouth 2 (two) times daily. (Patient not taking: Reported on 11/27/2021) 14 capsule 0   ondansetron (ZOFRAN) 4 MG tablet TAKE 1 TABLET BY MOUTH EVERY 8 HOURS AS NEEDED FOR NAUSEA AND VOMITING 20 tablet 0   Prenatal Vit-Fe Fumarate-FA (PRENATAL MULTIVITAMIN) TABS tablet Take 1 tablet by mouth daily at 12 noon.      Allergies  Allergen Reactions    Sulfamethoxazole-Trimethoprim Other (See Comments)    Fever/chills/flu like symptoms    Social History   Socioeconomic History   Marital status: Married    Spouse name: Not on file   Number of children: Not on file   Years of education: Not on file   Highest education level: Not on file  Occupational History   Not on file  Tobacco Use   Smoking status: Never   Smokeless tobacco: Never  Vaping Use   Vaping Use: Never used  Substance and Sexual Activity   Alcohol use: No   Drug use: No   Sexual activity: Yes    Birth control/protection: None  Other Topics Concern   Not on file  Social History Narrative   ** Merged History Encounter **       Social Determinants of Health   Financial Resource Strain: Not on file  Food Insecurity: Not on file  Transportation Needs: Not on file  Physical Activity: Not on file  Stress: Not on file  Social Connections: Not on file  Intimate Partner Violence: Not on file    Family History  Problem Relation Age of Onset   Hypertension Father    Atrial fibrillation Father    COPD Maternal Grandmother    Atrial fibrillation Maternal Grandmother    Heart failure Maternal Grandfather    Breast cancer Paternal Grandmother      Review of Systems  Constitutional: Negative.   Respiratory: Negative.    Cardiovascular: Negative.   Gastrointestinal:        Uterine  contractions   Genitourinary: Negative.   Psychiatric/Behavioral:         Hx of anxiety      Physical Exam: BP 118/78   Pulse 83   Temp 98.4 F (36.9 C) (Oral)   Resp 18   LMP  (LMP Unknown)   Physical Exam Constitutional:      Appearance: Normal appearance.  Pulmonary:     Effort: Pulmonary effort is normal.  Abdominal:     Comments: Gravid   Neurological:     Mental Status: Danielle Wood is alert.  Psychiatric:        Mood and Affect: Mood normal.   VE 2.5/70/-2 per RN  EFM: baseline 125, moderate variability, pos accel, neg decel  TOCO: q2-5 with irritability    Consults: None  Significant Findings/ Diagnostic Studies: NA  Procedures: RNST   Hospital Course: The patient was admitted to Labor and Delivery Triage for observation. Danielle Wood was observed around 2 hours.  There was no change in exam or contraction frequency/intensity. Recommend being discharged home.   Discharge Condition: stable Keep next ROB  Disposition:   Diet: Regular diet  Discharge Activity: Activity as tolerated   Allergies as of 12/06/2021       Reactions   Sulfamethoxazole-trimethoprim Other (See Comments)   Fever/chills/flu like symptoms        Medication List     STOP taking these medications    nitrofurantoin (macrocrystal-monohydrate) 100 MG capsule Commonly known as: MACROBID       TAKE these medications    ALPRAZolam 0.5 MG tablet Commonly known as: XANAX Take 0.5 mg by mouth 2 (two) times daily as needed for anxiety.   citalopram 40 MG tablet Commonly known as: CeleXA Take 1 tablet (40 mg total) by mouth daily.   ondansetron 4 MG tablet Commonly known as: ZOFRAN TAKE 1 TABLET BY MOUTH EVERY 8 HOURS AS NEEDED FOR NAUSEA AND VOMITING   prenatal multivitamin Tabs tablet Take 1 tablet by mouth daily at 12 noon.         SignedEllouise Newer June Rode, CNM  12/06/2021, 5:09 AM

## 2021-12-06 NOTE — OB Triage Note (Signed)
Pt arrived to unit wheeled by ED staff with complaints of contractions. Pt denies symptoms consistent with active bleeding or LOF. Pt consents for treatment signed and medical history reviewed. Pt reports active fetal movement. Pt placed on EFM and Toco on non tender area of abdomen. Will notify provider on call of patient arrival and chief complaint.

## 2021-12-06 NOTE — OB Triage Note (Signed)
Danielle Wood, CNM Provider notified of patient's chief complaint of contractions. Provider gave TO to monitor patient and recheck 2 hours after first check  and notify of change.   Will notify patient on plan of care.

## 2021-12-06 NOTE — OB Triage Note (Signed)
Pt discharged to home after triaged for R/O labor. Patient verbalized understanding of s/s to return. Patient encouraged to keep follow up appointment and converse with provider if she wishes to be induced. Patient left unit ambulatory with spouse at side to be driven home in private passenger vehicle. Patient took all personal belongings including boppy pillow, clothing, cell phone, and hospital bag. L&D triage complete.

## 2021-12-09 ENCOUNTER — Encounter: Payer: Self-pay | Admitting: Certified Nurse Midwife

## 2021-12-10 NOTE — Telephone Encounter (Signed)
Called pt, no answer, LVMTRC. 

## 2021-12-11 ENCOUNTER — Telehealth: Payer: Self-pay | Admitting: Certified Nurse Midwife

## 2021-12-11 ENCOUNTER — Encounter: Payer: Self-pay | Admitting: Certified Nurse Midwife

## 2021-12-11 ENCOUNTER — Ambulatory Visit (INDEPENDENT_AMBULATORY_CARE_PROVIDER_SITE_OTHER): Payer: Medicaid Other | Admitting: Certified Nurse Midwife

## 2021-12-11 VITALS — BP 120/83 | HR 134 | Wt 189.0 lb

## 2021-12-11 DIAGNOSIS — Z3A39 39 weeks gestation of pregnancy: Secondary | ICD-10-CM

## 2021-12-11 LAB — POCT URINALYSIS DIPSTICK OB
Bilirubin, UA: NEGATIVE
Blood, UA: NEGATIVE
Glucose, UA: NEGATIVE
Ketones, UA: NEGATIVE
Leukocytes, UA: NEGATIVE
Nitrite, UA: NEGATIVE
POC,PROTEIN,UA: NEGATIVE
Spec Grav, UA: 1.02 (ref 1.010–1.025)
Urobilinogen, UA: 0.2 E.U./dL
pH, UA: 6 (ref 5.0–8.0)

## 2021-12-11 NOTE — Telephone Encounter (Signed)
Pt states "She would like to remain with provider Doreene Burke". Pt states she does not want to see provider Guadlupe Spanish for her upcoming 1 wk ROB. Please advise

## 2021-12-11 NOTE — Progress Notes (Signed)
ROB doing well, feeling good movement. Discussed induction 40-41 wk. She is in agreement. SVE per pt request 3-4/70/-2. Membranes swept per pt request. Will work on getting induction scheduled. Pt to follow up 1 wk for NST and ROB should she not labor prior to visit.   Doreene Burke, CNM

## 2021-12-11 NOTE — Patient Instructions (Signed)
Braxton Hicks Contractions  Contractions of the uterus can occur throughout pregnancy, but they are not always a sign that you are in labor. You may have practice contractions called Braxton Hicks contractions. These false labor contractions are sometimes confused with true labor. What are Braxton Hicks contractions? Braxton Hicks contractions are tightening movements that occur in the muscles of the uterus before labor. Unlike true labor contractions, these contractions do not result in opening (dilation) and thinning of the lowest part of the uterus (cervix). Toward the end of pregnancy (32-34 weeks), Braxton Hicks contractions can happen more often and may become stronger. These contractions are sometimes difficult to tell apart from true labor because they can be very uncomfortable. How to tell the difference between true labor and false labor True labor Contractions last 30-70 seconds. Contractions become very regular. Discomfort is usually felt in the top of the uterus, and it spreads to the lower abdomen and low back. Contractions do not go away with walking. Contractions usually become stronger and more frequent. The cervix dilates and gets thinner. False labor Contractions are usually shorter, weaker, and farther apart than true labor contractions. Contractions are usually irregular. Contractions are often felt in the front of the lower abdomen and in the groin. Contractions may go away when you walk around or change positions while lying down. The cervix usually does not dilate or become thin. Sometimes, the only way to tell if you are in true labor is for your health care provider to look for changes in your cervix. Your health care provider will do a physical exam and may monitor your contractions. If you are in true labor, your health care provider will send you home with instructions about when to return to the hospital. You may continue to have Braxton Hicks contractions until you  go into true labor. Follow these instructions at home:  Take over-the-counter and prescription medicines only as told by your health care provider. If Braxton Hicks contractions are making you uncomfortable: Change your position from lying down or resting to walking, or change from walking to resting. Sit and rest in a tub of warm water. Drink enough fluid to keep your urine pale yellow. Dehydration may cause these contractions. Do slow and deep breathing several times an hour. Keep all follow-up visits. This is important. Contact a health care provider if: You have a fever. You have continuous pain in your abdomen. Your contractions become stronger, more regular, and closer together. You pass blood-tinged mucus. Get help right away if: You have fluid leaking or gushing from your vagina. You have bright red blood coming from your vagina. Your baby is not moving inside you as much as it used to. Summary You may have practice contractions called Braxton Hicks contractions. These false labor contractions are sometimes confused with true labor. Braxton Hicks contractions are usually shorter, weaker, farther apart, and less regular than true labor contractions. True labor contractions usually become stronger, more regular, and more frequent. Manage discomfort from Braxton Hicks contractions by changing position, resting in a warm bath, practicing deep breathing, and drinking plenty of water. Keep all follow-up visits. Contact your health care provider if your contractions become stronger, more regular, and closer together. This information is not intended to replace advice given to you by your health care provider. Make sure you discuss any questions you have with your health care provider. Document Revised: 03/11/2020 Document Reviewed: 03/11/2020 Elsevier Patient Education  2023 Elsevier Inc.  

## 2021-12-13 ENCOUNTER — Encounter: Payer: Self-pay | Admitting: Obstetrics

## 2021-12-13 ENCOUNTER — Other Ambulatory Visit: Payer: Self-pay

## 2021-12-13 ENCOUNTER — Inpatient Hospital Stay: Payer: Medicaid Other | Admitting: Anesthesiology

## 2021-12-13 ENCOUNTER — Inpatient Hospital Stay
Admission: EM | Admit: 2021-12-13 | Discharge: 2021-12-14 | DRG: 807 | Disposition: A | Discharge status: Home / Self Care | Payer: Medicaid Other | Attending: Obstetrics | Admitting: Obstetrics

## 2021-12-13 DIAGNOSIS — R030 Elevated blood-pressure reading, without diagnosis of hypertension: Secondary | ICD-10-CM | POA: Diagnosis present

## 2021-12-13 DIAGNOSIS — F419 Anxiety disorder, unspecified: Secondary | ICD-10-CM | POA: Diagnosis not present

## 2021-12-13 DIAGNOSIS — O99344 Other mental disorders complicating childbirth: Secondary | ICD-10-CM | POA: Diagnosis not present

## 2021-12-13 DIAGNOSIS — Z3A39 39 weeks gestation of pregnancy: Secondary | ICD-10-CM | POA: Diagnosis not present

## 2021-12-13 DIAGNOSIS — O26893 Other specified pregnancy related conditions, third trimester: Secondary | ICD-10-CM | POA: Diagnosis not present

## 2021-12-13 LAB — TYPE AND SCREEN
ABO/RH(D): O NEG
Antibody Screen: POSITIVE

## 2021-12-13 LAB — CBC
HCT: 31.4 % — ABNORMAL LOW (ref 36.0–46.0)
Hemoglobin: 10.8 g/dL — ABNORMAL LOW (ref 12.0–15.0)
MCH: 29.9 pg (ref 26.0–34.0)
MCHC: 34.4 g/dL (ref 30.0–36.0)
MCV: 87 fL (ref 80.0–100.0)
Platelets: 253 10*3/uL (ref 150–400)
RBC: 3.61 MIL/uL — ABNORMAL LOW (ref 3.87–5.11)
RDW: 12.9 % (ref 11.5–15.5)
WBC: 9.1 10*3/uL (ref 4.0–10.5)
nRBC: 0 % (ref 0.0–0.2)

## 2021-12-13 LAB — PROTEIN / CREATININE RATIO, URINE
Creatinine, Urine: 49 mg/dL
Total Protein, Urine: <6 mg/dL

## 2021-12-13 MED ORDER — PHENYLEPHRINE 80 MCG/ML (10ML) SYRINGE FOR IV PUSH (FOR BLOOD PRESSURE SUPPORT)
80.0000 ug | PREFILLED_SYRINGE | INTRAVENOUS | Status: DC | PRN
  Filled 2021-12-13: qty 10

## 2021-12-13 MED ORDER — DIPHENHYDRAMINE HCL 25 MG PO CAPS: 25.0000 mg | ORAL_CAPSULE | Freq: Four times a day (QID) | ORAL | Status: DC | PRN

## 2021-12-13 MED ORDER — LIDOCAINE HCL (PF) 1 % IJ SOLN: 30.0000 mL | INTRAMUSCULAR | Status: DC | PRN

## 2021-12-13 MED ORDER — DIPHENHYDRAMINE HCL 50 MG/ML IJ SOLN: 12.5000 mg | INTRAMUSCULAR | Status: DC | PRN

## 2021-12-13 MED ORDER — ZOLPIDEM TARTRATE 5 MG PO TABS: 5.0000 mg | ORAL_TABLET | Freq: Every evening | ORAL | Status: DC | PRN

## 2021-12-13 MED ORDER — FENTANYL-BUPIVACAINE-NACL 0.5-0.125-0.9 MG/250ML-% EP SOLN
12.0000 mL/h | EPIDURAL | Status: DC | PRN
  Administered 2021-12-13: 12 mL/h via EPIDURAL

## 2021-12-13 MED ORDER — COCONUT OIL OIL
1.0000 | TOPICAL_OIL | Status: DC | PRN
Start: 2021-12-13 — End: 2021-12-14

## 2021-12-13 MED ORDER — LACTATED RINGERS IV SOLN
500.0000 mL | Freq: Once | INTRAVENOUS | Status: AC
Start: 2021-12-13 — End: 2021-12-13
  Administered 2021-12-13: 500 mL via INTRAVENOUS

## 2021-12-13 MED ORDER — BENZOCAINE-MENTHOL 20-0.5 % EX AERO: 1.0000 | INHALATION_SPRAY | CUTANEOUS | Status: DC | PRN

## 2021-12-13 MED ORDER — LACTATED RINGERS IV SOLN: INTRAVENOUS | Status: DC

## 2021-12-13 MED ORDER — ONDANSETRON HCL 4 MG/2ML IJ SOLN
4.0000 mg | Freq: Four times a day (QID) | INTRAMUSCULAR | Status: DC | PRN
  Administered 2021-12-13: 4 mg via INTRAVENOUS
  Filled 2021-12-13: qty 2

## 2021-12-13 MED ORDER — LACTATED RINGERS IV SOLN: 500.0000 mL | INTRAVENOUS | Status: DC | PRN

## 2021-12-13 MED ORDER — SIMETHICONE 80 MG PO CHEW: 80.0000 mg | CHEWABLE_TABLET | ORAL | Status: DC | PRN

## 2021-12-13 MED ORDER — OXYTOCIN BOLUS FROM INFUSION
333.0000 mL | Freq: Once | INTRAVENOUS | Status: AC
  Administered 2021-12-13: 333 mL via INTRAVENOUS

## 2021-12-13 MED ORDER — LIDOCAINE HCL (PF) 1 % IJ SOLN
INTRAMUSCULAR | Status: AC
  Filled 2021-12-13: qty 30

## 2021-12-13 MED ORDER — ACETAMINOPHEN 325 MG PO TABS: 650.0000 mg | ORAL_TABLET | ORAL | Status: DC | PRN

## 2021-12-13 MED ORDER — CITALOPRAM HYDROBROMIDE 20 MG PO TABS
40.0000 mg | ORAL_TABLET | Freq: Every day | ORAL | Status: DC
Start: 2021-12-13 — End: 2021-12-14
  Administered 2021-12-13: 40 mg via ORAL
  Filled 2021-12-13 (×2): qty 2

## 2021-12-13 MED ORDER — LIDOCAINE HCL (PF) 1 % IJ SOLN
INTRAMUSCULAR | Status: DC | PRN
  Administered 2021-12-13: 3 mL via SUBCUTANEOUS

## 2021-12-13 MED ORDER — EPHEDRINE 5 MG/ML INJ: 10.0000 mg | INTRAVENOUS | Status: DC | PRN

## 2021-12-13 MED ORDER — FENTANYL CITRATE (PF) 100 MCG/2ML IJ SOLN
50.0000 ug | INTRAMUSCULAR | Status: DC | PRN
  Administered 2021-12-13: 100 ug via INTRAVENOUS
  Filled 2021-12-13: qty 2

## 2021-12-13 MED ORDER — ONDANSETRON HCL 4 MG/2ML IJ SOLN: 4.0000 mg | INTRAMUSCULAR | Status: DC | PRN

## 2021-12-13 MED ORDER — MISOPROSTOL 200 MCG PO TABS
ORAL_TABLET | ORAL | Status: AC
  Filled 2021-12-13: qty 4

## 2021-12-13 MED ORDER — HYDROXYZINE HCL 25 MG PO TABS: 50.0000 mg | ORAL_TABLET | Freq: Four times a day (QID) | ORAL | Status: DC | PRN

## 2021-12-13 MED ORDER — ONDANSETRON HCL 4 MG PO TABS: 4.0000 mg | ORAL_TABLET | ORAL | Status: DC | PRN

## 2021-12-13 MED ORDER — DOCUSATE SODIUM 100 MG PO CAPS
100.0000 mg | ORAL_CAPSULE | Freq: Two times a day (BID) | ORAL | Status: DC
  Administered 2021-12-14: 100 mg via ORAL
  Filled 2021-12-13: qty 1

## 2021-12-13 MED ORDER — LACTATED RINGERS IV BOLUS
500.0000 mL | Freq: Once | INTRAVENOUS | Status: AC
  Administered 2021-12-13: 500 mL via INTRAVENOUS

## 2021-12-13 MED ORDER — OXYTOCIN 10 UNIT/ML IJ SOLN
INTRAMUSCULAR | Status: AC
  Filled 2021-12-13: qty 2

## 2021-12-13 MED ORDER — DIBUCAINE (PERIANAL) 1 % EX OINT: 1.0000 | TOPICAL_OINTMENT | CUTANEOUS | Status: DC | PRN

## 2021-12-13 MED ORDER — PHENYLEPHRINE 80 MCG/ML (10ML) SYRINGE FOR IV PUSH (FOR BLOOD PRESSURE SUPPORT)
80.0000 ug | PREFILLED_SYRINGE | INTRAVENOUS | Status: DC | PRN
  Administered 2021-12-13: 160 ug via INTRAVENOUS
  Administered 2021-12-13: 80 ug via INTRAVENOUS

## 2021-12-13 MED ORDER — SOD CITRATE-CITRIC ACID 500-334 MG/5ML PO SOLN: 30.0000 mL | ORAL | Status: DC | PRN

## 2021-12-13 MED ORDER — WITCH HAZEL-GLYCERIN EX PADS: 1.0000 | MEDICATED_PAD | CUTANEOUS | Status: DC | PRN

## 2021-12-13 MED ORDER — AMMONIA AROMATIC IN INHA
RESPIRATORY_TRACT | Status: AC
  Filled 2021-12-13: qty 10

## 2021-12-13 MED ORDER — IBUPROFEN 600 MG PO TABS
600.0000 mg | ORAL_TABLET | Freq: Four times a day (QID) | ORAL | Status: DC
  Administered 2021-12-13 – 2021-12-14 (×4): 600 mg via ORAL
  Filled 2021-12-13 (×4): qty 1

## 2021-12-13 MED ORDER — FENTANYL-BUPIVACAINE-NACL 0.5-0.125-0.9 MG/250ML-% EP SOLN
EPIDURAL | Status: AC
  Filled 2021-12-13: qty 250

## 2021-12-13 MED ORDER — OXYTOCIN-SODIUM CHLORIDE 30-0.9 UT/500ML-% IV SOLN
2.5000 [IU]/h | INTRAVENOUS | Status: DC
  Administered 2021-12-13: 2.5 [IU]/h via INTRAVENOUS

## 2021-12-13 MED ORDER — LIDOCAINE-EPINEPHRINE (PF) 1.5 %-1:200000 IJ SOLN
INTRAMUSCULAR | Status: DC | PRN
  Administered 2021-12-13: 3 mL via EPIDURAL

## 2021-12-13 MED ORDER — PRENATAL MULTIVITAMIN CH
1.0000 | ORAL_TABLET | Freq: Every day | ORAL | Status: DC
  Administered 2021-12-13: 1 via ORAL
  Filled 2021-12-13 (×2): qty 1

## 2021-12-13 MED ORDER — SODIUM CHLORIDE 0.9 % IV SOLN
INTRAVENOUS | Status: DC | PRN
  Administered 2021-12-13: 5 mL via EPIDURAL

## 2021-12-13 NOTE — Progress Notes (Signed)
Pt arrived to unit with complaints of contractions. Pt denies LOF or symptoms associated with vaginal bleeding. TOCO and ecternal monitor placed on non tender area of abdomen. Willnotify provider of patients arrival.

## 2021-12-13 NOTE — OB Triage Note (Addendum)
LABOR & DELIVERY OB TRIAGE NOTE  SUBJECTIVE  HPI Danielle Wood is a 31 y.o. G3P2002 at [redacted]w[redacted]d who presents to Labor & Delivery for contractions that started around 2000 last night. They have progressively gotten stronger and closer together. She has had a small amount of bloody show. She denies LOF. She endorses fetal movement. She reports a headache for most of the day that is about 4/10 on the pain scale. She had some visual changes earlier that resolved.   OB History     Gravida  3   Para  2   Term  2   Preterm  0   AB  0   Living  2      SAB  0   IAB  0   Ectopic  0   Multiple      Live Births  2           Scheduled Meds:  ammonia       lidocaine (PF)       misoprostol       oxytocin       Continuous Infusions: PRN Meds:.acetaminophen, ammonia, lidocaine (PF), misoprostol, oxytocin  OBJECTIVE  BP 133/90   Pulse 64   Temp 98.4 F (36.9 C) (Oral)   Ht 5\' 9"  (1.753 m)   Wt 86 kg   LMP  (LMP Unknown)   BMI 28.00 kg/m   Cardiac: RRR Lungs: CTAB Abdomen: soft, gravid, non-tender  NST I reviewed the NST and it was reactive.  Baseline: 120 Variability: moderate Accelerations: present Decelerations:none Toco: q 1.5-2, mild/moderate to palpation Category 1  ASSESSMENT Impression  1) Pregnancy at , [redacted]w[redacted]d, Estimated Date of Delivery: 12/14/21 2) Reassuring maternal/fetal status 3) Minimal cervical change over 2 hours 4) One mild range BP  PLAN  1) Observe another hour and recheck for cervical change 2) Urine protein/creatinine ration  12/16/21, CNM

## 2021-12-13 NOTE — OB Triage Note (Signed)
Notified Quitman Livings, CNM of patient's arrival and chief complaint with SBAR. New orders given. See orders and MAR. Will notify patient on plan of care.

## 2021-12-13 NOTE — Anesthesia Preprocedure Evaluation (Signed)
Anesthesia Evaluation  Patient identified by MRN, date of birth, ID band Patient awake    Reviewed: Allergy & Precautions, H&P , NPO status , Patient's Chart, lab work & pertinent test results  History of Anesthesia Complications Negative for: history of anesthetic complications  Airway Mallampati: II       Dental no notable dental hx.    Pulmonary neg pulmonary ROS,           Cardiovascular negative cardio ROS       Neuro/Psych PSYCHIATRIC DISORDERS Anxiety negative neurological ROS     GI/Hepatic Neg liver ROS, GERD  ,  Endo/Other  negative endocrine ROS  Renal/GU negative Renal ROS     Musculoskeletal negative musculoskeletal ROS (+)   Abdominal   Peds  Hematology negative hematology ROS (+)   Anesthesia Other Findings Past Medical History: No date: Anxiety   Reproductive/Obstetrics (+) Pregnancy                             Anesthesia Physical  Anesthesia Plan  ASA: 2  Anesthesia Plan: Epidural   Post-op Pain Management:    Induction: Intravenous  PONV Risk Score and Plan:   Airway Management Planned: Oral ETT  Additional Equipment:   Intra-op Plan:   Post-operative Plan:   Informed Consent: I have reviewed the patients History and Physical, chart, labs and discussed the procedure including the risks, benefits and alternatives for the proposed anesthesia with the patient or authorized representative who has indicated his/her understanding and acceptance.       Plan Discussed with: Anesthesiologist  Anesthesia Plan Comments:         Anesthesia Quick Evaluation

## 2021-12-13 NOTE — Progress Notes (Signed)
Danielle Wood is a 31 y.o. G3P2002 at [redacted]w[redacted]d by ultrasound admitted for active labor. She is now very comfortable with her epidural. Hsad some hypotension post epidural insertion, but is feeling fine , and not dizzy. She denies any rectal pressure.  Subjective: Feels tired, and very comfortable.Husband and her mother are present and supportive.   Objective: BP 118/74   Pulse 60   Temp 97.6 F (36.4 C) (Oral)   Resp 18   Ht 5\' 9"  (1.753 m)   Wt 86 kg   LMP  (LMP Unknown)   SpO2 100%   BMI 28.00 kg/m  No intake/output data recorded. No intake/output data recorded.  FHT:  FHR: 115 bpm, variability: moderate,  accelerations:  Present,  decelerations:  Absent UC:   regular, every 2.5-3 minutes SVE:   Dilation: 8 Effacement (%): 80 Station: -2 Exam by:: [email or web address] CNM  Labs: Lab Results  Component Value Date   WBC 9.1 12/13/2021   HGB 10.8 (L) 12/13/2021   HCT 31.4 (L) 12/13/2021   MCV 87.0 12/13/2021   PLT 253 12/13/2021    Assessment / Plan: Spontaneous labor, progressing normally  Labor: Progressing normally  Fetal Wellbeing:  Category I Pain Control:  Epidural I/D:  n/a Anticipated MOD:  NSVD Anticipate delivery within an hour or so. Delivery table is available.  12/15/2021, CNM 12/13/2021, 9:37 AM

## 2021-12-13 NOTE — Discharge Summary (Signed)
Postpartum Discharge Summary  Date of Service updated***     Patient Name: Danielle Wood DOB: 25-May-1990 MRN: [id number]  Date of admission: 12/13/2021 Delivery date:12/13/2021  Delivering provider: Imagene Riches  Date of discharge: 12/13/2021  Admitting diagnosis: Labor and delivery, indication for care [O75.9] Intrauterine pregnancy: [redacted]w[redacted]d    Secondary diagnosis:  Active Problems:   Labor and delivery, indication for care   Postpartum care following vaginal delivery  Additional problems: none    Discharge diagnosis: Term Pregnancy Delivered                                              Post partum procedures: none Augmentation: N/A Complications: None  Hospital course: Onset of Labor With Vaginal Delivery      31y.o. yo G3P3003 at 350w6das admitted in Active Labor on 12/13/2021. Patient had an uncomplicated labor course as follows:  Membrane Rupture Time/Date: 9:26 AM ,12/13/2021   Delivery Method:Vaginal, Spontaneous  Episiotomy: None  Lacerations:  None  Patient had an uncomplicated postpartum course.  She is ambulating, tolerating a regular diet, passing flatus, and urinating well. Patient is discharged home in stable condition on 12/13/21.  Newborn Data: Birth date:12/13/2021  Birth time:10:39 AM  Gender:Female  Living status:Living  Apgars:7 ,9  Weight:4310 g   Magnesium Sulfate received: No BMZ received: No Rhophylac:N/A MMR:No T-DaP:Given prenatally Flu: No Transfusion:No  Physical exam  Vitals:   12/13/21 1045 12/13/21 1055 12/13/21 1100 12/13/21 1115  BP: 115/75  127/73 126/87  Pulse: 81  66 70  Resp:      Temp:   98 F (36.7 C)   TempSrc:   Oral   SpO2: 100% 100%    Weight:      Height:       General: {Exam; general:21111117} Lochia: {Desc; appropriate/inappropriate:30686::"appropriate"} Uterine Fundus: {Desc; firm/soft:30687} Incision: {Exam; incision:21111123} DVT Evaluation: {Exam; dvt:2111122} Labs: Lab Results  Component  Value Date   WBC 9.1 12/13/2021   HGB 10.8 (L) 12/13/2021   HCT 31.4 (L) 12/13/2021   MCV 87.0 12/13/2021   PLT 253 12/13/2021      Latest Ref Rng & Units 12/01/2018    8:50 AM  CMP  Glucose 65 - 99 mg/dL 77    Edinburgh Score:    11/08/2019    8:30 AM  Edinburgh Postnatal Depression Scale Screening Tool  I have been able to laugh and see the funny side of things. 0  I have looked forward with enjoyment to things. 0  I have blamed myself unnecessarily when things went wrong. 0  I have been anxious or worried for no good reason. 2  I have felt scared or panicky for no good reason. 1  Things have been getting on top of me. 0  I have been so unhappy that I have had difficulty sleeping. 0  I have felt sad or miserable. 0  I have been so unhappy that I have been crying. 0  The thought of harming myself has occurred to me. 0  Edinburgh Postnatal Depression Scale Total 3      After visit meds:  Allergies as of 12/13/2021       Reactions   Sulfamethoxazole-trimethoprim Other (See Comments)   Fever/chills/flu like symptoms     Med Rec must be completed prior to using [facility]  Discharge home in stable condition Infant Feeding: Bottle and Breast Infant Disposition:home with mother Discharge instruction: per After Visit Summary and Postpartum booklet. Activity: Advance as tolerated. Pelvic rest for 6 weeks.  Diet: routine diet Anticipated Birth Control: OCPs and vasectomy Postpartum Appointment:6 weeks Additional Postpartum F/U: Postpartum Depression checkup Future Appointments: Future Appointments  Date Time Provider [place]  12/17/2021  8:00 AM Philip Aspen, CNM [facility] None  12/17/2021  8:15 AM [facility] NST ROOM [facility] None   Follow up Visit:  Follow-up Information     Philip Aspen, CNM Follow up in 2 week(s).   Specialties: Certified Nurse Midwife, Radiology Why: Please plan on a video visit with Deneise Lever in two weeks, and then an in  person visit at 6 weeks post delivery for your physical. Contact information: [address] [place] [address] [phone]                     12/13/2021 Imagene Riches, CNM

## 2021-12-13 NOTE — OB Triage Note (Signed)
Notified provider Guadlupe Spanish, CNM of patient's strip tracing status and cervical exam. Provider stated to recheck SVE in one hour.  New order given for p/c ratio. Will notify patient on plan of care.

## 2021-12-13 NOTE — H&P (Signed)
History and Physical   HPI  Danielle Wood is a 31 y.o. G3P2002 at [redacted]w[redacted]d Estimated Date of Delivery: 12/14/21 who is being admitted for labor management. Her contractions started around 2000 last night. She demonstrated cervical change from 4 cm to 5 cm in triage. She has a history of anxiety disorder with Xanax and Celexa use during pregnancy. She has had one elevated BP during this admission   OB History  OB History  Gravida Para Term Preterm AB Living  3 2 2  0 0 2  SAB IAB Ectopic Multiple Live Births  0 0 0 0 2    # Outcome Date GA Lbr Len/2nd Weight Sex Delivery Anes PTL Lv  3 Current           2 Term 11/07/19 [redacted]w[redacted]d   M Vag-Spont  N LIV  1 Term 10/03/15 [redacted]w[redacted]d  3030 g F Vag-Spont EPI N LIV     Name: Danielle Wood     Apgar1: 9  Apgar5: 9    PROBLEM LIST  Pregnancy complications or risks: Patient Active Problem List   Diagnosis Date Noted   Labor and delivery, indication for care 12/13/2021   Uterine contractions 12/06/2021   History of placenta abruption 07/27/2019   Panic attacks 07/27/2019   Family history of chromosomal abnormality 03/28/2015    Prenatal labs and studies: ABO, Rh: O/Negative/-- (02/01 1145) Antibody: Negative (02/01 1145) Rubella: 4.71 (02/01 1145) RPR: Non Reactive (02/01 1145)  HBsAg: Negative (02/01 1145)  HIV: Non Reactive (02/01 1145)  02-03-1981-- (06/27 0839)   Past Medical History:  Diagnosis Date   Anxiety      Past Surgical History:  Procedure Laterality Date   TONSILLECTOMY AND ADENOIDECTOMY N/A 01/14/2016   Procedure: TONSILLECTOMY AND ADENOIDECTOMY;  Surgeon: 01/16/2016, MD;  Location: [facility] ORS;  Service: ENT;  Laterality: N/A;   WISDOM TOOTH EXTRACTION  2010     Medications    Current Discharge Medication List     CONTINUE these medications which have NOT CHANGED   Details  ALPRAZolam (XANAX) 0.5 MG tablet Take 0.5 mg by mouth 2 (two) times daily as needed for anxiety.    citalopram (CELEXA) 40 MG tablet  Take 1 tablet (40 mg total) by mouth daily. Qty: 30 tablet, Refills: 6    ondansetron (ZOFRAN) 4 MG tablet TAKE 1 TABLET BY MOUTH EVERY 8 HOURS AS NEEDED FOR NAUSEA AND VOMITING Qty: 20 tablet, Refills: 0    Prenatal Vit-Fe Fumarate-FA (PRENATAL MULTIVITAMIN) TABS tablet Take 1 tablet by mouth daily at 12 noon.         Allergies  Sulfamethoxazole-trimethoprim  Review of Systems  Pertinent items noted in HPI and remainder of comprehensive ROS otherwise negative.  Physical Exam  BP 133/90   Pulse 64   Temp 98.4 F (36.9 C) (Oral)   Ht 5\' 9"  (1.753 m)   Wt 86 kg   LMP  (LMP Unknown)   BMI 28.00 kg/m   Lungs:  CTAB Cardio: RRR without M/R/G Abd: Soft, gravid, NT Presentation: cephalic  CERVIX: Dilation: 5 Effacement (%): 80 Station: -2 Presentation: Vertex Exam by:: D. Means, RN  See Prenatal records for more detailed PE.     FHR:  Baseline: 120 Variability: moderate Accelerations: present Decelerations: none Toco: regular, every 2 minutes Category 1  Test Results  No results found for this or any previous visit (from the past 24 hour(s)). Group B Strep negative, Rh negative  Assessment   G3P2002 at [redacted]w[redacted]d Estimated Date of  Delivery: 12/14/21  Reassuring maternal/fetal status.  Patient Active Problem List   Diagnosis Date Noted   Labor and delivery, indication for care 12/13/2021   Uterine contractions 12/06/2021   History of placenta abruption 07/27/2019   Panic attacks 07/27/2019   Family history of chromosomal abnormality 03/28/2015    Plan  1. Admit to L&D  2. EFM: Continuous -- Category 1 3. Pharmacologic pain relief if desired.   4. Admission labs  5. Anticipate NSVD 6. Dr. Valentino Saxon notified of admission  Guadlupe Spanish, [facility] 12/13/2021 6:43 AM

## 2021-12-13 NOTE — Anesthesia Procedure Notes (Signed)
Epidural Patient location during procedure: OB Start time: 12/13/2021 8:20 AM End time: 12/13/2021 8:23 AM  Staffing Anesthesiologist: Lenard Simmer, MD Performed: anesthesiologist   Preanesthetic Checklist Completed: patient identified, IV checked, site marked, risks and benefits discussed, surgical consent, monitors and equipment checked, pre-op evaluation and timeout performed  Epidural Patient position: sitting Prep: ChloraPrep Patient monitoring: heart rate, continuous pulse ox and blood pressure Approach: midline Location: L3-L4 Injection technique: LOR saline  Needle:  Needle type: Tuohy  Needle gauge: 17 G Needle length: 9 cm Needle insertion depth: 5 cm Catheter type: closed end flexible Catheter size: 19 Gauge Catheter at skin depth: 10 cm Test dose: negative and 1.5% lidocaine with Epi 1:200 K  Assessment Sensory level: T10 Events: blood not aspirated, injection not painful, no injection resistance, no paresthesia and negative IV test  Additional Notes 1st attempt Pt. Evaluated and documentation done after procedure finished. Patient identified. Risks/Benefits/Options discussed with patient including but not limited to bleeding, infection, nerve damage, paralysis, failed block, incomplete pain control, headache, blood pressure changes, nausea, vomiting, reactions to medication both or allergic, itching and postpartum back pain. Confirmed with bedside nurse the patient's most recent platelet count. Confirmed with patient that they are not currently taking any anticoagulation, have any bleeding history or any family history of bleeding disorders. Patient expressed understanding and wished to proceed. All questions were answered. Sterile technique was used throughout the entire procedure. Please see nursing notes for vital signs. Test dose was given through epidural catheter and negative prior to continuing to dose epidural or start infusion. Warning signs of high block  given to the patient including shortness of breath, tingling/numbness in hands, complete motor block, or any concerning symptoms with instructions to call for help. Patient was given instructions on fall risk and not to get out of bed. All questions and concerns addressed with instructions to call with any issues or inadequate analgesia.    Patient tolerated the insertion well without immediate complications.Reason for block:procedure for pain

## 2021-12-14 LAB — CBC
HCT: 28.6 % — ABNORMAL LOW (ref 36.0–46.0)
Hemoglobin: 9.5 g/dL — ABNORMAL LOW (ref 12.0–15.0)
MCH: 29.3 pg (ref 26.0–34.0)
MCHC: 33.2 g/dL (ref 30.0–36.0)
MCV: 88.3 fL (ref 80.0–100.0)
Platelets: 179 10*3/uL (ref 150–400)
RBC: 3.24 MIL/uL — ABNORMAL LOW (ref 3.87–5.11)
RDW: 12.9 % (ref 11.5–15.5)
WBC: 8.2 10*3/uL (ref 4.0–10.5)
nRBC: 0 % (ref 0.0–0.2)

## 2021-12-14 LAB — FETAL SCREEN: Fetal Screen: NEGATIVE

## 2021-12-14 LAB — RPR: RPR Ser Ql: NONREACTIVE

## 2021-12-14 MED ORDER — LABETALOL HCL 100 MG PO TABS
100.0000 mg | ORAL_TABLET | Freq: Two times a day (BID) | ORAL | Status: DC
  Filled 2021-12-14: qty 1

## 2021-12-14 MED ORDER — BENZOCAINE-MENTHOL 20-0.5 % EX AERO: 1.0000 | INHALATION_SPRAY | CUTANEOUS | Status: DC | PRN

## 2021-12-14 MED ORDER — IBUPROFEN 600 MG PO TABS: 600.0000 mg | ORAL_TABLET | Freq: Four times a day (QID) | ORAL | 0 refills | Status: DC

## 2021-12-14 MED ORDER — RHO D IMMUNE GLOBULIN 1500 UNIT/2ML IJ SOSY
300.0000 ug | PREFILLED_SYRINGE | Freq: Once | INTRAMUSCULAR | Status: AC
  Administered 2021-12-14: 300 ug via INTRAVENOUS
  Filled 2021-12-14: qty 2

## 2021-12-14 MED ORDER — LABETALOL HCL 100 MG PO TABS: 100.0000 mg | ORAL_TABLET | Freq: Two times a day (BID) | ORAL | 1 refills | Status: DC

## 2021-12-14 MED ORDER — WITCH HAZEL-GLYCERIN EX PADS: 1.0000 | MEDICATED_PAD | CUTANEOUS | 12 refills | Status: DC | PRN

## 2021-12-14 MED ORDER — ACETAMINOPHEN 325 MG PO TABS: 650.0000 mg | ORAL_TABLET | ORAL | Status: AC | PRN

## 2021-12-14 MED ORDER — DIBUCAINE (PERIANAL) 1 % EX OINT: 1.0000 | TOPICAL_OINTMENT | CUTANEOUS | Status: DC | PRN

## 2021-12-14 NOTE — Discharge Instructions (Signed)

## 2021-12-14 NOTE — Progress Notes (Addendum)
Discharge instructions given and reviewed with pt and family. Pt educated on follow up care, appointments and when to notify provider, questions invited and answered. ID bands matched with infant.  Pt and family noted understanding  to teaching/education.Pt escorted off unit by RN in wheelchair with infant safely in car seat on mothers lap. Infant secured in car by family.

## 2021-12-14 NOTE — Anesthesia Postprocedure Evaluation (Signed)
Anesthesia Post Note  Patient: Danielle Wood  Procedure(s) Performed: AN AD HOC LABOR EPIDURAL  Patient location during evaluation: Mother Baby Anesthesia Type: Epidural Level of consciousness: awake and alert Pain management: pain level controlled Vital Signs Assessment: post-procedure vital signs reviewed and stable Respiratory status: spontaneous breathing, nonlabored ventilation and respiratory function stable Cardiovascular status: stable Postop Assessment: no headache, no backache and epidural receding Anesthetic complications: no   No notable events documented.   Last Vitals:  Vitals:   12/14/21 0747 12/14/21 0946  BP: (!) 141/91 (!) 119/92  Pulse: 64   Resp: 20   Temp: 36.7 C   SpO2: 100%     Last Pain:  Vitals:   12/14/21 0815  TempSrc:   PainSc: 0-No pain                 Corinda Gubler

## 2021-12-15 LAB — RHOGAM INJECTION: Unit division: 0

## 2021-12-17 ENCOUNTER — Encounter: Payer: Medicaid Other | Admitting: Certified Nurse Midwife

## 2021-12-17 ENCOUNTER — Other Ambulatory Visit: Payer: Medicaid Other

## 2021-12-29 ENCOUNTER — Other Ambulatory Visit: Payer: Self-pay | Admitting: Obstetrics

## 2021-12-30 ENCOUNTER — Encounter: Payer: Self-pay | Admitting: Certified Nurse Midwife

## 2021-12-30 ENCOUNTER — Telehealth (INDEPENDENT_AMBULATORY_CARE_PROVIDER_SITE_OTHER): Payer: Medicaid Other | Admitting: Certified Nurse Midwife

## 2021-12-30 DIAGNOSIS — Z1331 Encounter for screening for depression: Secondary | ICD-10-CM

## 2021-12-30 DIAGNOSIS — Z1389 Encounter for screening for other disorder: Secondary | ICD-10-CM | POA: Diagnosis not present

## 2021-12-30 NOTE — Progress Notes (Signed)
Virtual Visit via Video Note  I connected with Danielle Wood on 12/30/21 at 11:30 AM EDT by a video enabled telemedicine application and verified that I am speaking with the correct person using two identifiers.  Location: Patient: at home Provider: at the office   I discussed the limitations of evaluation and management by telemedicine and the availability of in person appointments. The patient expressed understanding and agreed to proceed.  History of Present Illness: 31 yr old 2 wks post partum SVD 12/13/21    Observations/Objective: Pt state she is doing well over all. That she is having some mild cramping that she is not using medication to manage. She state her bleeding is declining. She has not been taking the labetalol as her Bps have been normal at home. She is pumping and denies any issue with that at this time. She states that her mood has been good over all. She feels like she has had some anxiety and stress due to fatigue and transitioning to new scheduled.   Assessment and Plan: EBPNS:10, state she feel like she is doing well, and denies need for andy changes to medication at this time. She will reach out to me if she decided that she is not managing well.   Follow Up Instructions: As scheduled in office for 6 wk visit or prn.    I discussed the assessment and treatment plan with the patient. The patient was provided an opportunity to ask questions and all were answered. The patient agreed with the plan and demonstrated an understanding of the instructions.   The patient was advised to call back or seek an in-person evaluation if the symptoms worsen or if the condition fails to improve as anticipated.  I provided 10 minutes of non-face-to-face time during this encounter.   Doreene Burke, CNM

## 2022-01-25 ENCOUNTER — Other Ambulatory Visit: Payer: Self-pay | Admitting: Obstetrics

## 2022-02-11 ENCOUNTER — Encounter: Payer: Self-pay | Admitting: Certified Nurse Midwife

## 2022-02-27 ENCOUNTER — Other Ambulatory Visit: Payer: Self-pay | Admitting: Obstetrics

## 2022-04-29 ENCOUNTER — Encounter: Payer: Self-pay | Admitting: Certified Nurse Midwife

## 2022-09-07 ENCOUNTER — Encounter: Payer: Self-pay | Admitting: Certified Nurse Midwife

## 2022-09-09 ENCOUNTER — Other Ambulatory Visit: Payer: Self-pay | Admitting: Certified Nurse Midwife

## 2022-09-09 MED ORDER — CITALOPRAM HYDROBROMIDE 40 MG PO TABS
40.0000 mg | ORAL_TABLET | Freq: Every day | ORAL | 12 refills | Status: DC
Start: 2022-09-09 — End: 2023-01-30

## 2022-11-26 ENCOUNTER — Telehealth: Payer: Self-pay

## 2022-11-26 ENCOUNTER — Ambulatory Visit (INDEPENDENT_AMBULATORY_CARE_PROVIDER_SITE_OTHER): Payer: Medicaid Other

## 2022-11-26 VITALS — BP 110/50 | HR 78 | Ht 69.0 in | Wt 175.6 lb

## 2022-11-26 DIAGNOSIS — Z3201 Encounter for pregnancy test, result positive: Secondary | ICD-10-CM

## 2022-11-26 DIAGNOSIS — N912 Amenorrhea, unspecified: Secondary | ICD-10-CM

## 2022-11-26 LAB — POCT URINE PREGNANCY: Preg Test, Ur: POSITIVE — AB

## 2022-11-26 NOTE — Patient Instructions (Signed)
Congratulations!!!!!   First Trimester of Pregnancy  The first trimester of pregnancy starts on the first day of your last menstrual period until the end of week 12. This is also called months 1 through 3 of pregnancy. Body changes during your first trimester Your body goes through many changes during pregnancy. The changes usually return to normal after your baby is born. Physical changes You may gain or lose weight. Your breasts may grow larger and hurt. The area around your nipples may get darker. Dark spots or blotches may develop on your face. You may have changes in your hair. Health changes You may feel like you might vomit (nauseous), and you may vomit. You may have heartburn. You may have headaches. You may have trouble pooping (constipation). Your gums may bleed. Other changes You may get tired easily. You may pee (urinate) more often. Your menstrual periods will stop. You may not feel hungry. You may want to eat certain kinds of food. You may have changes in your emotions from day to day. You may have more dreams. Follow these instructions at home: Medicines Take over-the-counter and prescription medicines only as told by your doctor. Some medicines are not safe during pregnancy. Take a prenatal vitamin that contains at least 600 micrograms (mcg) of folic acid. Eating and drinking Eat healthy meals that include: Fresh fruits and vegetables. Whole grains. Good sources of protein, such as meat, eggs, or tofu. Low-fat dairy products. Avoid raw meat and unpasteurized juice, milk, and cheese. If you feel like you may vomit, or you vomit: Eat 4 or 5 small meals a day instead of 3 large meals. Try eating a few soda crackers. Drink liquids between meals instead of during meals. You may need to take these actions to prevent or treat trouble pooping: Drink enough fluids to keep your pee (urine) pale yellow. Eat foods that are high in fiber. These include beans, whole  grains, and fresh fruits and vegetables. Limit foods that are high in fat and sugar. These include fried or sweet foods. Activity Exercise only as told by your doctor. Most people can do their usual exercise routine during pregnancy. Stop exercising if you have cramps or pain in your lower belly (abdomen) or low back. Do not exercise if it is too hot or too humid, or if you are in a place of great height (high altitude). Avoid heavy lifting. If you choose to, you may have sex unless your doctor tells you not to. Relieving pain and discomfort Wear a good support bra if your breasts are sore. Rest with your legs raised (elevated) if you have leg cramps or low back pain. If you have bulging veins (varicose veins) in your legs: Wear support hose as told by your doctor. Raise your feet for 15 minutes, 3-4 times a day. Limit salt in your food. Safety Wear your seat belt at all times when you are in a car. Talk with your doctor if someone is hurting you or yelling at you. Talk with your doctor if you are feeling sad or have thoughts of hurting yourself. Lifestyle Do not use hot tubs, steam rooms, or saunas. Do not douche. Do not use tampons or scented sanitary pads. Do not use herbal medicines, illegal drugs, or medicines that are not approved by your doctor. Do not drink alcohol. Do not smoke or use any products that contain nicotine or tobacco. If you need help quitting, ask your doctor. Avoid cat litter boxes and soil that is used by  cats. These carry germs that can cause harm to the baby and can cause a loss of your baby by miscarriage or stillbirth. General instructions Keep all follow-up visits. This is important. Ask for help if you need counseling or if you need help with nutrition. Your doctor can give you advice or tell you where to go for help. Visit your dentist. At home, brush your teeth with a soft toothbrush. Floss gently. Write down your questions. Take them to your prenatal  visits. Where to find more information American Pregnancy Association: americanpregnancy.org [employer]: www.acog.org Office on Women's Health: [employer].nz Contact a doctor if: You are dizzy. You have a fever. You have mild cramps or pressure in your lower belly. You have a nagging pain in your belly area. You continue to feel like you may vomit, you vomit, or you have watery poop (diarrhea) for 24 hours or longer. You have a bad-smelling fluid coming from your vagina. You have pain when you pee. You are exposed to a disease that spreads from person to person, such as chickenpox, measles, Zika virus, HIV, or hepatitis. Get help right away if: You have spotting or bleeding from your vagina. You have very bad belly cramping or pain. You have shortness of breath or chest pain. You have any kind of injury, such as from a fall or a car crash. You have new or increased pain, swelling, or redness in an arm or leg. Summary The first trimester of pregnancy starts on the first day of your last menstrual period until the end of week 12 (months 1 through 3). Eat 4 or 5 small meals a day instead of 3 large meals. Do not smoke or use any products that contain nicotine or tobacco. If you need help quitting, ask your doctor. Keep all follow-up visits. This information is not intended to replace advice given to you by your health care provider. Make sure you discuss any questions you have with your health care provider. Document Revised: 10/11/2019 Document Reviewed: 08/17/2019 Elsevier Patient Education  2024 [employer].

## 2022-11-26 NOTE — Progress Notes (Signed)
    NURSE VISIT NOTE  Subjective:    Patient ID: Danielle Wood, female    DOB: 1991/02/09, 32 y.o.   MRN: [id number]  HPI  Patient is a 32 y.o. G17P3003 female who presents for evaluation of amenorrhea. She believes she could be pregnant. Pregnancy is desired. Sexual Activity: single partner, contraception: none. Current symptoms also include: fatigue, nausea, and positive home pregnancy test. Last period was normal, patient states that she was breast feeding and states that her last cycle was sometime around March or May and last 3 days.     Objective:    BP (!) 110/50   Pulse 78   Ht 5\' 9"  (1.753 m)   Wt 175 lb 9.6 oz (79.7 kg)   LMP  (LMP Unknown)   Breastfeeding No   BMI 25.93 kg/m   Lab Review  Results for orders placed or performed in visit on 11/26/22  POCT urine pregnancy  Result Value Ref Range   Preg Test, Ur Positive (A) Negative    Assessment:   1. Absence of menstruation     Plan:   Pregnancy Test: Positive  Encouraged well-balanced diet, plenty of rest when needed, pre-natal vitamins daily and walking for exercise.  Discussed self-help for nausea, avoiding OTC medications until consulting provider or pharmacist, other than Tylenol as needed, minimal caffeine (1-2 cups daily) and avoiding alcohol.   She will schedule her nurse visit @ [redacted] wks pregnant, u/s for dating and labs @10  wk, and NOB visit at [redacted] wk pregnant.    Feel free to call with any questions.  Patient will check my chart for results.   Fonda Kinder, CMA

## 2022-11-26 NOTE — Telephone Encounter (Signed)
Patient was seen in office today for nurse visit to confirm pregnancy. Patient is requesting prescription for Zofran to control symptoms of nausea. I provided patient a handout of safe medications to take during pregnancy and advised that she try taking otc Vitamin b6  and or Unisom. Patient request that I send request to provider to review. KW

## 2022-11-27 ENCOUNTER — Other Ambulatory Visit: Payer: Self-pay | Admitting: Licensed Practical Nurse

## 2022-11-27 DIAGNOSIS — O219 Vomiting of pregnancy, unspecified: Secondary | ICD-10-CM

## 2022-11-27 MED ORDER — ONDANSETRON HCL 4 MG PO TABS: 4.0000 mg | ORAL_TABLET | Freq: Every day | ORAL | 1 refills | Status: DC | PRN

## 2022-11-27 NOTE — Progress Notes (Signed)
Pt requesting Zofran for N/V Script sent Jannifer Hick   [facility]  11/27/22  1:27 PM

## 2022-12-11 ENCOUNTER — Ambulatory Visit (INDEPENDENT_AMBULATORY_CARE_PROVIDER_SITE_OTHER): Payer: Medicaid Other

## 2022-12-11 DIAGNOSIS — Z3480 Encounter for supervision of other normal pregnancy, unspecified trimester: Secondary | ICD-10-CM | POA: Insufficient documentation

## 2022-12-11 DIAGNOSIS — Z3689 Encounter for other specified antenatal screening: Secondary | ICD-10-CM

## 2022-12-11 NOTE — Progress Notes (Signed)
New OB Intake  I connected with  Danielle Wood on 12/11/22 at 11:15 AM EDT by telephone and verified that I am speaking with the correct person using two identifiers. Nurse is located at [facility] and pt is located at home.  I explained I am completing New OB Intake today. We discussed her EDD of unknown that is based on LMP of unknown. Pt is G4/P3003. I reviewed her allergies, medications, Medical/Surgical/OB history, and appropriate screenings. There are no cats in the home. Based on history, this is a/an pregnancy uncomplicated .   Patient Active Problem List   Diagnosis Date Noted   Supervision of other normal pregnancy, antepartum 12/11/2022   Postpartum care following vaginal delivery 12/13/2021   Panic attacks 07/27/2019   Family history of chromosomal abnormality 03/28/2015    Concerns addressed today None  Delivery Plans:  Plans to deliver at [facility].  Anatomy US Explained first scheduled [place] will be Aug 29th and an anatomy scan will be done at 20 weeks.  Labs Discussed genetic screening with patient. Patient desires genetic testing to be drawn at new OB visit. Discussed possible labs to be drawn at new OB appointment.  COVID Vaccine Patient has not had COVID vaccine.   Social Determinants of Health Food Insecurity: denies food insecurity Transportation: Patient denies transportation needs. Childcare: Discussed no children allowed at ultrasound appointments.   First visit review I reviewed new OB appt with pt. I explained she will have ob bloodwork and pap smear/pelvic exam if indicated. Explained pt will be seen by an AOB provider at first visit; encounter routed to appropriate provider.   Loran Senters, [facility] 12/11/2022  11:42 AM

## 2022-12-11 NOTE — Patient Instructions (Signed)
First Trimester of Pregnancy  The first trimester of pregnancy starts on the first day of your last menstrual period until the end of week 12. This is also called months 1 through 3 of pregnancy. Body changes during your first trimester Your body goes through many changes during pregnancy. The changes usually return to normal after your baby is born. Physical changes You may gain or lose weight. Your breasts may grow larger and hurt. The area around your nipples may get darker. Dark spots or blotches may develop on your face. You may have changes in your hair. Health changes You may feel like you might vomit (nauseous), and you may vomit. You may have heartburn. You may have headaches. You may have trouble pooping (constipation). Your gums may bleed. Other changes You may get tired easily. You may pee (urinate) more often. Your menstrual periods will stop. You may not feel hungry. You may want to eat certain kinds of food. You may have changes in your emotions from day to day. You may have more dreams. Follow these instructions at home: Medicines Take over-the-counter and prescription medicines only as told by your doctor. Some medicines are not safe during pregnancy. Take a prenatal vitamin that contains at least 600 micrograms (mcg) of folic acid. Eating and drinking Eat healthy meals that include: Fresh fruits and vegetables. Whole grains. Good sources of protein, such as meat, eggs, or tofu. Low-fat dairy products. Avoid raw meat and unpasteurized juice, milk, and cheese. If you feel like you may vomit, or you vomit: Eat 4 or 5 small meals a day instead of 3 large meals. Try eating a few soda crackers. Drink liquids between meals instead of during meals. You may need to take these actions to prevent or treat trouble pooping: Drink enough fluids to keep your pee (urine) pale yellow. Eat foods that are high in fiber. These include beans, whole grains, and fresh fruits and  vegetables. Limit foods that are high in fat and sugar. These include fried or sweet foods. Activity Exercise only as told by your doctor. Most people can do their usual exercise routine during pregnancy. Stop exercising if you have cramps or pain in your lower belly (abdomen) or low back. Do not exercise if it is too hot or too humid, or if you are in a place of great height (high altitude). Avoid heavy lifting. If you choose to, you may have sex unless your doctor tells you not to. Relieving pain and discomfort Wear a good support bra if your breasts are sore. Rest with your legs raised (elevated) if you have leg cramps or low back pain. If you have bulging veins (varicose veins) in your legs: Wear support hose as told by your doctor. Raise your feet for 15 minutes, 3-4 times a day. Limit salt in your food. Safety Wear your seat belt at all times when you are in a car. Talk with your doctor if someone is hurting you or yelling at you. Talk with your doctor if you are feeling sad or have thoughts of hurting yourself. Lifestyle Do not use hot tubs, steam rooms, or saunas. Do not douche. Do not use tampons or scented sanitary pads. Do not use herbal medicines, illegal drugs, or medicines that are not approved by your doctor. Do not drink alcohol. Do not smoke or use any products that contain nicotine or tobacco. If you need help quitting, ask your doctor. Avoid cat litter boxes and soil that is used by cats. These carry   germs that can cause harm to the baby and can cause a loss of your baby by miscarriage or stillbirth. General instructions Keep all follow-up visits. This is important. Ask for help if you need counseling or if you need help with nutrition. Your doctor can give you advice or tell you where to go for help. Visit your dentist. At home, brush your teeth with a soft toothbrush. Floss gently. Write down your questions. Take them to your prenatal visits. Where to find more  information American Pregnancy Association: americanpregnancy.org American College of Obstetricians and Gynecologists: www.acog.org Office on Women's Health: womenshealth.gov/pregnancy Contact a doctor if: You are dizzy. You have a fever. You have mild cramps or pressure in your lower belly. You have a nagging pain in your belly area. You continue to feel like you may vomit, you vomit, or you have watery poop (diarrhea) for 24 hours or longer. You have a bad-smelling fluid coming from your vagina. You have pain when you pee. You are exposed to a disease that spreads from person to person, such as chickenpox, measles, Zika virus, HIV, or hepatitis. Get help right away if: You have spotting or bleeding from your vagina. You have very bad belly cramping or pain. You have shortness of breath or chest pain. You have any kind of injury, such as from a fall or a car crash. You have new or increased pain, swelling, or redness in an arm or leg. Summary The first trimester of pregnancy starts on the first day of your last menstrual period until the end of week 12 (months 1 through 3). Eat 4 or 5 small meals a day instead of 3 large meals. Do not smoke or use any products that contain nicotine or tobacco. If you need help quitting, ask your doctor. Keep all follow-up visits. This information is not intended to replace advice given to you by your health care provider. Make sure you discuss any questions you have with your health care provider. Document Revised: 10/11/2019 Document Reviewed: 08/17/2019 Elsevier Patient Education  2024 Elsevier Inc. Commonly Asked Questions During Pregnancy  Cats: A parasite can be excreted in cat feces.  To avoid exposure you need to have another person empty the little box.  If you must empty the litter box you will need to wear gloves.  Wash your hands after handling your cat.  This parasite can also be found in raw or undercooked meat so this should also be  avoided.  Colds, Sore Throats, Flu: Please check your medication sheet to see what you can take for symptoms.  If your symptoms are unrelieved by these medications please call the office.  Dental Work: Most any dental work your dentist recommends is permitted.  X-rays should only be taken during the first trimester if absolutely necessary.  Your abdomen should be shielded with a lead apron during all x-rays.  Please notify your provider prior to receiving any x-rays.  Novocaine is fine; gas is not recommended.  If your dentist requires a note from us prior to dental work please call the office and we will provide one for you.  Exercise: Exercise is an important part of staying healthy during your pregnancy.  You may continue most exercises you were accustomed to prior to pregnancy.  Later in your pregnancy you will most likely notice you have difficulty with activities requiring balance like riding a bicycle.  It is important that you listen to your body and avoid activities that put you at a higher   risk of falling.  Adequate rest and staying well hydrated are a must!  If you have questions about the safety of specific activities ask your provider.    Exposure to Children with illness: Try to avoid obvious exposure; report any symptoms to us when noted,  If you have chicken pos, red measles or mumps, you should be immune to these diseases.   Please do not take any vaccines while pregnant unless you have checked with your OB provider.  Fetal Movement: After 28 weeks we recommend you do "kick counts" twice daily.  Lie or sit down in a calm quiet environment and count your baby movements "kicks".  You should feel your baby at least 10 times per hour.  If you have not felt 10 kicks within the first hour get up, walk around and have something sweet to eat or drink then repeat for an additional hour.  If count remains less than 10 per hour notify your provider.  Fumigating: Follow your pest control agent's  advice as to how long to stay out of your home.  Ventilate the area well before re-entering.  Hemorrhoids:   Most over-the-counter preparations can be used during pregnancy.  Check your medication to see what is safe to use.  It is important to use a stool softener or fiber in your diet and to drink lots of liquids.  If hemorrhoids seem to be getting worse please call the office.   Hot Tubs:  Hot tubs Jacuzzis and saunas are not recommended while pregnant.  These increase your internal body temperature and should be avoided.  Intercourse:  Sexual intercourse is safe during pregnancy as long as you are comfortable, unless otherwise advised by your provider.  Spotting may occur after intercourse; report any bright red bleeding that is heavier than spotting.  Labor:  If you know that you are in labor, please go to the hospital.  If you are unsure, please call the office and let us help you decide what to do.  Lifting, straining, etc:  If your job requires heavy lifting or straining please check with your provider for any limitations.  Generally, you should not lift items heavier than that you can lift simply with your hands and arms (no back muscles)  Painting:  Paint fumes do not harm your pregnancy, but may make you ill and should be avoided if possible.  Latex or water based paints have less odor than oils.  Use adequate ventilation while painting.  Permanents & Hair Color:  Chemicals in hair dyes are not recommended as they cause increase hair dryness which can increase hair loss during pregnancy.  " Highlighting" and permanents are allowed.  Dye may be absorbed differently and permanents may not hold as well during pregnancy.  Sunbathing:  Use a sunscreen, as skin burns easily during pregnancy.  Drink plenty of fluids; avoid over heating.  Tanning Beds:  Because their possible side effects are still unknown, tanning beds are not recommended.  Ultrasound Scans:  Routine ultrasounds are performed  at approximately 20 weeks.  You will be able to see your baby's general anatomy an if you would like to know the gender this can usually be determined as well.  If it is questionable when you conceived you may also receive an ultrasound early in your pregnancy for dating purposes.  Otherwise ultrasound exams are not routinely performed unless there is a medical necessity.  Although you can request a scan we ask that you pay for it when   conducted because insurance does not cover " patient request" scans.  Work: If your pregnancy proceeds without complications you may work until your due date, unless your physician or employer advises otherwise.  Round Ligament Pain/Pelvic Discomfort:  Sharp, shooting pains not associated with bleeding are fairly common, usually occurring in the second trimester of pregnancy.  They tend to be worse when standing up or when you remain standing for long periods of time.  These are the result of pressure of certain pelvic ligaments called "round ligaments".  Rest, Tylenol and heat seem to be the most effective relief.  As the womb and fetus grow, they rise out of the pelvis and the discomfort improves.  Please notify the office if your pain seems different than that described.  It may represent a more serious condition.  Common Medications Safe in Pregnancy  Acne:      Constipation:  Benzoyl Peroxide     Colace  Clindamycin      Dulcolax Suppository  Topica Erythromycin     Fibercon  Salicylic Acid      Metamucil         Miralax AVOID:        Senakot   Accutane    Cough:  Retin-A       Cough Drops  Tetracycline      Phenergan w/ Codeine if Rx  Minocycline      Robitussin (Plain & DM)  Antibiotics:     Crabs/Lice:  Ceclor       RID  Cephalosporins    AVOID:  E-Mycins      Kwell  Keflex  Macrobid/Macrodantin   Diarrhea:  Penicillin      Kao-Pectate  Zithromax      Imodium AD         PUSH FLUIDS AVOID:       Cipro     Fever:  Tetracycline      Tylenol (Regular  or Extra  Minocycline       Strength)  Levaquin      Extra Strength-Do not          Exceed 8 tabs/24 hrs Caffeine:        <200mg/day (equiv. To 1 cup of coffee or  approx. 3 12 oz sodas)         Gas: Cold/Hayfever:       Gas-X  Benadryl      Mylicon  Claritin       Phazyme  **Claritin-D        Chlor-Trimeton    Headaches:  Dimetapp      ASA-Free Excedrin  Drixoral-Non-Drowsy     Cold Compress  Mucinex (Guaifenasin)     Tylenol (Regular or Extra  Sudafed/Sudafed-12 Hour     Strength)  **Sudafed PE Pseudoephedrine   Tylenol Cold & Sinus     Vicks Vapor Rub  Zyrtec  **AVOID if Problems With Blood Pressure         Heartburn: Avoid lying down for at least 1 hour after meals  Aciphex      Maalox     Rash:  Milk of Magnesia     Benadryl    Mylanta       1% Hydrocortisone Cream  Pepcid  Pepcid Complete   Sleep Aids:  Prevacid      Ambien   Prilosec       Benadryl  Rolaids       Chamomile Tea  Tums (Limit 4/day)     Unisom           Tylenol PM         Warm milk-add vanilla or  Hemorrhoids:       Sugar for taste  Anusol/Anusol H.C.  (RX: Analapram 2.5%)  Sugar Substitutes:  Hydrocortisone OTC     Ok in moderation  Preparation H      Tucks        Vaseline lotion applied to tissue with wiping    Herpes:     Throat:  Acyclovir      Oragel  Famvir  Valtrex     Vaccines:         Flu Shot Leg Cramps:       *Gardasil  Benadryl      Hepatitis A         Hepatitis B Nasal Spray:       Pneumovax  Saline Nasal Spray     Polio Booster         Tetanus Nausea:       Tuberculosis test or PPD  Vitamin B6 25 mg TID   AVOID:    Dramamine      *Gardasil  Emetrol       Live Poliovirus  Ginger Root 250 mg QID    MMR (measles, mumps &  High Complex Carbs @ Bedtime    rebella)  Sea Bands-Accupressure    Varicella (Chickenpox)  Unisom 1/2 tab TID     *No known complications           If received before Pain:         Known pregnancy;   Darvocet       Resume series  after  Lortab        Delivery  Percocet    Yeast:   Tramadol      Femstat  Tylenol 3      Gyne-lotrimin  Ultram       Monistat  Vicodin           MISC:         All Sunscreens           Hair Coloring/highlights          Insect Repellant's          (Including DEET)         Mystic Tans  

## 2022-12-14 ENCOUNTER — Ambulatory Visit (INDEPENDENT_AMBULATORY_CARE_PROVIDER_SITE_OTHER): Payer: Medicaid Other

## 2022-12-14 ENCOUNTER — Other Ambulatory Visit: Payer: Self-pay | Admitting: Licensed Practical Nurse

## 2022-12-14 DIAGNOSIS — N912 Amenorrhea, unspecified: Secondary | ICD-10-CM

## 2022-12-14 DIAGNOSIS — Z3A10 10 weeks gestation of pregnancy: Secondary | ICD-10-CM | POA: Diagnosis not present

## 2022-12-14 DIAGNOSIS — Z3687 Encounter for antenatal screening for uncertain dates: Secondary | ICD-10-CM

## 2022-12-28 ENCOUNTER — Encounter: Payer: Medicaid Other | Admitting: Obstetrics

## 2023-01-04 ENCOUNTER — Other Ambulatory Visit (HOSPITAL_COMMUNITY)
Admission: RE | Admit: 2023-01-04 | Discharge: 2023-01-04 | Disposition: A | Discharge status: Home / Self Care | Payer: Medicaid Other | Source: Ambulatory Visit | Attending: Obstetrics | Admitting: Obstetrics

## 2023-01-04 ENCOUNTER — Ambulatory Visit (INDEPENDENT_AMBULATORY_CARE_PROVIDER_SITE_OTHER): Payer: Medicaid Other | Admitting: Certified Nurse Midwife

## 2023-01-04 VITALS — BP 115/72 | HR 74 | Wt 175.2 lb

## 2023-01-04 DIAGNOSIS — Z3A13 13 weeks gestation of pregnancy: Secondary | ICD-10-CM | POA: Diagnosis not present

## 2023-01-04 DIAGNOSIS — Z3481 Encounter for supervision of other normal pregnancy, first trimester: Secondary | ICD-10-CM | POA: Diagnosis present

## 2023-01-04 DIAGNOSIS — Z113 Encounter for screening for infections with a predominantly sexual mode of transmission: Secondary | ICD-10-CM | POA: Diagnosis present

## 2023-01-04 NOTE — Progress Notes (Signed)
NEW OB HISTORY AND PHYSICAL  SUBJECTIVE:       Danielle Wood is a 32 y.o. [id number] female, No LMP recorded (lmp unknown). Patient is pregnant., Estimated Date of Delivery: 07/06/23, [redacted]w[redacted]d, presents today for establishment of Prenatal Care. 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 Patient is pregnant. Normal Contraception: none Last Pap: 05/21/2021. Results were: normal/ neg HPV  Obstetric History OB History  Gravida Para Term Preterm AB Living  4 3 3  0 0 3  SAB IAB Ectopic Multiple Live Births  0 0 0 0 3    # Outcome Date GA Lbr Len/2nd Weight Sex Type Anes PTL Lv  4 Current           3 Term 12/13/21 [redacted]w[redacted]d / 00:13 9 lb 8 oz (4.31 kg) F Vag-Spont EPI  LIV  2 Term 11/07/19 [redacted]w[redacted]d   M Vag-Spont  N LIV  1 Term 10/03/15 [redacted]w[redacted]d  6 lb 10.9 oz (3.03 kg) F Vag-Spont EPI N LIV    Past Medical History:  Diagnosis Date   Anxiety     Past Surgical History:  Procedure Laterality Date   TONSILLECTOMY AND ADENOIDECTOMY N/A 01/14/2016   Procedure: TONSILLECTOMY AND ADENOIDECTOMY;  Surgeon: Linus Salmons, MD;  Location: [facility] ORS;  Service: ENT;  Laterality: N/A;   WISDOM TOOTH EXTRACTION  2010    Current Outpatient Medications on File Prior to Visit  Medication Sig Dispense Refill   acetaminophen (TYLENOL) 325 MG tablet Take 2 tablets (650 mg total) by mouth every 4 (four) hours as needed (for pain scale < 4).     ALPRAZolam (XANAX) 0.5 MG tablet Take 0.5 mg by mouth 2 (two) times daily as needed for anxiety.     citalopram (CELEXA) 40 MG tablet Take 1 tablet (40 mg total) by mouth daily. 30 tablet 12   ondansetron (ZOFRAN) 4 MG tablet Take 1 tablet (4 mg total) by mouth daily as needed for nausea or vomiting. 30 tablet 1   Prenatal Vit-Fe Fumarate-FA (PRENATAL  MULTIVITAMIN) TABS tablet Take 1 tablet by mouth daily at 12 noon.     benzocaine-Menthol (DERMOPLAST) 20-0.5 % AERO Apply 1 Application topically as needed for irritation (perineal discomfort). (Patient not taking: Reported on 12/11/2022)     dibucaine (NUPERCAINAL) 1 % OINT Place 1 Application rectally as needed for hemorrhoids. (Patient not taking: Reported on 12/11/2022)     witch hazel-glycerin (TUCKS) pad Apply 1 Application topically as needed for hemorrhoids. (Patient not taking: Reported on 12/11/2022) 40 each 12   No current facility-administered medications on file prior to visit.    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  Tobacco Use   Smoking status: Never   Smokeless tobacco: Never  Vaping Use   Vaping status: Never Used  Substance and Sexual Activity   Alcohol use: No   Drug use: No   Sexual activity: Yes    Partners: Male    Birth  control/protection: None  Other Topics Concern   Not on file  Social History Narrative   ** Merged History Encounter **       Social Determinants of Health   Financial Resource Strain: Low Risk  (12/11/2022)   Overall Financial Resource Strain (CARDIA)    Difficulty of Paying Living Expenses: Not hard at all  Food Insecurity: No Food Insecurity (12/11/2022)   Hunger Vital Sign    Worried About Running Out of Food in the Last Year: Never true    Ran Out of Food in the Last Year: Never true  Transportation Needs: No Transportation Needs (12/11/2022)   PRAPARE - Administrator, Civil Service (Medical): No    Lack of Transportation (Non-Medical): No  Physical Activity: Sufficiently Active  (12/11/2022)   Exercise Vital Sign    Days of Exercise per Week: 7 days    Minutes of Exercise per Session: 30 min  Stress: Stress Concern Present (12/11/2022)   [employer] of Occupational Health - Occupational Stress Questionnaire    Feeling of Stress : To some extent  Social Connections: Socially Integrated (12/11/2022)   Social Connection and Isolation Panel [NHANES]    Frequency of Communication with Friends and Family: More than three times a week    Frequency of Social Gatherings with Friends and Family: More than three times a week    Attends Religious Services: More than 4 times per year    Active Member of [employer] or Organizations: Yes    Attends Engineer, structural: More than 4 times per year    Marital Status: Married  Catering manager Violence: Not At Risk (12/11/2022)   Humiliation, Afraid, Rape, and Kick questionnaire    Fear of Current or Ex-Partner: No    Emotionally Abused: No    Physically Abused: No    Sexually Abused: No    Family History  Problem Relation Age of Onset   Healthy Mother    Hypertension Father    Atrial fibrillation Father    Down syndrome Brother        died day after birth   Healthy Brother    COPD Maternal Grandmother    Atrial fibrillation Maternal Grandmother    Supraventricular tachycardia Maternal Grandmother    Heart failure Maternal Grandfather    Cancer Paternal Grandmother 59       breast/matastasized   Breast cancer Paternal Grandmother    Healthy Paternal Grandfather     The following portions of the patient's history were reviewed and updated as appropriate: allergies, current medications, past OB history, past medical history, past surgical history, past family history, past social history, and problem list.    OBJECTIVE: Initial Physical Exam (New OB)  GENERAL APPEARANCE: alert, well appearing, in no apparent distress, oriented to person, place and time HEAD: normocephalic, atraumatic MOUTH: mucous  membranes moist, pharynx normal without lesions THYROID: no thyromegaly or masses present BREASTS: no masses noted, no significant tenderness, no palpable axillary nodes, no skin changes LUNGS: clear to auscultation, no wheezes, rales or rhonchi, symmetric air entry HEART: regular rate and rhythm, no murmurs ABDOMEN: soft, nontender, nondistended, no abnormal masses, no epigastric pain and FHT present EXTREMITIES: no redness or tenderness in the calves or thighs, no edema, no limitation in range of motion SKIN: normal coloration and turgor, no rashes LYMPH NODES: no adenopathy palpable NEUROLOGIC: alert, oriented, normal speech, no focal findings or movement disorder noted  PELVIC EXAM deferred not due for pap, has tested pelvis  ASSESSMENT: Normal pregnancy  PLAN: Prenatal care See ordersNew OB counseling: The patient has been given an overview regarding routine prenatal care. Recommendations regarding diet, weight gain, and exercise in pregnancy were given. Prenatal testing, optional genetic testing,carrier screening,  and ultrasound use in pregnancy were reviewed.  Discussed medication use in pregnancy. She has used celexa and xanax with previous pregnancy and has been counseled in the past on risks and benefits. She verbalizes understanding.  Benefits of Breast Feeding were discussed. She has pumped breast milk with last baby . The patient is  encouraged to consider nursing her baby post partum.  Doreene Burke, CNM

## 2023-01-05 ENCOUNTER — Encounter: Payer: Self-pay | Admitting: Certified Nurse Midwife

## 2023-01-05 ENCOUNTER — Other Ambulatory Visit: Payer: Self-pay | Admitting: Certified Nurse Midwife

## 2023-01-05 LAB — URINALYSIS, ROUTINE W REFLEX MICROSCOPIC
Bilirubin, UA: NEGATIVE
Glucose, UA: NEGATIVE
Ketones, UA: NEGATIVE
Nitrite, UA: NEGATIVE
RBC, UA: NEGATIVE
Specific Gravity, UA: >=1.03 — AB (ref 1.005–1.030)
Urobilinogen, Ur: 0.2 mg/dL (ref 0.2–1.0)
pH, UA: 5.5 (ref 5.0–7.5)

## 2023-01-05 LAB — URINE CYTOLOGY ANCILLARY ONLY
Chlamydia: NEGATIVE
Comment: NEGATIVE
Comment: NORMAL
Neisseria Gonorrhea: NEGATIVE

## 2023-01-05 LAB — CBC/D/PLT+RPR+RH+ABO+RUBIGG...
Antibody Screen: NEGATIVE
Basophils Absolute: 0 10*3/uL (ref 0.0–0.2)
Basos: 0 %
EOS (ABSOLUTE): 0.1 10*3/uL (ref 0.0–0.4)
Eos: 1 %
HCV Ab: NONREACTIVE
HIV Screen 4th Generation wRfx: NONREACTIVE
Hematocrit: 34.9 % (ref 34.0–46.6)
Hemoglobin: 11.9 g/dL (ref 11.1–15.9)
Hepatitis B Surface Ag: NEGATIVE
Immature Grans (Abs): 0 10*3/uL (ref 0.0–0.1)
Immature Granulocytes: 0 %
Lymphocytes Absolute: 0.8 10*3/uL (ref 0.7–3.1)
Lymphs: 13 %
MCH: 30.7 pg (ref 26.6–33.0)
MCHC: 34.1 g/dL (ref 31.5–35.7)
MCV: 90 fL (ref 79–97)
Monocytes Absolute: 0.3 10*3/uL (ref 0.1–0.9)
Monocytes: 5 %
Neutrophils Absolute: 5.1 10*3/uL (ref 1.4–7.0)
Neutrophils: 81 %
Platelets: 195 10*3/uL (ref 150–450)
RBC: 3.88 x10E6/uL (ref 3.77–5.28)
RDW: 13.1 % (ref 11.7–15.4)
RPR Ser Ql: NONREACTIVE
Rh Factor: NEGATIVE
Rubella Antibodies, IGG: 6.2 {index} (ref >0.99)
Varicella zoster IgG: 255 {index} (ref >165)
WBC: 6.3 10*3/uL (ref 3.4–10.8)

## 2023-01-05 LAB — MICROSCOPIC EXAMINATION
Casts: NONE SEEN /LPF
Epithelial Cells (non renal): >10 /HPF — AB (ref 0–10)

## 2023-01-05 LAB — HCV INTERPRETATION

## 2023-01-05 MED ORDER — FUSION PLUS PO CAPS: 1.0000 | ORAL_CAPSULE | Freq: Every day | ORAL | 11 refills | Status: AC

## 2023-01-06 LAB — MONITOR DRUG PROFILE 14(MW)
Amphetamine Scrn, Ur: NEGATIVE ng/mL
BARBITURATE SCREEN URINE: NEGATIVE ng/mL
BENZODIAZEPINE SCREEN, URINE: NEGATIVE ng/mL
Buprenorphine, Urine: NEGATIVE ng/mL
CANNABINOIDS UR QL SCN: NEGATIVE ng/mL
Cocaine (Metab) Scrn, Ur: NEGATIVE ng/mL
Creatinine(Crt), U: 208.9 mg/dL (ref 20.0–300.0)
Fentanyl, Urine: NEGATIVE pg/mL
Meperidine Screen, Urine: NEGATIVE ng/mL
Methadone Screen, Urine: NEGATIVE ng/mL
OXYCODONE+OXYMORPHONE UR QL SCN: NEGATIVE ng/mL
Opiate Scrn, Ur: NEGATIVE ng/mL
Ph of Urine: 5.4 (ref 4.5–8.9)
Phencyclidine Qn, Ur: NEGATIVE ng/mL
Propoxyphene Scrn, Ur: NEGATIVE ng/mL
SPECIFIC GRAVITY: 1.035
Tramadol Screen, Urine: NEGATIVE ng/mL

## 2023-01-06 LAB — NICOTINE SCREEN, URINE: Cotinine Ql Scrn, Ur: NEGATIVE ng/mL

## 2023-01-09 LAB — MATERNIT 21 PLUS CORE, BLOOD
Fetal Fraction: 11
Result (T21): NEGATIVE
Trisomy 13 (Patau syndrome): NEGATIVE
Trisomy 18 (Edwards syndrome): NEGATIVE
Trisomy 21 (Down syndrome): NEGATIVE

## 2023-01-09 LAB — CULTURE, OB URINE

## 2023-01-09 LAB — URINE CULTURE, OB REFLEX

## 2023-01-10 ENCOUNTER — Encounter: Payer: Self-pay | Admitting: Certified Nurse Midwife

## 2023-01-10 ENCOUNTER — Other Ambulatory Visit: Payer: Self-pay | Admitting: Certified Nurse Midwife

## 2023-01-10 MED ORDER — NITROFURANTOIN MONOHYD MACRO 100 MG PO CAPS: 100.0000 mg | ORAL_CAPSULE | Freq: Two times a day (BID) | ORAL | 0 refills | Status: AC

## 2023-01-29 ENCOUNTER — Encounter: Payer: Self-pay | Admitting: Certified Nurse Midwife

## 2023-01-29 NOTE — Telephone Encounter (Signed)
Please advise 

## 2023-01-30 ENCOUNTER — Other Ambulatory Visit: Payer: Self-pay | Admitting: Certified Nurse Midwife

## 2023-01-30 MED ORDER — CITALOPRAM HYDROBROMIDE 20 MG PO TABS: 20.0000 mg | ORAL_TABLET | Freq: Every day | ORAL | 6 refills | Status: DC

## 2023-02-01 ENCOUNTER — Ambulatory Visit (INDEPENDENT_AMBULATORY_CARE_PROVIDER_SITE_OTHER): Payer: Medicaid Other

## 2023-02-01 VITALS — BP 103/50 | HR 73 | Wt 177.6 lb

## 2023-02-01 DIAGNOSIS — Z3480 Encounter for supervision of other normal pregnancy, unspecified trimester: Secondary | ICD-10-CM

## 2023-02-01 DIAGNOSIS — F410 Panic disorder [episodic paroxysmal anxiety] without agoraphobia: Secondary | ICD-10-CM

## 2023-02-01 DIAGNOSIS — Z8279 Family history of other congenital malformations, deformations and chromosomal abnormalities: Secondary | ICD-10-CM

## 2023-02-01 NOTE — Progress Notes (Signed)
    Return Prenatal Note   Assessment/Plan   Plan  32 y.o. G4P3003 at [redacted]w[redacted]d presents for follow-up OB visit. Reviewed prenatal record including previous visit note.  Supervision of other normal pregnancy, antepartum - Anatomy ultrasound order placed today, plan to schedule in about 2 weeks. - Discussed SOB which she feels is most likely related to her anemia. She continues to take OTC iron supplement. Will follow up if symptoms worsen.  - Plans to travel to [facility] in about 4 weeks, will plan next prenatal appointment around travel.  - Reviewed red flag warning signs anticipatory guidance for upcoming prenatal care.    Orders Placed This Encounter  Procedures   US OB DETAIL + 14 WK    Standing Status:   Future    Standing Expiration Date:   02/01/2024    Order Specific Question:   Reason for exam:    Answer:   Needs detailed anatomy scan.    Order Specific Question:   Preferred imaging location?    Answer:   [facility]   Return in about 4 weeks (around 03/01/2023) for ROB.   No future appointments.  For next visit:  continue with routine prenatal care     Subjective   32 y.o. [id number] at 107w6d presents for this follow-up prenatal visit.  Patient has been experiencing some episodes of shortness of breath. Starting to feel some flutters of fetal movement.  Patient reports: Movement: Absent Contractions: Not present  Objective   Flow sheet Vitals: Pulse Rate: 73 BP: (!) 103/50 Fundal Height: 17 cm Fetal Heart Rate (bpm): 145 Total weight gain: Not found.  General Appearance  No acute distress, well appearing, and well nourished Pulmonary   Normal work of breathing Neurologic   Alert and oriented to person, place, and time Psychiatric   Mood and affect within normal limits  Danielle Wood, CNM  09/16/241:56 PM

## 2023-02-01 NOTE — Assessment & Plan Note (Addendum)
-   Anatomy ultrasound order placed today, plan to schedule in about 2 weeks. - Discussed SOB which she feels is most likely related to her anemia. She continues to take OTC iron supplement. Will follow up if symptoms worsen.  - Plans to travel to [facility] in about 4 weeks, will plan next prenatal appointment around travel.  - Reviewed red flag warning signs anticipatory guidance for upcoming prenatal care.

## 2023-02-16 ENCOUNTER — Other Ambulatory Visit: Payer: Self-pay

## 2023-02-16 ENCOUNTER — Ambulatory Visit: Payer: Medicaid Other

## 2023-02-16 DIAGNOSIS — Z3482 Encounter for supervision of other normal pregnancy, second trimester: Secondary | ICD-10-CM | POA: Diagnosis not present

## 2023-02-16 DIAGNOSIS — Z8279 Family history of other congenital malformations, deformations and chromosomal abnormalities: Secondary | ICD-10-CM

## 2023-02-16 DIAGNOSIS — Z3480 Encounter for supervision of other normal pregnancy, unspecified trimester: Secondary | ICD-10-CM

## 2023-02-16 DIAGNOSIS — Z3A20 20 weeks gestation of pregnancy: Secondary | ICD-10-CM

## 2023-02-16 DIAGNOSIS — F410 Panic disorder [episodic paroxysmal anxiety] without agoraphobia: Secondary | ICD-10-CM

## 2023-02-23 ENCOUNTER — Other Ambulatory Visit: Payer: Self-pay

## 2023-02-23 DIAGNOSIS — O4440 Low lying placenta NOS or without hemorrhage, unspecified trimester: Secondary | ICD-10-CM

## 2023-03-01 ENCOUNTER — Ambulatory Visit (INDEPENDENT_AMBULATORY_CARE_PROVIDER_SITE_OTHER): Payer: Medicaid Other | Admitting: Licensed Practical Nurse

## 2023-03-01 ENCOUNTER — Encounter: Payer: Self-pay | Admitting: Licensed Practical Nurse

## 2023-03-01 VITALS — BP 113/76 | HR 87 | Wt 178.9 lb

## 2023-03-01 DIAGNOSIS — O2341 Unspecified infection of urinary tract in pregnancy, first trimester: Secondary | ICD-10-CM | POA: Insufficient documentation

## 2023-03-01 DIAGNOSIS — Z131 Encounter for screening for diabetes mellitus: Secondary | ICD-10-CM

## 2023-03-01 DIAGNOSIS — O4440 Low lying placenta NOS or without hemorrhage, unspecified trimester: Secondary | ICD-10-CM

## 2023-03-01 DIAGNOSIS — O4442 Low lying placenta NOS or without hemorrhage, second trimester: Secondary | ICD-10-CM

## 2023-03-01 DIAGNOSIS — Z3A21 21 weeks gestation of pregnancy: Secondary | ICD-10-CM

## 2023-03-01 DIAGNOSIS — Z3480 Encounter for supervision of other normal pregnancy, unspecified trimester: Secondary | ICD-10-CM

## 2023-03-01 NOTE — Progress Notes (Signed)
Routine Prenatal Care Visit  Subjective  Danielle Wood is a 32 y.o. [phone] at [redacted]w[redacted]d being seen today for ongoing prenatal care.  She is currently monitored for the following issues for this low-risk pregnancy and has Family history of chromosomal abnormality; Low-lying placenta; Panic attacks; and Supervision of other normal pregnancy, antepartum on their problem list.  ----------------------------------------------------------------------------------- Patient reports no complaints.  Doing well. Mood has been good, taking Celexa. -fetus very active -recently went to [employer] -works as a Radiation protection practitioner part time, 12 hour shifts  -reviewed GDM and Rhogam at next apt, ok to extend next visit to 6 weeks.  -declines flu vaccine  -plans to pump, has 38,92 and 33 year old at home. He is to get a vasectomy.   Contractions: Irritability. Vag. Bleeding: None.  Movement: Present. Leaking Fluid denies.  ----------------------------------------------------------------------------------- The following portions of the patient's history were reviewed and updated as appropriate: allergies, current medications, past family history, past medical history, past social history, past surgical history and problem list. Problem list updated.  Objective  Blood pressure 113/76, pulse 87, weight 178 lb 14.4 oz (81.1 kg), not currently breastfeeding. Pregravid weight Pregravid weight not on file Total Weight Gain Not found. Urinalysis: Urine Protein    Urine Glucose    Fetal Status: Fetal Heart Rate (bpm): 150 Fundal Height: 21 cm Movement: Present     General:  Alert, oriented and cooperative. Patient is in no acute distress.  Skin: Skin is warm and dry. No rash noted.   Cardiovascular: Normal heart rate noted  Respiratory: Normal respiratory effort, no problems with respiration noted  Abdomen: Soft, gravid, appropriate for gestational age. Pain/Pressure: Present     Pelvic:  Cervical exam deferred         Extremities: Normal range of motion.  Edema: None  Mental Status: Normal mood and affect. Normal behavior. Normal judgment and thought content.   Assessment   32 y.o. G4P3003 at [redacted]w[redacted]d by  07/06/2023, by Ultrasound presenting for routine prenatal visit  Plan   fourth Problems (from 12/11/22 to present)     Problem Noted Resolved   Supervision of other normal pregnancy, antepartum 12/11/2022 by Loran Senters, CMA No   Overview Addendum 12/11/2022 11:42 AM by Loran Senters, CMA     Clinical Staff Provider  Office Location  [facility] Dating  Not found.  Language  English Anatomy US    Flu Vaccine  offer Genetic Screen  NIPS:   TDaP vaccine   offer Hgb A1C or  GTT Early : Third trimester :   Covid declined   LAB RESULTS   Rhogam  --/--/O NEG (07/29 2952)  Blood Type --/--/O NEG (07/29 8413)   Feeding Plan pump Antibody POS (07/29 0713)  Contraception vasectomy Rubella    Circumcision yes RPR NON REACTIVE (07/29 0713)   Pediatrician  [facility]     Support Person Christopher HIV    Prenatal Classes no Varicella     GBS  (For PCN allergy, check sensitivities)   BTL Consent  Hep C     VBAC Consent  Pap Diagnosis  Date Value Ref Range Status  05/21/2021   Final   - Negative for intraepithelial lesion or malignancy (NILM)      Hgb Electro      CF      SMA                    Preterm labor symptoms and general obstetric precautions including but not  limited to vaginal bleeding, contractions, leaking of fluid and fetal movement were reviewed in detail with the patient. Please refer to After Visit Summary for other counseling recommendations.   Return in about 6 weeks (around 04/12/2023) for ROB, 28wk labs and Rhogam .  Carie Caddy, CNM  [facility]  03/01/23  4:19 PM

## 2023-04-13 ENCOUNTER — Ambulatory Visit (INDEPENDENT_AMBULATORY_CARE_PROVIDER_SITE_OTHER): Payer: Medicaid Other | Admitting: Certified Nurse Midwife

## 2023-04-13 ENCOUNTER — Other Ambulatory Visit: Payer: Medicaid Other

## 2023-04-13 VITALS — BP 121/82 | HR 91 | Wt 176.6 lb

## 2023-04-13 DIAGNOSIS — Z3480 Encounter for supervision of other normal pregnancy, unspecified trimester: Secondary | ICD-10-CM

## 2023-04-13 DIAGNOSIS — Z3483 Encounter for supervision of other normal pregnancy, third trimester: Secondary | ICD-10-CM

## 2023-04-13 DIAGNOSIS — Z131 Encounter for screening for diabetes mellitus: Secondary | ICD-10-CM

## 2023-04-13 DIAGNOSIS — Z23 Encounter for immunization: Secondary | ICD-10-CM

## 2023-04-13 DIAGNOSIS — Z3A28 28 weeks gestation of pregnancy: Secondary | ICD-10-CM

## 2023-04-13 LAB — POCT URINALYSIS DIPSTICK OB
Bilirubin, UA: NEGATIVE
Blood, UA: NEGATIVE
Glucose, UA: NEGATIVE
Ketones, UA: NEGATIVE
Leukocytes, UA: NEGATIVE
Nitrite, UA: NEGATIVE
POC,PROTEIN,UA: NEGATIVE
Spec Grav, UA: 1.015 (ref 1.010–1.025)
Urobilinogen, UA: 0.2 U/dL
pH, UA: 6.5 (ref 5.0–8.0)

## 2023-04-13 NOTE — Patient Instructions (Signed)

## 2023-04-13 NOTE — Progress Notes (Signed)
    Return Prenatal Note   Subjective   32 y.o. Z6X0960 at [redacted]w[redacted]d presents for this follow-up prenatal visit.  Luxe feeling well, EPDS today 5, continues on Celexa 20mg  without problem. GDM screening today. Initial BP elevated, repeat normal. Patient reports: Movement: Present Contractions: Not present  Objective   Flow sheet Vitals: Pulse Rate: 91 BP: 121/82 Fundal Height: 28 cm Fetal Heart Rate (bpm): 140 Total weight gain: 1 lb 9.6 oz (0.726 kg)  General Appearance  No acute distress, well appearing, and well nourished Pulmonary   Normal work of breathing Neurologic   Alert and oriented to person, place, and time Psychiatric   Mood and affect within normal limits  Assessment/Plan   Plan  32 y.o. A5W0981 at [redacted]w[redacted]d presents for follow-up OB visit. Reviewed prenatal record including previous visit note.  1. Supervision of other normal pregnancy, antepartum - Tdap vaccine greater than or equal to 7yo IM - POC Urinalysis Dipstick OB  2. [redacted] weeks gestation of pregnancy - Tdap vaccine greater than or equal to 7yo IM - POC Urinalysis Dipstick OB  3. Need for tetanus booster - Tdap vaccine greater than or equal to 7yo IM  Ultrasound tomorrow to assess placental location, was 1.58cm from os at anatomy ultrasound.   Orders Placed This Encounter  Procedures   Tdap vaccine greater than or equal to 7yo IM   POC Urinalysis Dipstick OB   Return in 2 weeks (on 04/27/2023) for ROB.   Future Appointments  Date Time Provider Department Center  04/14/2023  1:00 PM [facility]  05/04/2023  1:15 PM Doreene Burke, CNM [facility] None    For next visit:  continue with routine prenatal care, rhogam at next visit.     Dominica Severin, CNM  04/12/2410:49 AM

## 2023-04-14 ENCOUNTER — Ambulatory Visit
Admission: RE | Admit: 2023-04-14 | Discharge: 2023-04-14 | Disposition: A | Discharge status: Home / Self Care | Payer: Medicaid Other | Source: Ambulatory Visit

## 2023-04-14 DIAGNOSIS — O4440 Low lying placenta NOS or without hemorrhage, unspecified trimester: Secondary | ICD-10-CM | POA: Diagnosis present

## 2023-04-14 LAB — 28 WEEKS RH-PANEL
Antibody Screen: NEGATIVE
Basophils Absolute: 0 10*3/uL (ref 0.0–0.2)
Basos: 0 %
EOS (ABSOLUTE): 0.1 10*3/uL (ref 0.0–0.4)
Eos: 1 %
Gestational Diabetes Screen: 88 mg/dL (ref 70–139)
HIV Screen 4th Generation wRfx: NONREACTIVE
Hematocrit: 36.7 % (ref 34.0–46.6)
Hemoglobin: 12.2 g/dL (ref 11.1–15.9)
Immature Grans (Abs): 0 10*3/uL (ref 0.0–0.1)
Immature Granulocytes: 0 %
Lymphocytes Absolute: 1.2 10*3/uL (ref 0.7–3.1)
Lymphs: 16 %
MCH: 32 pg (ref 26.6–33.0)
MCHC: 33.2 g/dL (ref 31.5–35.7)
MCV: 96 fL (ref 79–97)
Monocytes Absolute: 0.4 10*3/uL (ref 0.1–0.9)
Monocytes: 5 %
Neutrophils Absolute: 5.5 10*3/uL (ref 1.4–7.0)
Neutrophils: 78 %
Platelets: 252 10*3/uL (ref 150–450)
RBC: 3.81 x10E6/uL (ref 3.77–5.28)
RDW: 12 % (ref 11.7–15.4)
RPR Ser Ql: NONREACTIVE
WBC: 7.1 10*3/uL (ref 3.4–10.8)

## 2023-05-04 ENCOUNTER — Ambulatory Visit (INDEPENDENT_AMBULATORY_CARE_PROVIDER_SITE_OTHER): Payer: Medicaid Other | Admitting: Certified Nurse Midwife

## 2023-05-04 VITALS — BP 117/79 | HR 93 | Wt 185.8 lb

## 2023-05-04 DIAGNOSIS — O360130 Maternal care for anti-D [Rh] antibodies, third trimester, not applicable or unspecified: Secondary | ICD-10-CM | POA: Diagnosis not present

## 2023-05-04 DIAGNOSIS — Z3A31 31 weeks gestation of pregnancy: Secondary | ICD-10-CM | POA: Diagnosis not present

## 2023-05-04 DIAGNOSIS — Z6791 Unspecified blood type, Rh negative: Secondary | ICD-10-CM

## 2023-05-04 LAB — POCT URINALYSIS DIPSTICK OB
Bilirubin, UA: NEGATIVE
Blood, UA: NEGATIVE
Glucose, UA: NEGATIVE
Ketones, UA: NEGATIVE
Leukocytes, UA: NEGATIVE
Nitrite, UA: NEGATIVE
Spec Grav, UA: >=1.03 — AB (ref 1.010–1.025)
Urobilinogen, UA: 0.2 U/dL
pH, UA: 5 (ref 5.0–8.0)

## 2023-05-04 MED ORDER — RHO D IMMUNE GLOBULIN 1500 UNIT/2ML IJ SOSY
300.0000 ug | PREFILLED_SYRINGE | Freq: Once | INTRAMUSCULAR | Status: AC
  Administered 2023-05-04: 300 ug via INTRAMUSCULAR

## 2023-05-04 NOTE — Patient Instructions (Signed)
 Preterm Labor Pregnancy normally lasts 39-41 weeks. Preterm labor is when labor starts before you have been pregnant for 37 weeks. Babies who are born too early may have a higher risk for long-term problems like cerebral palsy or developmental delays. They may also have problems soon after birth, such as problems with blood sugar, body temperature, heart, and breathing. These problems may be very serious in babies who are born before 34 weeks of pregnancy. What are the causes? The cause of this condition is not known. What increases the risk? You are more likely to have preterm labor if: You have medical problems, now or in the past. You have problems now or in your past pregnancies. You have lifestyle problems. Medical history You have problems of the womb (uterus). You have an infection, including infections you get from sex. You have problems that do not go away, such as: Blood clots. High blood pressure. High blood sugar. You have low body weight or too much body weight. Present and past pregnancies You have had preterm labor before. You are pregnant with two babies or more. You have a condition in which the placenta covers your cervix. You waited less than 18 months between giving birth and becoming pregnant again. Your unborn baby has some problems. You have bleeding from your vagina. You became pregnant by a method called IVF. Lifestyle You smoke. You drink alcohol. You use drugs. You have stress. You have abuse in your home. You come in contact with chemicals that harm the body (pollutants). Other factors You are younger than 17 years or older than 35 years. What are the signs or symptoms? Symptoms of this condition include: Cramps. The cramps may feel like cramps from a period. You may also have watery poop (diarrhea). Pain in the belly (abdomen). Pain in the lower back. Regular contractions. It may feel like your belly is getting tighter. Pressure in the lower  belly. More fluid leaking from the vagina. The fluid may be watery or bloody. Water breaking. How is this treated? Treatment for this condition depends on your health, the health of your baby, and how old your pregnancy is. It may include: Taking medicines, such as: Hormone medicines. Medicines to stop contractions. Medicines to help mature the baby's lungs. Medicines to prevent your baby from getting cerebral palsy or other problems. Bed rest. If the labor happens before 34 weeks of pregnancy, you may need to stay in the hospital. Delivering the baby. Follow these instructions at home:  Do not smoke or use any products that contain nicotine or tobacco. If you need help quitting, ask your doctor. Do not drink alcohol. Take over-the-counter and prescription medicines only as told by your doctor. Rest as told by your doctor. Return to your normal activities when your doctor says that it is safe. Keep all follow-up visits. How is this prevented? To have a healthy pregnancy: Do not use drugs. Do not use any medicines unless you ask your doctor if they are safe for you. Talk with your doctor before taking any herbal supplements. Make sure you gain enough weight. Watch for infection. If you think you might have an infection, get it checked right away. Symptoms of infection may include: Fever. Vaginal discharge that smells bad or is not normal. Pain or burning when you pee. Needing to pee urgently. Needing to pee often. Peeing small amounts often. Blood in your pee. Pee that smells bad or unusual. Where to find more information U.S. Department of Health and [employer]  Office on [employer] Health: http://hoffman.com/ [employer] of Obstetricians and Gynecologists: www.acog.org Centers for Disease Control and Prevention: FootballExhibition.com.br Contact a doctor if: You think you are going into preterm labor. You have symptoms of preterm labor. You have symptoms of infection. Get help  right away if: You are having painful contractions every 5 minutes or less. Your water breaks. Summary Preterm labor is labor that starts before you reach 37 weeks of pregnancy. Your baby may have problems if delivered early. You are more likely to have preterm labor if you have certain medical problems or problems with a pregnancy now or in the past. Some lifestyle factors can also increase the risk. Contact a doctor if you have symptoms of preterm labor. This information is not intended to replace advice given to you by your health care provider. Make sure you discuss any questions you have with your health care provider. Document Revised: 05/03/2020 Document Reviewed: 05/07/2020 Elsevier Patient Education  2024 [employer].

## 2023-05-04 NOTE — Progress Notes (Signed)
ROB doing well, feeling good movement. Rhogam today. Pt expressed concern regarding 9lb wt gain in 3 wks. Reassurance given. Discussed diet and exercise. She is active and states nothing has changed with her diet. Fundal height appropriate. Urine culture repeated today. UTI at nob.   Follow up 2 ws for ROB.   Doreene Burke, CNM

## 2023-05-19 NOTE — L&D Delivery Note (Signed)
         Delivery Note   Danielle Wood is a 33 y.o. Z6X0960 at [redacted]w[redacted]d Estimated Date of Delivery: 07/06/23  PRE-OPERATIVE DIAGNOSIS:  1) [redacted]w[redacted]d pregnancy.    POST-OPERATIVE DIAGNOSIS:  1) [redacted]w[redacted]d pregnancy s/p Vaginal, Spontaneous   Delivery Type: Vaginal, Spontaneous   Delivery Anesthesia: Epidural  Labor Complications:  none    ESTIMATED BLOOD LOSS: 100 ml    FINDINGS:   1) female infant, Apgar scores of  8 at 1 minute and   9 at 5 minutes and a birthweight of 7lbs 13 ounces.    2) Nuchal cord: loose x 1 , easily reduced   SPECIMENS:   PLACENTA:   Appearance: Intact, 3 vessel cord, cord blood collected   Removal: Spontaneous     Disposition:  per protocol   DISPOSITION:  Infant to left in stable condition in the delivery room, with L&D personnel and mother,  NARRATIVE SUMMARY: Labor course:  [name] is a A5W0981 at [redacted]w[redacted]d who presented for PROM.  She progressed well in labor with pitocin.  She received the appropriate epidural anesthesia and proceeded to complete dilation. She evidenced good maternal expulsive effort during the second stage. She went on to deliver a viable female  infant "Chrissie Noa" . The placenta delivered without problems and was noted to be complete. A perineal and vaginal examination was performed. Episiotomy/Lacerations:  intact . The patient tolerated this well.  Doreene Burke, CNM  06/29/2023 4:05 PM

## 2023-05-20 ENCOUNTER — Ambulatory Visit (INDEPENDENT_AMBULATORY_CARE_PROVIDER_SITE_OTHER): Payer: Medicaid Other | Admitting: Obstetrics and Gynecology

## 2023-05-20 ENCOUNTER — Encounter: Payer: Self-pay | Admitting: Obstetrics and Gynecology

## 2023-05-20 VITALS — BP 117/79 | HR 98 | Wt 187.0 lb

## 2023-05-20 DIAGNOSIS — Z3480 Encounter for supervision of other normal pregnancy, unspecified trimester: Secondary | ICD-10-CM

## 2023-05-20 DIAGNOSIS — Z3A33 33 weeks gestation of pregnancy: Secondary | ICD-10-CM

## 2023-05-20 NOTE — Progress Notes (Signed)
 ROB: She has no complaints today.  Reports daily fetal movement.  Is having Braxton Hicks contraction but nothing strong.  Baby noted to be breech today.  Beginning discussions regarding possibility of external cephalic version continue to follow for position.  Vertex in the right upper quadrant.

## 2023-05-20 NOTE — Progress Notes (Signed)
 ROB. She states daily fetal movement with occasional back pain. Declines RSV vaccine. She states no questions or concerns at this time.

## 2023-05-28 ENCOUNTER — Other Ambulatory Visit (HOSPITAL_COMMUNITY)
Admission: RE | Admit: 2023-05-28 | Discharge: 2023-05-28 | Disposition: A | Discharge status: Home / Self Care | Payer: Medicaid Other | Source: Ambulatory Visit | Attending: Certified Nurse Midwife | Admitting: Certified Nurse Midwife

## 2023-05-28 ENCOUNTER — Encounter: Payer: Self-pay | Admitting: Certified Nurse Midwife

## 2023-05-28 ENCOUNTER — Ambulatory Visit (INDEPENDENT_AMBULATORY_CARE_PROVIDER_SITE_OTHER): Payer: Medicaid Other | Admitting: Certified Nurse Midwife

## 2023-05-28 VITALS — BP 104/84 | HR 100 | Wt 189.0 lb

## 2023-05-28 DIAGNOSIS — R102 Pelvic and perineal pain: Secondary | ICD-10-CM

## 2023-05-28 DIAGNOSIS — Z3480 Encounter for supervision of other normal pregnancy, unspecified trimester: Secondary | ICD-10-CM | POA: Diagnosis present

## 2023-05-28 DIAGNOSIS — Z6791 Unspecified blood type, Rh negative: Secondary | ICD-10-CM | POA: Diagnosis present

## 2023-05-28 DIAGNOSIS — O368130 Decreased fetal movements, third trimester, not applicable or unspecified: Secondary | ICD-10-CM

## 2023-05-28 DIAGNOSIS — Z3A34 34 weeks gestation of pregnancy: Secondary | ICD-10-CM

## 2023-05-28 DIAGNOSIS — N898 Other specified noninflammatory disorders of vagina: Secondary | ICD-10-CM

## 2023-05-28 DIAGNOSIS — O99891 Other specified diseases and conditions complicating pregnancy: Secondary | ICD-10-CM

## 2023-05-28 DIAGNOSIS — O26813 Pregnancy related exhaustion and fatigue, third trimester: Secondary | ICD-10-CM | POA: Insufficient documentation

## 2023-05-28 DIAGNOSIS — R42 Dizziness and giddiness: Secondary | ICD-10-CM | POA: Diagnosis present

## 2023-05-28 DIAGNOSIS — O26893 Other specified pregnancy related conditions, third trimester: Secondary | ICD-10-CM | POA: Diagnosis present

## 2023-05-28 DIAGNOSIS — O26899 Other specified pregnancy related conditions, unspecified trimester: Secondary | ICD-10-CM | POA: Insufficient documentation

## 2023-05-28 MED ORDER — PANTOPRAZOLE SODIUM 20 MG PO TBEC: 20.0000 mg | DELAYED_RELEASE_TABLET | Freq: Every day | ORAL | 2 refills | Status: DC

## 2023-05-28 NOTE — Assessment & Plan Note (Signed)
 Reviewed kick counts and preterm labor warning signs. Instructed to call office or come to hospital with persistent headache, vision changes, regular contractions, leaking of fluid, decreased fetal movement or vaginal bleeding.

## 2023-05-28 NOTE — Assessment & Plan Note (Signed)
-  SI belt recommended. -PT referral if worsens or no improvement with belt for support. -Avoid asymmetric movements

## 2023-05-28 NOTE — Progress Notes (Signed)
    Return Prenatal Note   Subjective   33 y.o. Danielle Wood at [redacted]w[redacted]d presents for this follow-up prenatal visit.  Patient reports 3 days of contractions & lower abdomina pain/cramping as well as pelvic pain & trouble walking due to pain. She vomited multiple times this morning but due to pain and not due to GI upset. Denies sick contacts, fevers or chills, no diarrhea/loose stools. She was able to receive IV hydration at home from family member but otherwise is struggling to keep food down. She denies palpitations but does feel her heartbeat is fast. She reports baby is moving well today but has been decreased. She had ~24 of light pink discharge with wiping 2 days ago, denies bright red bleeding or loss of fluid. Patient reports: Movement: Present Contractions: Not present  Objective   Flow sheet Vitals: Pulse Rate: 100 BP: 104/84 Fetal Heart Rate (bpm): 130 Presentation: Vertex Total weight gain: 14 lb (6.35 kg)   Fetus A Non-Stress Test Interpretation for 05/28/23  Indication: Decreased Fetal Movement  Fetal Heart Rate A Mode: External Baseline Rate (A): 130 bpm Variability: Moderate Accelerations: 15 x 15 Decelerations: None Multiple birth?: No  Uterine Activity Mode: Toco Contraction Frequency (min): none noted Resting Tone Palpated: Relaxed Resting Time: Adequate  Interpretation (Fetal Testing) Nonstress Test Interpretation: Reactive Overall Impression: Reassuring for gestational age    General Appearance  No acute distress, well appearing, and well nourished Pulmonary   Normal work of breathing Abdomen   Soft, gravid, tender to symphysis pubis Genitourinary   Speculum exam reveals physiologic discharge, cervix visually closed without bleeding Neurologic   Alert and oriented to person, place, and time Psychiatric   Mood and affect within normal limits  Assessment/Plan   Plan  33 y.o. Danielle Wood at [redacted]w[redacted]d presents for follow-up OB visit. Reviewed prenatal record  including previous visit note. Given multiple symptoms with uncertain etiology CBC, CMP, TSH drawn as well as urine & vaginal swab for infection. NST completed & reactive. Reinforced call signs & when to present to hospital. Will follow up with results.  Supervision of other normal pregnancy, antepartum Reviewed kick counts and preterm labor warning signs. Instructed to call office or come to hospital with persistent headache, vision changes, regular contractions, leaking of fluid, decreased fetal movement or vaginal bleeding.   Pelvic pain affecting pregnancy -SI belt recommended. -PT referral if worsens or no improvement with belt for support. -Avoid asymmetric movements     Orders Placed This Encounter  Procedures   Urine Culture   Comp Met (CMET)   TSH + free T4   CBC With Diff/Platelet   Return in 2 weeks (on 06/11/2023) for ROB.   Future Appointments  Date Time Provider Department Center  06/02/2023  1:55 PM Sebastian Sham, CNM [facility] None    For next visit:  ROB with GBS screening      Danielle Wood, CNM  05/27/2510:47 PM

## 2023-05-28 NOTE — Patient Instructions (Signed)
Third Trimester of Pregnancy  The third trimester of pregnancy is from week 28 through week 40. This is months 7 through 9. The third trimester is a time when the unborn baby (fetus) is growing rapidly. At the end of the ninth month, the fetus is about 20 inches long and weighs 6-10 pounds. Body changes during your third trimester During the third trimester, your body will continue to go through many changes. The changes vary and generally return to normal after your baby is born. Physical changes Your weight will continue to increase. You can expect to gain 25-35 pounds (11-16 kg) by the end of the pregnancy if you begin pregnancy at a normal weight. If you are underweight, you can expect to gain 28-40 lb (about 13-18 kg), and if you are overweight, you can expect to gain 15-25 lb (about 7-11 kg). You may begin to get stretch marks on your hips, abdomen, and breasts. Your breasts will continue to grow and may hurt. A yellow fluid (colostrum) may leak from your breasts. This is the first milk you are producing for your baby. You may have changes in your hair. These can include thickening of your hair, rapid growth, and changes in texture. Some people also have hair loss during or after pregnancy, or hair that feels dry or thin. Your belly button may stick out. You may notice more swelling in your hands, face, or ankles. Health changes You may have heartburn. You may have constipation. You may develop hemorrhoids. You may develop swollen, bulging veins (varicose veins) in your legs. You may have increased body aches in the pelvis, back, or thighs. This is due to weight gain and increased hormones that are relaxing your joints. You may have increased tingling or numbness in your hands, arms, and legs. The skin on your abdomen may also feel numb. You may feel short of breath because of your expanding uterus. Other changes You may urinate more often because the fetus is moving lower into your pelvis  and pressing on your bladder. You may have more problems sleeping. This may be caused by the size of your abdomen, an increased need to urinate, and an increase in your body's metabolism. You may notice the fetus "dropping," or moving lower in your abdomen (lightening). You may have increased vaginal discharge. You may notice that you have pain around your pelvic bone as your uterus distends. Follow these instructions at home: Medicines Follow your health care provider's instructions regarding medicine use. Specific medicines may be either safe or unsafe to take during pregnancy. Do not take any medicines unless approved by your health care provider. Take a prenatal vitamin that contains at least 600 micrograms (mcg) of folic acid. Eating and drinking Eat a healthy diet that includes fresh fruits and vegetables, whole grains, good sources of protein such as meat, eggs, or tofu, and low-fat dairy products. Avoid raw meat and unpasteurized juice, milk, and cheese. These carry germs that can harm you and your baby. Eat 4 or 5 small meals rather than 3 large meals a day. You may need to take these actions to prevent or treat constipation: Drink enough fluid to keep your urine pale yellow. Eat foods that are high in fiber, such as beans, whole grains, and fresh fruits and vegetables. Limit foods that are high in fat and processed sugars, such as fried or sweet foods. Activity Exercise only as directed by your health care provider. Most people can continue their usual exercise routine during pregnancy. Try   to exercise for 30 minutes at least 5 days a week. Stop exercising if you experience contractions in the uterus. Stop exercising if you develop pain or cramping in the lower abdomen or lower back. Avoid heavy lifting. Do not exercise if it is very hot or humid or if you are at a high altitude. If you choose to, you may continue to have sex unless your health care provider tells you not  to. Relieving pain and discomfort Take frequent breaks and rest with your legs raised (elevated) if you have leg cramps or low back pain. Take warm sitz baths to soothe any pain or discomfort caused by hemorrhoids. Use hemorrhoid cream if your health care provider approves. Wear a supportive bra to prevent discomfort from breast tenderness. If you develop varicose veins: Wear support hose as told by your health care provider. Elevate your feet for 15 minutes, 3-4 times a day. Limit salt in your diet. Safety Talk to your health care provider before traveling far distances. Do not use hot tubs, steam rooms, or saunas. Wear your seat belt at all times when driving or riding in a car. Talk with your health care provider if someone is verbally or physically abusive to you. Preparing for birth To prepare for the arrival of your baby: Take prenatal classes to understand, practice, and ask questions about labor and delivery. Visit the hospital and tour the maternity area. Purchase a rear-facing car seat and make sure you know how to install it in your car. Prepare the baby's room or sleeping area. Make sure to remove all pillows and stuffed animals from the baby's crib to prevent suffocation. General instructions Avoid cat litter boxes and soil used by cats. These carry germs that can cause birth defects in the baby. If you have a cat, ask someone to clean the litter box for you. Do not douche or use tampons. Do not use scented sanitary pads. Do not use any products that contain nicotine or tobacco, such as cigarettes, e-cigarettes, and chewing tobacco. If you need help quitting, ask your health care provider. Do not use any herbal remedies, illegal drugs, or medicines that were not prescribed to you. Chemicals in these products can harm your baby. Do not drink alcohol. You will have more frequent prenatal exams during the third trimester. During a routine prenatal visit, your health care provider  will do a physical exam, perform tests, and discuss your overall health. Keep all follow-up visits. This is important. Where to find more information American Pregnancy Association: americanpregnancy.org American College of Obstetricians and Gynecologists: acog.org/en/Womens%20Health/Pregnancy Office on Women's Health: womenshealth.gov/pregnancy Contact a health care provider if you have: A fever. Mild pelvic cramps, pelvic pressure, or nagging pain in your abdominal area or lower back. Vomiting or diarrhea. Bad-smelling vaginal discharge or foul-smelling urine. Pain when you urinate. A headache that does not go away when you take medicine. Visual changes or see spots in front of your eyes. Get help right away if: Your water breaks. You have regular contractions less than 5 minutes apart. You have spotting or bleeding from your vagina. You have severe abdominal pain. You have difficulty breathing. You have chest pain. You have fainting spells. You have not felt your baby move for the time period told by your health care provider. You have new or increased pain, swelling, or redness in an arm or leg. Summary The third trimester of pregnancy is from week 28 through week 40 (months 7 through 9). You may have more problems   sleeping. This can be caused by the size of your abdomen, an increased need to urinate, and an increase in your body's metabolism. You will have more frequent prenatal exams during the third trimester. Keep all follow-up visits. This is important. This information is not intended to replace advice given to you by your health care provider. Make sure you discuss any questions you have with your health care provider. Document Revised: 10/11/2019 Document Reviewed: 08/17/2019 Elsevier Patient Education  2024 [employer]  Group B Streptococcus Infection During Pregnancy Group B Streptococcus (GBS) is a type of bacteria that is often found in healthy people. It is  commonly found in the rectum, vagina, and intestines. In people who are healthy and not pregnant, the bacteria rarely cause serious illness or complications. However, women who test positive for GBS during pregnancy can pass the bacteria to the baby during childbirth. This can cause serious infection in the baby after birth. Women with GBS may also have infections during their pregnancy or soon after childbirth. The infections include urinary tract infections (UTIs) or infections of the uterus. GBS also increases a woman's risk of complications during pregnancy, such as early labor or delivery, miscarriage, or stillbirth. Routine testing for GBS is recommended for all pregnant women. What are the causes? This condition is caused by bacteria called Streptococcus agalactiae. What increases the risk? You may have a higher risk for GBS infection during pregnancy if you had one during a past pregnancy. What are the signs or symptoms? In most cases, GBS infection does not cause symptoms in pregnant women. If symptoms exist, they may include: Labor that starts before the 37th week of pregnancy. A UTI or bladder infection. This may cause a fever, frequent urination, or pain and burning during urination. Fever during labor. There can also be a rapid heartbeat in the mother or baby. Rare but serious symptoms of a GBS infection in women include: Blood infection (septicemia). This may cause fever, chills, or confusion. Lung infection (pneumonia). This may cause fever, chills, cough, rapid breathing, chest pain, or difficulty breathing. Bone, joint, skin, or soft tissue infection. How is this diagnosed? You may be screened for GBS between week 35 and week 37 of pregnancy. If you have symptoms of preterm labor, you may be screened earlier. This condition is diagnosed based on lab test results from: A swab of fluid from the vagina and rectum. A urine sample. How is this treated? This condition is treated with  antibiotic medicine. Antibiotic medicine may be given: To you when you go into labor, or as soon as your water breaks. The medicines will continue until after you give birth. If you are having a cesarean delivery, you do not need antibiotics unless your water has broken. To your baby, if he or she requires treatment. Your health care provider will check your baby to decide if he or she needs antibiotics to prevent a serious infection. Follow these instructions at home: Take over-the-counter and prescription medicines only as told by your health care provider. Take your antibiotic medicine as told by your health care provider. Do not stop taking the antibiotic even if you start to feel better. Keep all pre-birth (prenatal) visits and follow-up visits as told by your health care provider. This is important. Contact a health care provider if: You have pain or burning when you urinate. You have to urinate more often than usual. You have a fever or chills. You develop a bad-smelling vaginal discharge. Get help right away if:   Your water breaks. You go into labor. You have severe pain in your abdomen. You have difficulty breathing. You have chest pain. These symptoms may represent a serious problem that is an emergency. Do not wait to see if the symptoms will go away. Get medical help right away. Call your local emergency services (911 in the [place] Do not drive yourself to the hospital. Summary GBS is a type of bacteria that is common in healthy people. During pregnancy, colonization with GBS can cause serious complications for you or your baby. Your health care provider will screen you between 35 and 37 weeks of pregnancy to determine if you are colonized with GBS. If you are colonized with GBS during pregnancy, your health care provider will recommend antibiotics through an IV during labor. After delivery, your baby will be evaluated for complications related to potential GBS infection and may  require antibiotics to prevent a serious infection. This information is not intended to replace advice given to you by your health care provider. Make sure you discuss any questions you have with your health care provider. Document Revised: 04/20/2022 Document Reviewed: 04/20/2022 Elsevier Patient Education  2024 [employer] Inc.  

## 2023-05-29 ENCOUNTER — Encounter: Payer: Self-pay | Admitting: Obstetrics and Gynecology

## 2023-05-29 LAB — CBC WITH DIFF/PLATELET
Basophils Absolute: 0 10*3/uL (ref 0.0–0.2)
Basos: 0 %
EOS (ABSOLUTE): 0 10*3/uL (ref 0.0–0.4)
Eos: 0 %
Hematocrit: 32.4 % — ABNORMAL LOW (ref 34.0–46.6)
Hemoglobin: 11.1 g/dL (ref 11.1–15.9)
Immature Grans (Abs): 0 10*3/uL (ref 0.0–0.1)
Immature Granulocytes: 0 %
Lymphocytes Absolute: 0.5 10*3/uL — ABNORMAL LOW (ref 0.7–3.1)
Lymphs: 10 %
MCH: 32.4 pg (ref 26.6–33.0)
MCHC: 34.3 g/dL (ref 31.5–35.7)
MCV: 95 fL (ref 79–97)
Monocytes Absolute: 0.4 10*3/uL (ref 0.1–0.9)
Monocytes: 7 %
Neutrophils Absolute: 4.4 10*3/uL (ref 1.4–7.0)
Neutrophils: 83 %
Platelets: 217 10*3/uL (ref 150–450)
RBC: 3.43 x10E6/uL — ABNORMAL LOW (ref 3.77–5.28)
RDW: 12.4 % (ref 11.7–15.4)
WBC: 5.3 10*3/uL (ref 3.4–10.8)

## 2023-05-29 LAB — COMPREHENSIVE METABOLIC PANEL
ALT: 9 [IU]/L (ref 0–32)
AST: 16 [IU]/L (ref 0–40)
Albumin: 3.3 g/dL — ABNORMAL LOW (ref 3.9–4.9)
Alkaline Phosphatase: 78 [IU]/L (ref 44–121)
BUN/Creatinine Ratio: 9 (ref 9–23)
BUN: 4 mg/dL — ABNORMAL LOW (ref 6–20)
Bilirubin Total: 0.5 mg/dL (ref 0.0–1.2)
CO2: 19 mmol/L — ABNORMAL LOW (ref 20–29)
Calcium: 8.2 mg/dL — ABNORMAL LOW (ref 8.7–10.2)
Chloride: 104 mmol/L (ref 96–106)
Creatinine, Ser: 0.46 mg/dL — ABNORMAL LOW (ref 0.57–1.00)
Globulin, Total: 2.4 g/dL (ref 1.5–4.5)
Glucose: 111 mg/dL — ABNORMAL HIGH (ref 70–99)
Potassium: 3.8 mmol/L (ref 3.5–5.2)
Sodium: 137 mmol/L (ref 134–144)
Total Protein: 5.7 g/dL — ABNORMAL LOW (ref 6.0–8.5)
eGFR: 129 mL/min/{1.73_m2} (ref >59)

## 2023-05-29 LAB — TSH+FREE T4
Free T4: 0.87 ng/dL (ref 0.82–1.77)
TSH: 0.946 u[IU]/mL (ref 0.450–4.500)

## 2023-05-30 ENCOUNTER — Encounter: Payer: Self-pay | Admitting: Certified Nurse Midwife

## 2023-05-30 LAB — URINE CULTURE: Organism ID, Bacteria: NO GROWTH

## 2023-05-31 ENCOUNTER — Encounter: Payer: Self-pay | Admitting: Certified Nurse Midwife

## 2023-05-31 ENCOUNTER — Other Ambulatory Visit: Payer: Self-pay | Admitting: Licensed Practical Nurse

## 2023-05-31 DIAGNOSIS — O219 Vomiting of pregnancy, unspecified: Secondary | ICD-10-CM

## 2023-05-31 LAB — CERVICOVAGINAL ANCILLARY ONLY
Bacterial Vaginitis (gardnerella): NEGATIVE
Candida Glabrata: NEGATIVE
Candida Vaginitis: NEGATIVE
Comment: NEGATIVE
Comment: NEGATIVE
Comment: NEGATIVE

## 2023-06-02 ENCOUNTER — Encounter: Payer: Self-pay | Admitting: Certified Nurse Midwife

## 2023-06-02 ENCOUNTER — Ambulatory Visit (INDEPENDENT_AMBULATORY_CARE_PROVIDER_SITE_OTHER): Payer: Medicaid Other | Admitting: Certified Nurse Midwife

## 2023-06-02 VITALS — BP 127/83 | HR 82 | Wt 188.3 lb

## 2023-06-02 DIAGNOSIS — O219 Vomiting of pregnancy, unspecified: Secondary | ICD-10-CM

## 2023-06-02 DIAGNOSIS — Z3A35 35 weeks gestation of pregnancy: Secondary | ICD-10-CM

## 2023-06-02 LAB — POCT URINALYSIS DIPSTICK OB
Bilirubin, UA: NEGATIVE
Blood, UA: NEGATIVE
Glucose, UA: NEGATIVE
Leukocytes, UA: NEGATIVE
Nitrite, UA: NEGATIVE
POC,PROTEIN,UA: NEGATIVE
Spec Grav, UA: 1.015 (ref 1.010–1.025)
Urobilinogen, UA: 0.2 U/dL
pH, UA: 6 (ref 5.0–8.0)

## 2023-06-02 NOTE — Progress Notes (Signed)
 Rob, doing well, Feeling good movement. Discussed u/s for growth. She is in agreement. Pt has scheduled for 1/31. She denies any concerns today. Baby feels vertex on exam. Discussed Gbs testing next visit.   Follow up 1 wk.   Alise Appl, CNM

## 2023-06-02 NOTE — Patient Instructions (Signed)

## 2023-06-03 MED ORDER — ONDANSETRON HCL 4 MG PO TABS: 4.0000 mg | ORAL_TABLET | Freq: Every day | ORAL | 1 refills | Status: DC | PRN

## 2023-06-09 ENCOUNTER — Encounter: Payer: Self-pay | Admitting: Certified Nurse Midwife

## 2023-06-09 ENCOUNTER — Other Ambulatory Visit (HOSPITAL_COMMUNITY)
Admission: RE | Admit: 2023-06-09 | Discharge: 2023-06-09 | Disposition: A | Discharge status: Home / Self Care | Payer: Medicaid Other | Source: Ambulatory Visit | Attending: Certified Nurse Midwife | Admitting: Certified Nurse Midwife

## 2023-06-09 ENCOUNTER — Ambulatory Visit (INDEPENDENT_AMBULATORY_CARE_PROVIDER_SITE_OTHER): Payer: Medicaid Other | Admitting: Certified Nurse Midwife

## 2023-06-09 VITALS — BP 125/84 | HR 76 | Wt 187.7 lb

## 2023-06-09 DIAGNOSIS — Z3685 Encounter for antenatal screening for Streptococcus B: Secondary | ICD-10-CM

## 2023-06-09 DIAGNOSIS — Z3A36 36 weeks gestation of pregnancy: Secondary | ICD-10-CM

## 2023-06-09 DIAGNOSIS — Z3493 Encounter for supervision of normal pregnancy, unspecified, third trimester: Secondary | ICD-10-CM

## 2023-06-09 DIAGNOSIS — Z113 Encounter for screening for infections with a predominantly sexual mode of transmission: Secondary | ICD-10-CM | POA: Diagnosis present

## 2023-06-09 DIAGNOSIS — Z3483 Encounter for supervision of other normal pregnancy, third trimester: Secondary | ICD-10-CM

## 2023-06-09 LAB — POCT URINALYSIS DIPSTICK OB
Bilirubin, UA: NEGATIVE
Blood, UA: NEGATIVE
Glucose, UA: NEGATIVE
Ketones, UA: NEGATIVE
Leukocytes, UA: NEGATIVE
Nitrite, UA: NEGATIVE
Spec Grav, UA: >=1.03 — AB (ref 1.010–1.025)
Urobilinogen, UA: 0.2 U/dL
pH, UA: 5 (ref 5.0–8.0)

## 2023-06-09 LAB — CERVICOVAGINAL ANCILLARY ONLY
Chlamydia: NEGATIVE
Comment: NEGATIVE
Comment: NORMAL
Neisseria Gonorrhea: NEGATIVE

## 2023-06-09 NOTE — Patient Instructions (Signed)
Braxton Hicks Contractions: Self-Care Braxton Hicks contractions may feel like labor contractions, but they're like practice for real labor. They can start when you're halfway through your pregnancy. During the last 2 months of your pregnancy, these contractions may happen more and hurt more. However, they aren't a sign that you're in real labor. What are Deberah Pelton contractions? Braxton Hicks contractions make your belly muscles tighten. They aren't real labor because they don't make your cervix, which is the lowest part of your uterus, open and thin. This tightening of your belly before labor starts can also be called false labor. How to tell the difference between true labor and false labor True labor contractions Last 60-90 seconds. Happen on a pattern. Get stronger and last longer over time. Don't go away when you: Walk. Change positions. Rest. Discomfort often begins in the back and then moves to the front. The cervix opens and thins. False labor contractions Are weak and last a short time. Don't happen on a pattern. Happen farther apart than true labor. May go away when you: Walk. Change positions. Rest. May be noticed more at the end of the day. Discomfort is often only felt in the front of the belly. The cervix usually does not open or thin. Sometimes, the only way to tell if you're in true labor is for your health care provider to check your cervix. Your provider will do a physical exam and may monitor your contractions. If you're in true labor, your provider may send you home with instructions about when to return to the hospital or may send you to the hospital right away. Follow these instructions at home:  Take your medicines only as told. Drink more fluids as told. Dehydration may cause these contractions. Dehydration is when there's not enough water in your body. If Braxton Hicks contractions are making you uncomfortable: Change your position or activity. Sit and  rest in a tub of warm water. Do slow and deep breathing several times an hour. Keep all follow-up visits. Your provider will need to check your health and your baby's health. Contact a health care provider if: Your contractions become: Stronger. More regular. Closer together. You have mucus from your vagina that has blood in it. Get help right away if: You feel your baby moving less than usual. You have any amount of fluid that flows from your vagina without stopping. You have signs or symptoms of labor before 37 weeks of pregnancy, such as: Contractions that are 5 minutes or less apart, or that get stronger and last longer. Sudden, sharp pain in your belly or lower back. These symptoms may be an emergency. Call 911 right away. Do not wait to see if the symptoms will go away. Do not drive yourself to the hospital. This information is not intended to replace advice given to you by your health care provider. Make sure you discuss any questions you have with your health care provider. Document Revised: 12/15/2022 Document Reviewed: 12/15/2022 Elsevier Patient Education  2024 [employer].

## 2023-06-09 NOTE — Progress Notes (Signed)
ROB doing well, feeling good movement. She has her u/s for growth next Friday. She denies any concerns today. GBS and cultures self swab collected. Follow up 1 wk.   Doreene Burke, CNM

## 2023-06-11 LAB — STREP GP B NAA: Strep Gp B NAA: NEGATIVE

## 2023-06-16 ENCOUNTER — Ambulatory Visit (INDEPENDENT_AMBULATORY_CARE_PROVIDER_SITE_OTHER): Payer: Medicaid Other | Admitting: Certified Nurse Midwife

## 2023-06-16 ENCOUNTER — Encounter: Payer: Medicaid Other | Admitting: Certified Nurse Midwife

## 2023-06-16 VITALS — BP 131/94 | HR 89 | Wt 186.3 lb

## 2023-06-16 DIAGNOSIS — Z3A37 37 weeks gestation of pregnancy: Secondary | ICD-10-CM

## 2023-06-16 LAB — POCT URINALYSIS DIPSTICK OB
Bilirubin, UA: NEGATIVE
Blood, UA: NEGATIVE
Glucose, UA: NEGATIVE
Ketones, UA: NEGATIVE
Leukocytes, UA: NEGATIVE
Nitrite, UA: NEGATIVE
POC,PROTEIN,UA: NEGATIVE
Spec Grav, UA: 1.025 (ref 1.010–1.025)
Urobilinogen, UA: 0.2 U/dL
pH, UA: 5 (ref 5.0–8.0)

## 2023-06-16 NOTE — Progress Notes (Signed)
ROB doing well, feeling good movement. Had round of braxton hicks yesterday that resolved. Reviewed labor precautions. Pt has keratosis of her left nipple. Is concerned about breast feeding. All purpose nipple cream recommended.   Orders placed. Follow up 1 wk.   Doreene Burke, CNM

## 2023-06-16 NOTE — Addendum Note (Signed)
Addended by: Sheliah Hatch on: 06/16/2023 11:53 AM   Modules accepted: Orders

## 2023-06-18 ENCOUNTER — Other Ambulatory Visit: Payer: Self-pay | Admitting: Certified Nurse Midwife

## 2023-06-18 ENCOUNTER — Ambulatory Visit: Payer: Medicaid Other

## 2023-06-18 DIAGNOSIS — Z3A37 37 weeks gestation of pregnancy: Secondary | ICD-10-CM | POA: Diagnosis not present

## 2023-06-18 DIAGNOSIS — O26843 Uterine size-date discrepancy, third trimester: Secondary | ICD-10-CM

## 2023-06-21 ENCOUNTER — Observation Stay
Admission: EM | Admit: 2023-06-21 | Discharge: 2023-06-21 | Disposition: A | Discharge status: Home / Self Care | Payer: Medicaid Other | Attending: Certified Nurse Midwife | Admitting: Certified Nurse Midwife

## 2023-06-21 ENCOUNTER — Encounter: Payer: Self-pay | Admitting: Obstetrics

## 2023-06-21 ENCOUNTER — Other Ambulatory Visit: Payer: Self-pay

## 2023-06-21 DIAGNOSIS — Z3A37 37 weeks gestation of pregnancy: Secondary | ICD-10-CM | POA: Diagnosis not present

## 2023-06-21 DIAGNOSIS — O26893 Other specified pregnancy related conditions, third trimester: Secondary | ICD-10-CM | POA: Diagnosis not present

## 2023-06-21 DIAGNOSIS — N889 Noninflammatory disorder of cervix uteri, unspecified: Secondary | ICD-10-CM | POA: Insufficient documentation

## 2023-06-21 DIAGNOSIS — R109 Unspecified abdominal pain: Secondary | ICD-10-CM

## 2023-06-21 NOTE — OB Triage Provider Note (Signed)
      L&D OB Triage Note  SUBJECTIVE Danielle Wood is a 33 y.o. [phone] female at [redacted]w[redacted]d, EDD Estimated Date of Delivery: 07/06/23 who presented to triage with complaints of contractions. She denies loss of fluid and vaginal bleeding and is feeling good fetal movement.   OB History  Gravida Para Term Preterm AB Living  4 3 3  0 0 3  SAB IAB Ectopic Multiple Live Births  0 0 0 0 3    # Outcome Date GA Lbr Len/2nd Weight Sex Type Anes PTL Lv  4 Current           3 Term 12/13/21 [redacted]w[redacted]d / 00:13 4310 g F Vag-Spont EPI  LIV     Name: Danielle Wood     Apgar1: 7  Apgar5: 9  2 Term 11/07/19 [redacted]w[redacted]d   M Vag-Spont  N LIV  1 Term 10/03/15 [redacted]w[redacted]d  3030 g F Vag-Spont EPI N LIV     Name: Danielle Wood     Apgar1: 9  Apgar5: 9    Medications Prior to Admission  Medication Sig Dispense Refill Last Dose/Taking   Prenatal Vit-Fe Fumarate-FA (PRENATAL MULTIVITAMIN) TABS tablet Take 1 tablet by mouth daily at 12 noon.   06/20/2023   acetaminophen (TYLENOL) 325 MG tablet Take 2 tablets (650 mg total) by mouth every 4 (four) hours as needed (for pain scale < 4).      ALPRAZolam (XANAX) 0.5 MG tablet Take 0.5 mg by mouth 2 (two) times daily as needed for anxiety.      citalopram (CELEXA) 20 MG tablet Take 1 tablet (20 mg total) by mouth daily. (Patient taking differently: Take 20 mg by mouth daily.) 30 tablet 6    Iron-FA-B Cmp-C-Biot-Probiotic (FUSION PLUS) CAPS Take 1 capsule by mouth daily at 6 (six) AM. 30 capsule 11    ondansetron (ZOFRAN) 4 MG tablet Take 1 tablet (4 mg total) by mouth daily as needed for nausea or vomiting. 30 tablet 1    pantoprazole (PROTONIX) 20 MG tablet Take 1 tablet (20 mg total) by mouth daily. 30 tablet 2      OBJECTIVE  Nursing Evaluation:   BP 130/81   Pulse 70   LMP  (LMP Unknown)    Findings:   no cervical change      NST was performed and has been reviewed by me.  NST INTERPRETATION: Category I  Mode: External Baseline Rate (A): 115 bpm Variability:  Moderate Accelerations: 15 x 15 Decelerations: None     Contraction Frequency (min): 4-5  ASSESSMENT Impression:  1.  Pregnancy:  [id number] at [redacted]w[redacted]d , EDD Estimated Date of Delivery: 07/06/23 2.  Reassuring fetal and maternal status 3.  No cervical change  PLAN 1. Current condition and above findings reviewed.  Reassuring fetal and maternal condition. 2. Discharge home with standard labor precautions given to return to L&D or call the office for problems. 3. Continue routine prenatal care.  Doreene Burke, cnm

## 2023-06-21 NOTE — OB Triage Note (Signed)
Patient POV from home is seen at [facility], is G4P3 [redacted]w[redacted]d. Patient c/o CTX since 10pm tonight reports every 5 minutes. Rates them 4/10. Denies LOF, s/s UTI, and reports mucous loss. Patient FHR 115 good variability. VSS. Provider notified.

## 2023-06-23 ENCOUNTER — Ambulatory Visit: Payer: Medicaid Other | Admitting: Certified Nurse Midwife

## 2023-06-23 VITALS — BP 118/79 | HR 86 | Wt 188.0 lb

## 2023-06-23 DIAGNOSIS — F410 Panic disorder [episodic paroxysmal anxiety] without agoraphobia: Secondary | ICD-10-CM

## 2023-06-23 DIAGNOSIS — Z3A38 38 weeks gestation of pregnancy: Secondary | ICD-10-CM | POA: Diagnosis not present

## 2023-06-23 DIAGNOSIS — Z8279 Family history of other congenital malformations, deformations and chromosomal abnormalities: Secondary | ICD-10-CM

## 2023-06-23 DIAGNOSIS — O4443 Low lying placenta NOS or without hemorrhage, third trimester: Secondary | ICD-10-CM | POA: Diagnosis not present

## 2023-06-23 DIAGNOSIS — O99343 Other mental disorders complicating pregnancy, third trimester: Secondary | ICD-10-CM

## 2023-06-23 DIAGNOSIS — Z3480 Encounter for supervision of other normal pregnancy, unspecified trimester: Secondary | ICD-10-CM

## 2023-06-23 DIAGNOSIS — O4440 Low lying placenta NOS or without hemorrhage, unspecified trimester: Secondary | ICD-10-CM

## 2023-06-23 NOTE — Progress Notes (Signed)
 ROB doing well, feeling good movement. No concerns today. Labor precautions reviewed. SVE per pt request. 2-3/65/-2. Follow up 1 wk for ROB.   Alise Appl, CNM

## 2023-06-24 DIAGNOSIS — Z0289 Encounter for other administrative examinations: Secondary | ICD-10-CM

## 2023-06-29 ENCOUNTER — Inpatient Hospital Stay
Admission: EM | Admit: 2023-06-29 | Discharge: 2023-06-30 | DRG: 807 | Disposition: A | Discharge status: Home / Self Care | Payer: Medicaid Other | Attending: Certified Nurse Midwife | Admitting: Certified Nurse Midwife

## 2023-06-29 ENCOUNTER — Inpatient Hospital Stay: Payer: Medicaid Other | Admitting: General Practice

## 2023-06-29 ENCOUNTER — Encounter: Payer: Self-pay | Admitting: Obstetrics

## 2023-06-29 ENCOUNTER — Other Ambulatory Visit: Payer: Self-pay

## 2023-06-29 DIAGNOSIS — Z6791 Unspecified blood type, Rh negative: Secondary | ICD-10-CM | POA: Diagnosis not present

## 2023-06-29 DIAGNOSIS — O26893 Other specified pregnancy related conditions, third trimester: Secondary | ICD-10-CM | POA: Diagnosis present

## 2023-06-29 DIAGNOSIS — K219 Gastro-esophageal reflux disease without esophagitis: Secondary | ICD-10-CM | POA: Diagnosis present

## 2023-06-29 DIAGNOSIS — Z3480 Encounter for supervision of other normal pregnancy, unspecified trimester: Principal | ICD-10-CM

## 2023-06-29 DIAGNOSIS — O4290 Premature rupture of membranes, unspecified as to length of time between rupture and onset of labor, unspecified weeks of gestation: Secondary | ICD-10-CM | POA: Diagnosis present

## 2023-06-29 DIAGNOSIS — O4202 Full-term premature rupture of membranes, onset of labor within 24 hours of rupture: Secondary | ICD-10-CM | POA: Diagnosis not present

## 2023-06-29 DIAGNOSIS — O9962 Diseases of the digestive system complicating childbirth: Secondary | ICD-10-CM | POA: Diagnosis present

## 2023-06-29 DIAGNOSIS — Z3A39 39 weeks gestation of pregnancy: Secondary | ICD-10-CM | POA: Diagnosis not present

## 2023-06-29 DIAGNOSIS — O4292 Full-term premature rupture of membranes, unspecified as to length of time between rupture and onset of labor: Secondary | ICD-10-CM | POA: Diagnosis present

## 2023-06-29 DIAGNOSIS — R102 Pelvic and perineal pain: Secondary | ICD-10-CM

## 2023-06-29 LAB — CBC
HCT: 32.7 % — ABNORMAL LOW (ref 36.0–46.0)
Hemoglobin: 11.5 g/dL — ABNORMAL LOW (ref 12.0–15.0)
MCH: 30.7 pg (ref 26.0–34.0)
MCHC: 35.2 g/dL (ref 30.0–36.0)
MCV: 87.4 fL (ref 80.0–100.0)
Platelets: 214 10*3/uL (ref 150–400)
RBC: 3.74 MIL/uL — ABNORMAL LOW (ref 3.87–5.11)
RDW: 12.3 % (ref 11.5–15.5)
WBC: 7.2 10*3/uL (ref 4.0–10.5)
nRBC: 0 % (ref 0.0–0.2)

## 2023-06-29 LAB — TYPE AND SCREEN
ABO/RH(D): O NEG
Antibody Screen: POSITIVE

## 2023-06-29 LAB — RUPTURE OF MEMBRANE (ROM)PLUS: Rom Plus: POSITIVE

## 2023-06-29 MED ORDER — FENTANYL CITRATE (PF) 100 MCG/2ML IJ SOLN: 50.0000 ug | INTRAMUSCULAR | Status: DC | PRN

## 2023-06-29 MED ORDER — METHYLERGONOVINE MALEATE 0.2 MG/ML IJ SOLN: 0.2000 mg | INTRAMUSCULAR | Status: DC | PRN

## 2023-06-29 MED ORDER — LIDOCAINE HCL (PF) 1 % IJ SOLN
30.0000 mL | INTRAMUSCULAR | Status: DC | PRN
  Filled 2023-06-29: qty 30

## 2023-06-29 MED ORDER — FERROUS SULFATE 325 (65 FE) MG PO TABS
325.0000 mg | ORAL_TABLET | Freq: Every day | ORAL | Status: DC
Start: 2023-06-30 — End: 2023-06-30
  Administered 2023-06-30: 325 mg via ORAL
  Filled 2023-06-29: qty 1

## 2023-06-29 MED ORDER — LIDOCAINE HCL (PF) 1 % IJ SOLN
INTRAMUSCULAR | Status: DC | PRN
  Administered 2023-06-29: 2 mL via SUBCUTANEOUS

## 2023-06-29 MED ORDER — AMMONIA AROMATIC IN INHA
RESPIRATORY_TRACT | Status: AC
  Filled 2023-06-29: qty 10

## 2023-06-29 MED ORDER — LACTATED RINGERS IV SOLN
500.0000 mL | Freq: Once | INTRAVENOUS | Status: AC
  Administered 2023-06-29: 500 mL via INTRAVENOUS

## 2023-06-29 MED ORDER — DIPHENHYDRAMINE HCL 50 MG/ML IJ SOLN: 12.5000 mg | INTRAMUSCULAR | Status: DC | PRN

## 2023-06-29 MED ORDER — IBUPROFEN 600 MG PO TABS
600.0000 mg | ORAL_TABLET | Freq: Four times a day (QID) | ORAL | Status: DC
  Administered 2023-06-29 – 2023-06-30 (×2): 600 mg via ORAL
  Filled 2023-06-29 (×3): qty 1

## 2023-06-29 MED ORDER — ONDANSETRON HCL 4 MG/2ML IJ SOLN: 4.0000 mg | Freq: Four times a day (QID) | INTRAMUSCULAR | Status: DC | PRN

## 2023-06-29 MED ORDER — PHENYLEPHRINE 80 MCG/ML (10ML) SYRINGE FOR IV PUSH (FOR BLOOD PRESSURE SUPPORT): 80.0000 ug | PREFILLED_SYRINGE | INTRAVENOUS | Status: DC | PRN

## 2023-06-29 MED ORDER — DOCUSATE SODIUM 100 MG PO CAPS
100.0000 mg | ORAL_CAPSULE | Freq: Two times a day (BID) | ORAL | Status: DC
  Administered 2023-06-30: 100 mg via ORAL
  Filled 2023-06-29: qty 1

## 2023-06-29 MED ORDER — OXYTOCIN-SODIUM CHLORIDE 30-0.9 UT/500ML-% IV SOLN
1.0000 m[IU]/min | INTRAVENOUS | Status: DC
  Administered 2023-06-29: 4 m[IU]/min via INTRAVENOUS
  Filled 2023-06-29: qty 500

## 2023-06-29 MED ORDER — BENZOCAINE-MENTHOL 20-0.5 % EX AERO
1.0000 | INHALATION_SPRAY | CUTANEOUS | Status: DC | PRN
  Administered 2023-06-29: 1 via TOPICAL
  Filled 2023-06-29: qty 56

## 2023-06-29 MED ORDER — ONDANSETRON HCL 4 MG/2ML IJ SOLN: 4.0000 mg | INTRAMUSCULAR | Status: DC | PRN

## 2023-06-29 MED ORDER — LACTATED RINGERS IV SOLN: INTRAVENOUS | Status: DC

## 2023-06-29 MED ORDER — CITALOPRAM HYDROBROMIDE 20 MG PO TABS
20.0000 mg | ORAL_TABLET | Freq: Every day | ORAL | Status: DC
  Administered 2023-06-29: 20 mg via ORAL
  Filled 2023-06-29 (×2): qty 1

## 2023-06-29 MED ORDER — TERBUTALINE SULFATE 1 MG/ML IJ SOLN: 0.2500 mg | Freq: Once | INTRAMUSCULAR | Status: DC | PRN

## 2023-06-29 MED ORDER — LIDOCAINE-EPINEPHRINE (PF) 1.5 %-1:200000 IJ SOLN
INTRAMUSCULAR | Status: DC | PRN
  Administered 2023-06-29: 3 mL via PERINEURAL

## 2023-06-29 MED ORDER — OXYCODONE-ACETAMINOPHEN 5-325 MG PO TABS: 1.0000 | ORAL_TABLET | ORAL | Status: DC | PRN

## 2023-06-29 MED ORDER — BUPIVACAINE HCL (PF) 0.25 % IJ SOLN
INTRAMUSCULAR | Status: DC | PRN
  Administered 2023-06-29 (×2): 4 mL via EPIDURAL

## 2023-06-29 MED ORDER — SOD CITRATE-CITRIC ACID 500-334 MG/5ML PO SOLN: 30.0000 mL | ORAL | Status: DC | PRN

## 2023-06-29 MED ORDER — EPHEDRINE 5 MG/ML INJ
10.0000 mg | INTRAVENOUS | Status: AC | PRN
  Administered 2023-06-29 (×2): 10 mg via INTRAVENOUS

## 2023-06-29 MED ORDER — PRENATAL MULTIVITAMIN CH: 1.0000 | ORAL_TABLET | Freq: Every day | ORAL | Status: DC

## 2023-06-29 MED ORDER — COCONUT OIL OIL: 1.0000 | TOPICAL_OIL | Status: DC | PRN

## 2023-06-29 MED ORDER — OXYTOCIN-SODIUM CHLORIDE 30-0.9 UT/500ML-% IV SOLN
2.5000 [IU]/h | INTRAVENOUS | Status: DC
  Administered 2023-06-29: 2.5 [IU]/h via INTRAVENOUS

## 2023-06-29 MED ORDER — LACTATED RINGERS IV SOLN
500.0000 mL | INTRAVENOUS | Status: DC | PRN
  Administered 2023-06-29: 500 mL via INTRAVENOUS

## 2023-06-29 MED ORDER — METHYLERGONOVINE MALEATE 0.2 MG PO TABS: 0.2000 mg | ORAL_TABLET | ORAL | Status: DC | PRN

## 2023-06-29 MED ORDER — ACETAMINOPHEN 325 MG PO TABS
650.0000 mg | ORAL_TABLET | ORAL | Status: DC | PRN
  Administered 2023-06-29 – 2023-06-30 (×2): 650 mg via ORAL
  Filled 2023-06-29 (×2): qty 2

## 2023-06-29 MED ORDER — SIMETHICONE 80 MG PO CHEW: 80.0000 mg | CHEWABLE_TABLET | ORAL | Status: DC | PRN

## 2023-06-29 MED ORDER — FENTANYL-BUPIVACAINE-NACL 0.5-0.125-0.9 MG/250ML-% EP SOLN
EPIDURAL | Status: AC
  Filled 2023-06-29: qty 250

## 2023-06-29 MED ORDER — WITCH HAZEL-GLYCERIN EX PADS
1.0000 | MEDICATED_PAD | CUTANEOUS | Status: DC | PRN
  Administered 2023-06-29: 1 via TOPICAL
  Filled 2023-06-29: qty 100

## 2023-06-29 MED ORDER — ONDANSETRON HCL 4 MG PO TABS: 4.0000 mg | ORAL_TABLET | ORAL | Status: DC | PRN

## 2023-06-29 MED ORDER — OXYTOCIN 10 UNIT/ML IJ SOLN
INTRAMUSCULAR | Status: AC
Start: 2023-06-29 — End: 2023-06-30
  Filled 2023-06-29: qty 2

## 2023-06-29 MED ORDER — MISOPROSTOL 200 MCG PO TABS
ORAL_TABLET | ORAL | Status: AC
Start: 2023-06-29 — End: 2023-06-30
  Filled 2023-06-29: qty 4

## 2023-06-29 MED ORDER — OXYCODONE-ACETAMINOPHEN 5-325 MG PO TABS: 2.0000 | ORAL_TABLET | ORAL | Status: DC | PRN

## 2023-06-29 MED ORDER — DIBUCAINE (PERIANAL) 1 % EX OINT
1.0000 | TOPICAL_OINTMENT | CUTANEOUS | Status: DC | PRN
  Administered 2023-06-29: 1 via RECTAL
  Filled 2023-06-29: qty 28

## 2023-06-29 MED ORDER — OXYTOCIN BOLUS FROM INFUSION
333.0000 mL | Freq: Once | INTRAVENOUS | Status: AC
  Administered 2023-06-29: 333 mL via INTRAVENOUS

## 2023-06-29 MED ORDER — EPHEDRINE 5 MG/ML INJ
10.0000 mg | INTRAVENOUS | Status: DC | PRN
  Filled 2023-06-29: qty 5

## 2023-06-29 MED ORDER — SENNOSIDES-DOCUSATE SODIUM 8.6-50 MG PO TABS
2.0000 | ORAL_TABLET | ORAL | Status: DC
  Administered 2023-06-29: 2 via ORAL
  Filled 2023-06-29: qty 2

## 2023-06-29 MED ORDER — FENTANYL-BUPIVACAINE-NACL 0.5-0.125-0.9 MG/250ML-% EP SOLN
12.0000 mL/h | EPIDURAL | Status: DC | PRN
  Administered 2023-06-29: 12 mL/h via EPIDURAL

## 2023-06-29 NOTE — H&P (Addendum)
History and Physical   HPI  Danielle Wood is a 33 y.o. G4P3003 at [redacted]w[redacted]d Estimated Date of Delivery: 07/06/23 who states she ruptured at home approx 10:00 this morning, reports clear fluid.    OB History  OB History  Gravida Para Term Preterm AB Living  4 3 3  0 0 3  SAB IAB Ectopic Multiple Live Births  0 0 0 0 3    # Outcome Date GA Lbr Len/2nd Weight Sex Type Anes PTL Lv  4 Current           3 Term 12/13/21 [redacted]w[redacted]d / 00:13 4310 g F Vag-Spont EPI  LIV     Name: Danielle, Wood     Apgar1: 7  Apgar5: 9  2 Term 11/07/19 [redacted]w[redacted]d   M Vag-Spont  N LIV  1 Term 10/03/15 [redacted]w[redacted]d  3030 g F Vag-Spont EPI N LIV     Name: Danielle Wood     Apgar1: 9  Apgar5: 9    PROBLEM LIST  Pregnancy complications or risks: Patient Active Problem List   Diagnosis Date Noted   Labor and delivery, indication for care 06/21/2023   Pelvic pain affecting pregnancy 05/28/2023   UTI in pregnancy, first trimester 03/01/2023   Supervision of other normal pregnancy, antepartum 12/11/2022   Low-lying placenta-RESOLVED 07/27/2019   Panic attacks 07/27/2019   Family history of chromosomal abnormality 03/28/2015   Rh negative state in antepartum period, third trimester 01/30/2015    Prenatal labs and studies: ABO, Rh: O/Negative/-- (08/19 1026) Antibody: Negative (11/26 1015) Rubella: 6.20 (08/19 1026) RPR: Non Reactive (11/26 1015)  HBsAg: Negative (08/19 1026)  HIV: Non Reactive (11/26 1015)  NFA:OZHYQMVH/-- (01/22 1047)   Past Medical History:  Diagnosis Date   Anxiety      Past Surgical History:  Procedure Laterality Date   TONSILLECTOMY AND ADENOIDECTOMY N/A 01/14/2016   Procedure: TONSILLECTOMY AND ADENOIDECTOMY;  Surgeon: Linus Salmons, MD;  Location: [facility] ORS;  Service: ENT;  Laterality: N/A;   WISDOM TOOTH EXTRACTION  2010     Medications    Current Discharge Medication List     CONTINUE these medications which have NOT CHANGED   Details  acetaminophen  (TYLENOL) 325 MG tablet Take 2 tablets (650 mg total) by mouth every 4 (four) hours as needed (for pain scale < 4).    ALPRAZolam (XANAX) 0.5 MG tablet Take 0.5 mg by mouth 2 (two) times daily as needed for anxiety.    citalopram (CELEXA) 20 MG tablet Take 20 mg by mouth daily.    Iron-FA-B Cmp-C-Biot-Probiotic (FUSION PLUS) CAPS Take 1 capsule by mouth daily at 6 (six) AM. Qty: 30 capsule, Refills: 11    ondansetron (ZOFRAN) 4 MG tablet Take 1 tablet (4 mg total) by mouth daily as needed for nausea or vomiting. Qty: 30 tablet, Refills: 1   Associated Diagnoses: Nausea and vomiting in pregnancy    pantoprazole (PROTONIX) 20 MG tablet Take 1 tablet (20 mg total) by mouth daily. Qty: 30 tablet, Refills: 2    Prenatal Vit-Fe Fumarate-FA (PRENATAL MULTIVITAMIN) TABS tablet Take 1 tablet by mouth daily at 12 noon.         Allergies  Sulfamethoxazole-trimethoprim  Review of Systems  Constitutional: negative Eyes: negative Ears, nose, mouth, throat, and face: negative Respiratory: negative Cardiovascular: negative Gastrointestinal: negative Genitourinary:negative Integument/breast: negative Hematologic/lymphatic: negative Musculoskeletal:negative Neurological: negative Behavioral/Psych: negative Endocrine: negative Allergic/Immunologic: negative  Physical Exam  BP 128/77 (BP Location: Right Arm)   Pulse 85  Temp 97.7 F (36.5 C) (Oral)   Resp 18   Ht 5\' 9"  (1.753 m)   Wt 84.8 kg   LMP  (LMP Unknown)   BMI 27.62 kg/m   Lungs:  CTA B Cardio: RRR without M/R/G Abd: Soft, gravid, NT Presentation: cephalic EXT: No C/C/ 1+ Edema DTRs: 2+ B CERVIX: Dilation: 3 Effacement (%): 70 Cervical Position: Anterior Station: -2 Presentation: Vertex Exam by:: A Thompsn CNM  See Prenatal records for more detailed PE.     FHR:  Baseline: 125 bpm, Variability: Good {> 6 bpm), Accelerations: Reactive, and Decelerations: Absent  Toco: Uterine Contractions:  irritability  Test Results  Results for orders placed or performed during the hospital encounter of 06/29/23 (from the past 24 hours)  Rupture of Membrane (ROM) Plus     Status: None   Collection Time: 06/29/23 10:12 AM  Result Value Ref Range   Rom Plus POSITIVE   CBC     Status: Abnormal   Collection Time: 06/29/23 11:55 AM  Result Value Ref Range   WBC 7.2 4.0 - 10.5 K/uL   RBC 3.74 (L) 3.87 - 5.11 MIL/uL   Hemoglobin 11.5 (L) 12.0 - 15.0 g/dL   HCT 81.1 (L) 91.4 - 78.2 %   MCV 87.4 80.0 - 100.0 fL   MCH 30.7 26.0 - 34.0 pg   MCHC 35.2 30.0 - 36.0 g/dL   RDW 95.6 21.3 - 08.6 %   Platelets 214 150 - 400 K/uL   nRBC 0.0 0.0 - 0.2 %   Group B Strep negative  Assessment   G4P3003 at [redacted]w[redacted]d Estimated Date of Delivery: 07/06/23 being admitted for PROM.  The fetus is reassuring.   Patient Active Problem List   Diagnosis Date Noted   Labor and delivery, indication for care 06/21/2023   Pelvic pain affecting pregnancy 05/28/2023   UTI in pregnancy, first trimester 03/01/2023   Supervision of other normal pregnancy, antepartum 12/11/2022   Low-lying placenta-RESOLVED 07/27/2019   Panic attacks 07/27/2019   Family history of chromosomal abnormality 03/28/2015   Rh negative state in antepartum period, third trimester 01/30/2015    Plan  1. Admit to L&D :   2. EFM:-- Category 1 3. IV pain medication or Epidural if desired.   4. Admission labs completed 5. Pitocin augmentation for PROM, not contracting 6.. Anticipate NSVD 7.. Dr. Lonny Prude notified of admission and plan of care  Doreene Burke, CNM  06/29/2023 12:20 PM

## 2023-06-29 NOTE — Discharge Summary (Signed)
Postpartum Discharge Summary  Date of Service updated***     Patient Name: Danielle Wood DOB: June 19, 1990 MRN: [id number]  Date of admission: 06/29/2023 Delivery date:06/29/2023 Delivering provider: Doreene Burke Date of discharge: 06/29/2023  Admitting diagnosis: Labor and delivery, indication for care [O75.9] Intrauterine pregnancy: [redacted]w[redacted]d     Secondary diagnosis:  Principal Problem:   PROM (premature rupture of membranes) Active Problems:   Rh negative state in antepartum period, third trimester   Supervision of other normal pregnancy, antepartum   Labor and delivery, indication for care  Additional problems: none    Discharge diagnosis: Term Pregnancy Delivered                                              Post partum procedures:{Postpartum procedures:23558} Augmentation: Pitocin Complications: None  Hospital course: PROM, augmentation of labor with pitocin  Magnesium Sulfate received: No BMZ received: No Rhophylac:{Rhophylac received:30440032} MMR:No T-DaP:Given prenatally Flu: N/A RSV Vaccine received: No Transfusion:No Immunizations administered: Immunization History  Administered Date(s) Administered   Influenza,inj,Quad PF,6+ Mos 03/30/2017, 04/05/2018   Tdap 07/04/2015, 08/23/2019, 09/23/2021, 04/13/2023    Physical exam  Vitals:   06/29/23 1510 06/29/23 1511 06/29/23 1526 06/29/23 1543  BP:  111/74 (!) 132/93 121/71  Pulse:  92 83 82  Resp:    18  Temp:    98 F (36.7 C)  TempSrc:    Oral  SpO2: 96%  100%   Weight:      Height:       General: {Exam; general:21111117} Lochia: {Desc; appropriate/inappropriate:30686::"appropriate"} Uterine Fundus: {Desc; firm/soft:30687} Incision: {Exam; incision:21111123} DVT Evaluation: {Exam; dvt:2111122} Labs: Lab Results  Component Value Date   WBC 7.2 06/29/2023   HGB 11.5 (L) 06/29/2023   HCT 32.7 (L) 06/29/2023   MCV 87.4 06/29/2023   PLT 214 06/29/2023      Latest Ref Rng & Units  05/28/2023   11:44 AM  CMP  Glucose 70 - 99 mg/dL 409   BUN 6 - 20 mg/dL 4   Creatinine 8.11 - 9.14 mg/dL 7.82   Sodium 956 - 213 mmol/L 137   Potassium 3.5 - 5.2 mmol/L 3.8   Chloride 96 - 106 mmol/L 104   CO2 20 - 29 mmol/L 19   Calcium 8.7 - 10.2 mg/dL 8.2   Total Protein 6.0 - 8.5 g/dL 5.7   Total Bilirubin 0.0 - 1.2 mg/dL 0.5   Alkaline Phos 44 - 121 IU/L 78   AST 0 - 40 IU/L 16   ALT 0 - 32 IU/L 9    Edinburgh Score:    04/13/2023   10:16 AM  Edinburgh Postnatal Depression Scale Screening Tool  I have been able to laugh and see the funny side of things. 0  I have looked forward with enjoyment to things. 0  I have blamed myself unnecessarily when things went wrong. 1  I have been anxious or worried for no good reason. 2  I have felt scared or panicky for no good reason. 0  Things have been getting on top of me. 1  I have been so unhappy that I have had difficulty sleeping. 0  I have felt sad or miserable. 1  I have been so unhappy that I have been crying. 0  The thought of harming myself has occurred to me. 0  Edinburgh Postnatal Depression Scale Total 5  After visit meds:  Allergies as of 06/29/2023       Reactions   Sulfamethoxazole-trimethoprim Other (See Comments)   Fever/chills/flu like symptoms     Med Rec must be completed prior to using this [facility]***        Discharge home in stable condition Infant Feeding:  pumping Infant Disposition:{CHL IP OB HOME WITH ZOXWRU:04540} Discharge instruction: per After Visit Summary and Postpartum booklet. Activity: Advance as tolerated. Pelvic rest for 6 weeks.  Diet: routine diet Anticipated Birth Control:  vasectomy  Postpartum Appointment: 2 weeks  Additional Postpartum F/U: Postpartum Depression checkup Future Appointments: Future Appointments  Date Time Provider Department Center  07/01/2023 11:15 AM Doreene Burke, CNM [facility] None   Follow up Visit:      06/29/2023 Doreene Burke,  CNM

## 2023-06-29 NOTE — Anesthesia Procedure Notes (Signed)
Epidural Patient location during procedure: OB Start time: 06/29/2023 2:26 PM End time: 06/29/2023 2:33 PM  Staffing Anesthesiologist: Stephanie Coup, MD Resident/CRNA: Irving Burton, CRNA Performed: resident/CRNA   Preanesthetic Checklist Completed: patient identified, IV checked, site marked, risks and benefits discussed, surgical consent, monitors and equipment checked, pre-op evaluation and timeout performed  Epidural Patient position: sitting Prep: ChloraPrep Patient monitoring: heart rate, continuous pulse ox and blood pressure Approach: midline Location: L3-L4 Injection technique: LOR air  Needle:  Needle type: Tuohy  Needle gauge: 17 G Needle length: 9 cm and 9 Needle insertion depth: 6 cm Catheter type: closed end flexible Catheter size: 19 Gauge Catheter at skin depth: 11 cm Test dose: negative and 1.5% lidocaine with Epi 1:200 K  Assessment Sensory level: T10 Events: blood not aspirated, no cerebrospinal fluid, injection not painful, no injection resistance, no paresthesia and negative IV test  Additional Notes 1 attempt Pt. Evaluated and documentation done after procedure finished. Patient identified. Risks/Benefits/Options discussed with patient including but not limited to bleeding, infection, nerve damage, paralysis, failed block, incomplete pain control, headache, blood pressure changes, nausea, vomiting, reactions to medication both or allergic, itching and postpartum back pain. Confirmed with bedside nurse the patient's most recent platelet count. Confirmed with patient that they are not currently taking any anticoagulation, have any bleeding history or any family history of bleeding disorders. Patient expressed understanding and wished to proceed. All questions were answered. Sterile technique was used throughout the entire procedure. Please see nursing notes for vital signs. Test dose was given through epidural catheter and negative prior to continuing to  dose epidural or start infusion. Warning signs of high block given to the patient including shortness of breath, tingling/numbness in hands, complete motor block, or any concerning symptoms with instructions to call for help. Patient was given instructions on fall risk and not to get out of bed. All questions and concerns addressed with instructions to call with any issues or inadequate analgesia.    Patient tolerated the insertion well without immediate complications.Reason for block:procedure for pain

## 2023-06-29 NOTE — Progress Notes (Signed)
Pt presents to L/D triage with reported increased vaginal pressure and intermittent contractions during the night. Pt reported no bleeding and positive fetal movement.  NST performed- reactive strip. CNM reviewed. No CTX noted.  SVE 3/60-70/-2.  Pt reports feeling a gush of fluid while walking. CNM notified. ROM+ performed and pending.  Plan to recheck pt for labor eval.

## 2023-06-29 NOTE — Anesthesia Preprocedure Evaluation (Signed)
Anesthesia Evaluation  Patient identified by MRN, date of birth, ID band Patient awake    Reviewed: Allergy & Precautions, H&P , NPO status , Patient's Chart, lab work & pertinent test results  History of Anesthesia Complications Negative for: history of anesthetic complications  Airway Mallampati: II       Dental no notable dental hx.    Pulmonary neg pulmonary ROS          Cardiovascular negative cardio ROS      Neuro/Psych  PSYCHIATRIC DISORDERS Anxiety     negative neurological ROS     GI/Hepatic Neg liver ROS,GERD  ,,  Endo/Other  negative endocrine ROS    Renal/GU negative Renal ROS     Musculoskeletal negative musculoskeletal ROS (+)    Abdominal   Peds  Hematology negative hematology ROS (+)   Anesthesia Other Findings Past Medical History: No date: Anxiety   Reproductive/Obstetrics (+) Pregnancy                              Anesthesia Physical Anesthesia Plan  ASA: 2  Anesthesia Plan: Epidural   Post-op Pain Management:    Induction: Intravenous  PONV Risk Score and Plan:   Airway Management Planned: Oral ETT  Additional Equipment:   Intra-op Plan:   Post-operative Plan:   Informed Consent: I have reviewed the patients History and Physical, chart, labs and discussed the procedure including the risks, benefits and alternatives for the proposed anesthesia with the patient or authorized representative who has indicated his/her understanding and acceptance.       Plan Discussed with: Anesthesiologist  Anesthesia Plan Comments:          Anesthesia Quick Evaluation

## 2023-06-29 NOTE — Progress Notes (Signed)
LABOR NOTE   Danielle Wood QMVHQION 33 y.o.GP@ at [redacted]w[redacted]d  SUBJECTIVE:  Feeling mild cramping with contractions Analgesia: Labor support without medications  OBJECTIVE:  BP 128/77 (BP Location: Right Arm)   Pulse 85   Temp 97.7 F (36.5 C) (Oral)   Resp 18   Ht 5\' 9"  (1.753 m)   Wt 84.8 kg   LMP  (LMP Unknown)   BMI 27.62 kg/m  No intake/output data recorded.   CERVIX: deferred  SVE:   Dilation: 3 Effacement (%): 70 Station: -2 Exam by:: A Thompsn CNM CONTRACTIONS: regular, every 2 minutes FHR: Fetal heart tracing reviewed. Baseline: 120 bpm, Variability: Good {> 6 bpm), Accelerations: Reactive, and Decelerations: Early Category I    Labs: Lab Results  Component Value Date   WBC 7.2 06/29/2023   HGB 11.5 (L) 06/29/2023   HCT 32.7 (L) 06/29/2023   MCV 87.4 06/29/2023   PLT 214 06/29/2023    ASSESSMENT: 1) Labor curve reviewed.       Progress: prom, clear fluid and Early latent labor.     Membranes: ruptured, clear fluid           Active Problems:   Labor and delivery, indication for care   PLAN: continue present management and increase Pitocin rate as needed   Doreene Burke, CNM  06/29/2023 1:40 PM

## 2023-06-30 LAB — CBC
HCT: 28.8 % — ABNORMAL LOW (ref 36.0–46.0)
Hemoglobin: 10.4 g/dL — ABNORMAL LOW (ref 12.0–15.0)
MCH: 31.9 pg (ref 26.0–34.0)
MCHC: 36.1 g/dL — ABNORMAL HIGH (ref 30.0–36.0)
MCV: 88.3 fL (ref 80.0–100.0)
Platelets: 197 10*3/uL (ref 150–400)
RBC: 3.26 MIL/uL — ABNORMAL LOW (ref 3.87–5.11)
RDW: 12.3 % (ref 11.5–15.5)
WBC: 7.7 10*3/uL (ref 4.0–10.5)
nRBC: 0 % (ref 0.0–0.2)

## 2023-06-30 LAB — FETAL SCREEN: Fetal Screen: NEGATIVE

## 2023-06-30 LAB — RPR: RPR Ser Ql: NONREACTIVE

## 2023-06-30 MED ORDER — RHO D IMMUNE GLOBULIN 1500 UNIT/2ML IJ SOSY
300.0000 ug | PREFILLED_SYRINGE | Freq: Once | INTRAMUSCULAR | Status: AC
  Administered 2023-06-30: 300 ug via INTRAVENOUS
  Filled 2023-06-30: qty 2

## 2023-06-30 MED ORDER — MEDROXYPROGESTERONE ACETATE 150 MG/ML IM SUSP
150.0000 mg | Freq: Once | INTRAMUSCULAR | Status: DC
  Filled 2023-06-30: qty 1

## 2023-06-30 NOTE — Lactation Note (Signed)
This note was copied from a baby's chart. Lactation Consultation Note  Patient Name: Danielle Wood ZOXWR'U Date: 06/30/2023 Age:34 hours Reason for consult: Initial assessment;Term   Maternal Data Does the patient have breastfeeding experience prior to this delivery?: Yes (Exclusively Pumped) How long did the patient breastfeed?: 10months max  Initial assessment w/a P4 patient and a 20hr old baby Danielle.  This was SVD.  Patient w/ a hx of anxiety .  This is a experienced breastfeeding parent.  Patient stated that she exclusively breastfeed.  Currently, she is not stimulating breast and is supplementing until her breastmilk "comes in".  Patient stated she wanted to relax.   Feeding Mother's Current Feeding Choice: Breast Milk and Formula  Interventions Interventions: Breast feeding basics reviewed;Education  LC reviewed the importance stimulating breast 8-12x w/in a 24hr period.   Discharge Discharge Education: Engorgement and breast care;Outpatient recommendation Pump: Personal;Hands Danielle Wood;DEBP (Spectra and a Hands Danielle Wood Pump) WIC Program: No  Education on engorgement prevention/treatment was discussed as well as breastmilk storage guidelines.  LC provided patient with a handout on breastmilk storage guidelines from [facility]. [facility] outpatient lactation services phone number written on the white board in the room.  Patient verbalized understanding   Consult Status Consult Status: PRN    Yvette Rack Chong Wojdyla 06/30/2023, 10:41 AM

## 2023-06-30 NOTE — Anesthesia Postprocedure Evaluation (Signed)
Anesthesia Post Note  Patient: Danielle Wood  Procedure(s) Performed: AN AD HOC LABOR EPIDURAL  Patient location during evaluation: Mother Baby Anesthesia Type: Epidural Level of consciousness: awake and alert and oriented Pain management: pain level controlled Vital Signs Assessment: post-procedure vital signs reviewed and stable Respiratory status: spontaneous breathing and respiratory function stable Cardiovascular status: blood pressure returned to baseline Postop Assessment: no headache, no backache, no apparent nausea or vomiting, adequate PO intake and able to ambulate Anesthetic complications: no   No notable events documented.   Last Vitals:  Vitals:   06/30/23 0420 06/30/23 0800  BP: (!) 124/93 127/87  Pulse: 66 76  Resp:  18  Temp:    SpO2:  100%    Last Pain:  Vitals:   06/30/23 0423  TempSrc:   PainSc: 3                  Sharena Dibenedetto D Hildred Mollica

## 2023-06-30 NOTE — Final Progress Note (Signed)
Final Progress Note  Patient ID: Danielle Wood MRN: [id number] DOB/AGE: 1990-08-08 33 y.o.  Admit date: 06/29/2023 Admitting provider: Doreene Burke, CNM Discharge date: 06/30/2023   Admission Diagnoses: Prom   Discharge Diagnoses:  Principal Problem:   PROM (premature rupture of membranes) Active Problems:   Rh negative state in antepartum period, third trimester   Supervision of other normal pregnancy, antepartum   Labor and delivery, indication for care    Consults: None  Significant Findings/ Diagnostic Studies: Pt had 2 elevated blood pressures early this morning. PT states she was having signifigant pain at the time due to cramping. She states she passed a large clots the cramping has now decreased and Bps are have been in normal range since. Will follow up in office on Friday for BP check.   Procedures: none  Discharge Condition: good  Disposition: Discharge disposition: 01-Home or Self Care       Diet: Regular diet  Discharge Activity: No lifting, driving, or strenuous exercise for 2 weeks   Allergies as of 06/30/2023       Reactions   Sulfamethoxazole-trimethoprim Other (See Comments)   Fever/chills/flu like symptoms        Medication List     TAKE these medications    acetaminophen 325 MG tablet Commonly known as: Tylenol Take 2 tablets (650 mg total) by mouth every 4 (four) hours as needed (for pain scale < 4).   ALPRAZolam 0.5 MG tablet Commonly known as: XANAX Take 0.5 mg by mouth 2 (two) times daily as needed for anxiety.   citalopram 20 MG tablet Commonly known as: CELEXA Take 20 mg by mouth daily.   citalopram 20 MG tablet Commonly known as: CeleXA Take 1 tablet (20 mg total) by mouth daily.   Fusion Plus Caps Take 1 capsule by mouth daily at 6 (six) AM.   ondansetron 4 MG tablet Commonly known as: Zofran Take 1 tablet (4 mg total) by mouth daily as needed for nausea or vomiting.   pantoprazole 20 MG tablet Commonly  known as: Protonix Take 1 tablet (20 mg total) by mouth daily.   prenatal multivitamin Tabs tablet Take 1 tablet by mouth daily at 12 noon.         Total time spent taking care of this patient: 15 minutes  Signed: Doreene Burke 06/30/2023, 11:14 AM

## 2023-06-30 NOTE — Progress Notes (Signed)
Patient discharged home with family. Discharge instructions, when to follow up, and prescriptions reviewed with patient. Patient verbalized understanding. Patient will be escorted out by auxiliary.

## 2023-07-01 ENCOUNTER — Encounter: Payer: Medicaid Other | Admitting: Certified Nurse Midwife

## 2023-07-01 LAB — RHOGAM INJECTION: Unit division: 0

## 2023-07-02 ENCOUNTER — Ambulatory Visit: Payer: Medicaid Other

## 2023-07-02 VITALS — BP 132/86 | HR 86 | Wt 178.7 lb

## 2023-07-02 DIAGNOSIS — R030 Elevated blood-pressure reading, without diagnosis of hypertension: Secondary | ICD-10-CM

## 2023-07-02 DIAGNOSIS — Z0131 Encounter for examination of blood pressure with abnormal findings: Secondary | ICD-10-CM

## 2023-07-02 NOTE — Progress Notes (Signed)
    NURSE VISIT NOTE  Subjective:    Patient ID: Danielle Wood, female    DOB: 02-10-1991, 33 y.o.   MRN: [id number]  HPI  Patient is a 33 y.o. G42P4004 female who presents for BP check per order from Doreene Burke, CNM. According to L&D notes patient had elevated blood pressure on 06/30/2023 ) readings: 128/95, 124/93  Patient is not on prescribed medication at this time. Patient denies chest pain, shortness of breath,headache  or blurred vision  BP Readings from Last 3 Encounters:  07/02/23 132/86  06/30/23 127/87  06/23/23 118/79   Pulse Readings from Last 3 Encounters:  07/02/23 86  06/30/23 76  06/23/23 86    Objective:    BP 132/86   Pulse 86   Wt 178 lb 11.2 oz (81.1 kg)   LMP  (LMP Unknown)   Breastfeeding Yes   BMI 26.39 kg/m   Assessment:   1. Elevated blood pressure reading      Plan:   Per Dr. Carie Caddy, CNM:  Continue to monitor blood pressure at home. Report any reading >140/90 or with any associated symptoms. Return to clinic as scheduled.  Patient verbalized understanding of instructions.   Fonda Kinder, CMA

## 2023-07-02 NOTE — Patient Instructions (Signed)
Managing Your Hypertension Hypertension, also called high blood pressure, is when the force of the blood pressing against the walls of the arteries is too strong. Arteries are blood vessels that carry blood from your heart throughout your body. Hypertension forces the heart to work harder to pump blood and may cause the arteries to become narrow or stiff. Understanding blood pressure readings A blood pressure reading includes a higher number over a lower number: The first, or top, number is called the systolic pressure. It is a measure of the pressure in your arteries as your heart beats. The second, or bottom number, is called the diastolic pressure. It is a measure of the pressure in your arteries as the heart relaxes. For most people, a normal blood pressure is below 120/80. Your personal target blood pressure may vary depending on your medical conditions, your age, and other factors. Blood pressure is classified into four stages. Based on your blood pressure reading, your health care provider may use the following stages to determine what type of treatment you need, if any. Systolic pressure and diastolic pressure are measured in a unit called millimeters of mercury (mmHg). Normal Systolic pressure: below 120. Diastolic pressure: below 80. Elevated Systolic pressure: 120-129. Diastolic pressure: below 80. Hypertension stage 1 Systolic pressure: 130-139. Diastolic pressure: 80-89. Hypertension stage 2 Systolic pressure: 140 or above. Diastolic pressure: 90 or above. How can this condition affect me? Managing your hypertension is very important. Over time, hypertension can damage the arteries and decrease blood flow to parts of the body, including the brain, heart, and kidneys. Having untreated or uncontrolled hypertension can lead to: A heart attack. A stroke. A weakened blood vessel (aneurysm). Heart failure. Kidney damage. Eye damage. Memory and concentration problems. Vascular  dementia. What actions can I take to manage this condition? Hypertension can be managed by making lifestyle changes and possibly by taking medicines. Your health care provider will help you make a plan to bring your blood pressure within a normal range. You may be referred for counseling on a healthy diet and physical activity. Nutrition  Eat a diet that is high in fiber and potassium, and low in salt (sodium), added sugar, and fat. An example eating plan is called the DASH diet. DASH stands for Dietary Approaches to Stop Hypertension. To eat this way: Eat plenty of fresh fruits and vegetables. Try to fill one-half of your plate at each meal with fruits and vegetables. Eat whole grains, such as whole-wheat pasta, brown rice, or whole-grain bread. Fill about one-fourth of your plate with whole grains. Eat low-fat dairy products. Avoid fatty cuts of meat, processed or cured meats, and poultry with skin. Fill about one-fourth of your plate with lean proteins such as fish, chicken without skin, beans, eggs, and tofu. Avoid pre-made and processed foods. These tend to be higher in sodium, added sugar, and fat. Reduce your daily sodium intake. Many people with hypertension should eat less than 1,500 mg of sodium a day. Lifestyle  Work with your health care provider to maintain a healthy body weight or to lose weight. Ask what an ideal weight is for you. Get at least 30 minutes of exercise that causes your heart to beat faster (aerobic exercise) most days of the week. Activities may include walking, swimming, or biking. Include exercise to strengthen your muscles (resistance exercise), such as weight lifting, as part of your weekly exercise routine. Try to do these types of exercises for 30 minutes at least 3 days a week. Do  not use any products that contain nicotine or tobacco. These products include cigarettes, chewing tobacco, and vaping devices, such as e-cigarettes. If you need help quitting, ask your  health care provider. Control any long-term (chronic) conditions you have, such as high cholesterol or diabetes. Identify your sources of stress and find ways to manage stress. This may include meditation, deep breathing, or making time for fun activities. Alcohol use Do not drink alcohol if: Your health care provider tells you not to drink. You are pregnant, may be pregnant, or are planning to become pregnant. If you drink alcohol: Limit how much you have to: 0-1 drink a day for women. 0-2 drinks a day for men. Know how much alcohol is in your drink. In the [place] one drink equals one 12 oz bottle of beer (355 mL), one 5 oz glass of wine (148 mL), or one 1 oz glass of hard liquor (44 mL). Medicines Your health care provider may prescribe medicine if lifestyle changes are not enough to get your blood pressure under control and if: Your systolic blood pressure is 130 or higher. Your diastolic blood pressure is 80 or higher. Take medicines only as told by your health care provider. Follow the directions carefully. Blood pressure medicines must be taken as told by your health care provider. The medicine does not work as well when you skip doses. Skipping doses also puts you at risk for problems. Monitoring Before you monitor your blood pressure: Do not smoke, drink caffeinated beverages, or exercise within 30 minutes before taking a measurement. Use the bathroom and empty your bladder (urinate). Sit quietly for at least 5 minutes before taking measurements. Monitor your blood pressure at home as told by your health care provider. To do this: Sit with your back straight and supported. Place your feet flat on the floor. Do not cross your legs. Support your arm on a flat surface, such as a table. Make sure your upper arm is at heart level. Each time you measure, take two or three readings one minute apart and record the results. You may also need to have your blood pressure checked regularly by  your health care provider. General information Talk with your health care provider about your diet, exercise habits, and other lifestyle factors that may be contributing to hypertension. Review all the medicines you take with your health care provider because there may be side effects or interactions. Keep all follow-up visits. Your health care provider can help you create and adjust your plan for managing your high blood pressure. Where to find more information National Heart, Lung, and Blood Institute: [employer].is American Heart Association: www.heart.org Contact a health care provider if: You think you are having a reaction to medicines you have taken. You have repeated (recurrent) headaches. You feel dizzy. You have swelling in your ankles. You have trouble with your vision. Get help right away if: You develop a severe headache or confusion. You have unusual weakness or numbness, or you feel faint. You have severe pain in your chest or abdomen. You vomit repeatedly. You have trouble breathing. These symptoms may be an emergency. Get help right away. Call 911. Do not wait to see if the symptoms will go away. Do not drive yourself to the hospital. Summary Hypertension is when the force of blood pumping through your arteries is too strong. If this condition is not controlled, it may put you at risk for serious complications. Your personal target blood pressure may vary depending on your medical conditions,  your age, and other factors. For most people, a normal blood pressure is less than 120/80. Hypertension is managed by lifestyle changes, medicines, or both. Lifestyle changes to help manage hypertension include losing weight, eating a healthy, low-sodium diet, exercising more, stopping smoking, and limiting alcohol. This information is not intended to replace advice given to you by your health care provider. Make sure you discuss any questions you have with your health care  provider. Document Revised: 01/16/2021 Document Reviewed: 01/16/2021 Elsevier Patient Education  2024 [employer].

## 2023-07-14 ENCOUNTER — Telehealth (INDEPENDENT_AMBULATORY_CARE_PROVIDER_SITE_OTHER): Payer: Medicaid Other | Admitting: Certified Nurse Midwife

## 2023-07-14 DIAGNOSIS — Z1332 Encounter for screening for maternal depression: Secondary | ICD-10-CM | POA: Diagnosis not present

## 2023-07-14 DIAGNOSIS — Z1331 Encounter for screening for depression: Secondary | ICD-10-CM

## 2023-07-14 NOTE — Progress Notes (Signed)
 Edinburgh Postnatal Depression Scale - 07/14/23 1458       Edinburgh Postnatal Depression Scale:  In the Past 7 Days   I have been able to laugh and see the funny side of things. 0    I have looked forward with enjoyment to things. 0    I have blamed myself unnecessarily when things went wrong. 1    I have been anxious or worried for no good reason. 2    I have felt scared or panicky for no good reason. 0    Things have been getting on top of me. 0    I have been so unhappy that I have had difficulty sleeping. 0    I have felt sad or miserable. 1    I have been so unhappy that I have been crying. 0    The thought of harming myself has occurred to me. 0    Edinburgh Postnatal Depression Scale Total 4

## 2023-07-14 NOTE — Progress Notes (Signed)
 Virtual Visit via Video Note  I connected with Danielle Wood on 07/14/23 at  3:15 PM EST by a video enabled telemedicine application and verified that I am speaking with the correct person using two identifiers.  Location: Patient: at home Provider: in the office   I discussed the limitations of evaluation and management by telemedicine and the availability of in person appointments. The patient expressed understanding and agreed to proceed.  History of Present Illness: G4P4 status post SVD 06/29/2023 at 39 wks   Observations/Objective: Doing well postpartum. Had not had any elevated blood pressures at home. Bleeding is slowing down. She denies pain . She is pumping breast milk. Overall mood is ok, she has her moments . Can some times be challenging with 4 children but she has help.   Assessment and Plan:  Edinburgh Postnatal Depression Scale - 07/14/23 1458       Edinburgh Postnatal Depression Scale:  In the Past 7 Days   I have been able to laugh and see the funny side of things. 0    I have looked forward with enjoyment to things. 0    I have blamed myself unnecessarily when things went wrong. 1    I have been anxious or worried for no good reason. 2    I have felt scared or panicky for no good reason. 0    Things have been getting on top of me. 0    I have been so unhappy that I have had difficulty sleeping. 0    I have felt sad or miserable. 1    I have been so unhappy that I have been crying. 0    The thought of harming myself has occurred to me. 0    Edinburgh Postnatal Depression Scale Total 4             No change in score since delivery.  She is currently taking her celexa and denies need for medication change at this time.   Follow Up Instructions: In 4 weeks in the office .    I discussed the assessment and treatment plan with the patient. The patient was provided an opportunity to ask questions and all were answered. The patient agreed with the plan and  demonstrated an understanding of the instructions.   The patient was advised to call back or seek an in-person evaluation if the symptoms worsen or if the condition fails to improve as anticipated.  I provided 10 minutes of non-face-to-face time during this encounter.   Doreene Burke, CNM

## 2023-08-20 ENCOUNTER — Other Ambulatory Visit: Payer: Self-pay | Admitting: Certified Nurse Midwife

## 2023-10-13 ENCOUNTER — Telehealth: Payer: Self-pay | Admitting: Lactation Services

## 2023-10-13 ENCOUNTER — Telehealth: Payer: Self-pay

## 2023-10-13 ENCOUNTER — Telehealth: Admitting: Physician Assistant

## 2023-10-13 DIAGNOSIS — N61 Mastitis without abscess: Secondary | ICD-10-CM

## 2023-10-13 DIAGNOSIS — N644 Mastodynia: Secondary | ICD-10-CM

## 2023-10-13 MED ORDER — DICLOXACILLIN SODIUM 500 MG PO CAPS: 500.0000 mg | ORAL_CAPSULE | Freq: Four times a day (QID) | ORAL | 0 refills | Status: DC

## 2023-10-13 NOTE — Progress Notes (Signed)
   Thank you for the details you included in the comment boxes. Those details are very helpful in determining the best course of treatment for you and help us  to provide the best care.Because we cannot evaluate or treat mastitis via e-visit, we recommend that you schedule a Virtual Urgent Care video visit in order for the provider to better assess what is going on.  The provider will be able to give you a more accurate diagnosis and treatment plan if we can more freely discuss your symptoms and with the addition of a virtual examination.   If you change your visit to a video visit, we will bill your insurance (similar to an office visit) and you will not be charged for this e-Visit. You will be able to stay at home and speak with the first available [facility] advanced practice provider. The link to do a video visit is in the drop down Menu tab of your Welcome screen in MyChart.

## 2023-10-13 NOTE — Telephone Encounter (Signed)
 LC called patient and spoke to both grandfather and husband of Danielle Wood.  They stated that she was in lots of pain in her breast and she wanted to know what she should do.  Patient has had a fever for a couple of days, but the breast pain just started today.  Breast is red, w/ hardness underneath rt breast.  She stated that she is pumping but not getting very much milk out.   LC provided some treatment plans for what to do.    Recommendations: - Ice on breast 15-20 minutes prior to pumping - gentle massage of lymphatic drainage away from nipple. - Breast gymnastics to help move stagnant milk - Pump starting on gentle cycle - Call provider to inform them

## 2023-10-13 NOTE — Telephone Encounter (Signed)
   Assessment: -    Patient is postpartum, is Breastfeeding. reports fever Pain/redness of breast tissue , fever/chills, and fatigue,,    Fever >100.5 Yes- 102.7 an hour after Tylenol    Plan (Mastitis above symptoms) -  Continue to feed on baby's schedule., Ensure proper latching.  , Continue to feed on both breasts.  , Tylenol  or Advil  for discomfort.  , Place cold compress to the affected area   , Increase PO fluids and rest , and Contact office if signs and symptoms worsen or no improvement after 24-48 hours of antibiotics    Allergies Verified Yes   Treatment: Dicloxacillin  500 mg po 4 times daily x 10 days

## 2023-10-18 ENCOUNTER — Encounter: Payer: Self-pay | Admitting: *Deleted

## 2023-10-18 ENCOUNTER — Other Ambulatory Visit: Payer: Self-pay

## 2023-10-18 ENCOUNTER — Inpatient Hospital Stay
Admission: EM | Admit: 2023-10-18 | Discharge: 2023-10-21 | DRG: 872 | Disposition: A | Discharge status: Home / Self Care | Attending: Internal Medicine | Admitting: Internal Medicine

## 2023-10-18 DIAGNOSIS — Z79899 Other long term (current) drug therapy: Secondary | ICD-10-CM | POA: Diagnosis not present

## 2023-10-18 DIAGNOSIS — E2740 Unspecified adrenocortical insufficiency: Secondary | ICD-10-CM | POA: Diagnosis present

## 2023-10-18 DIAGNOSIS — N61 Mastitis without abscess: Secondary | ICD-10-CM | POA: Diagnosis not present

## 2023-10-18 DIAGNOSIS — O9123 Nonpurulent mastitis associated with lactation: Secondary | ICD-10-CM | POA: Diagnosis present

## 2023-10-18 DIAGNOSIS — F419 Anxiety disorder, unspecified: Secondary | ICD-10-CM | POA: Diagnosis present

## 2023-10-18 DIAGNOSIS — R0682 Tachypnea, not elsewhere classified: Secondary | ICD-10-CM | POA: Diagnosis present

## 2023-10-18 DIAGNOSIS — L0390 Cellulitis, unspecified: Secondary | ICD-10-CM | POA: Diagnosis not present

## 2023-10-18 DIAGNOSIS — F32A Depression, unspecified: Secondary | ICD-10-CM | POA: Diagnosis present

## 2023-10-18 DIAGNOSIS — L03313 Cellulitis of chest wall: Secondary | ICD-10-CM

## 2023-10-18 DIAGNOSIS — K219 Gastro-esophageal reflux disease without esophagitis: Secondary | ICD-10-CM | POA: Diagnosis present

## 2023-10-18 DIAGNOSIS — E876 Hypokalemia: Secondary | ICD-10-CM | POA: Diagnosis present

## 2023-10-18 DIAGNOSIS — A419 Sepsis, unspecified organism: Principal | ICD-10-CM | POA: Diagnosis present

## 2023-10-18 DIAGNOSIS — Z8249 Family history of ischemic heart disease and other diseases of the circulatory system: Secondary | ICD-10-CM | POA: Diagnosis not present

## 2023-10-18 DIAGNOSIS — N644 Mastodynia: Secondary | ICD-10-CM | POA: Diagnosis present

## 2023-10-18 DIAGNOSIS — N6123 Granulomatous mastitis, bilateral breast: Principal | ICD-10-CM

## 2023-10-18 LAB — BASIC METABOLIC PANEL WITH GFR
Anion gap: 10 (ref 5–15)
BUN: 11 mg/dL (ref 6–20)
CO2: 22 mmol/L (ref 22–32)
Calcium: 8.5 mg/dL — ABNORMAL LOW (ref 8.9–10.3)
Chloride: 106 mmol/L (ref 98–111)
Creatinine, Ser: 0.74 mg/dL (ref 0.44–1.00)
GFR, Estimated: >60 mL/min (ref >60)
Glucose, Bld: 112 mg/dL — ABNORMAL HIGH (ref 70–99)
Potassium: 3.1 mmol/L — ABNORMAL LOW (ref 3.5–5.1)
Sodium: 138 mmol/L (ref 135–145)

## 2023-10-18 LAB — CBC WITH DIFFERENTIAL/PLATELET
Abs Immature Granulocytes: 0.02 10*3/uL (ref 0.00–0.07)
Basophils Absolute: 0 10*3/uL (ref 0.0–0.1)
Basophils Relative: 0 %
Eosinophils Absolute: 0.2 10*3/uL (ref 0.0–0.5)
Eosinophils Relative: 2 %
HCT: 32.2 % — ABNORMAL LOW (ref 36.0–46.0)
Hemoglobin: 11.6 g/dL — ABNORMAL LOW (ref 12.0–15.0)
Immature Granulocytes: 0 %
Lymphocytes Relative: 9 %
Lymphs Abs: 0.6 10*3/uL — ABNORMAL LOW (ref 0.7–4.0)
MCH: 31 pg (ref 26.0–34.0)
MCHC: 36 g/dL (ref 30.0–36.0)
MCV: 86.1 fL (ref 80.0–100.0)
Monocytes Absolute: 0.6 10*3/uL (ref 0.1–1.0)
Monocytes Relative: 8 %
Neutro Abs: 6 10*3/uL (ref 1.7–7.7)
Neutrophils Relative %: 81 %
Platelets: 302 10*3/uL (ref 150–400)
RBC: 3.74 MIL/uL — ABNORMAL LOW (ref 3.87–5.11)
RDW: 12.2 % (ref 11.5–15.5)
WBC: 7.5 10*3/uL (ref 4.0–10.5)
nRBC: 0 % (ref 0.0–0.2)

## 2023-10-18 LAB — HCG, QUANTITATIVE, PREGNANCY: hCG, Beta Chain, Quant, S: <1 m[IU]/mL (ref <5)

## 2023-10-18 MED ORDER — PANTOPRAZOLE SODIUM 20 MG PO TBEC
20.0000 mg | DELAYED_RELEASE_TABLET | Freq: Every day | ORAL | Status: DC
  Administered 2023-10-19 – 2023-10-21 (×3): 20 mg via ORAL
  Filled 2023-10-18 (×3): qty 1

## 2023-10-18 MED ORDER — ALPRAZOLAM 1 MG PO TABS: 0.5000 mg | ORAL_TABLET | Freq: Two times a day (BID) | ORAL | Status: DC | PRN

## 2023-10-18 MED ORDER — ENOXAPARIN SODIUM 40 MG/0.4ML IJ SOSY
40.0000 mg | PREFILLED_SYRINGE | INTRAMUSCULAR | Status: DC
  Administered 2023-10-19: 40 mg via SUBCUTANEOUS
  Filled 2023-10-18 (×2): qty 0.4

## 2023-10-18 MED ORDER — MORPHINE SULFATE (PF) 2 MG/ML IV SOLN
2.0000 mg | INTRAVENOUS | Status: DC | PRN
  Administered 2023-10-19 (×5): 2 mg via INTRAVENOUS
  Filled 2023-10-18 (×6): qty 1

## 2023-10-18 MED ORDER — KETOROLAC TROMETHAMINE 15 MG/ML IJ SOLN
15.0000 mg | Freq: Once | INTRAMUSCULAR | Status: AC
  Administered 2023-10-18: 15 mg via INTRAVENOUS
  Filled 2023-10-18: qty 1

## 2023-10-18 MED ORDER — ACETAMINOPHEN 650 MG RE SUPP: 650.0000 mg | Freq: Four times a day (QID) | RECTAL | Status: DC | PRN

## 2023-10-18 MED ORDER — PRENATAL MULTIVITAMIN CH
1.0000 | ORAL_TABLET | Freq: Every day | ORAL | Status: DC
  Administered 2023-10-19 – 2023-10-21 (×3): 1 via ORAL
  Filled 2023-10-18 (×3): qty 1

## 2023-10-18 MED ORDER — FE FUM-VIT C-VIT B12-FA 460-60-0.01-1 MG PO CAPS
1.0000 | ORAL_CAPSULE | Freq: Every day | ORAL | Status: DC
  Filled 2023-10-18 (×3): qty 1

## 2023-10-18 MED ORDER — FUSION PLUS PO CAPS: 1.0000 | ORAL_CAPSULE | Freq: Every day | ORAL | Status: DC

## 2023-10-18 MED ORDER — SODIUM CHLORIDE 0.9 % IV BOLUS
1000.0000 mL | Freq: Once | INTRAVENOUS | Status: AC
  Administered 2023-10-18: 1000 mL via INTRAVENOUS

## 2023-10-18 MED ORDER — MAGNESIUM HYDROXIDE 400 MG/5ML PO SUSP: 30.0000 mL | Freq: Every day | ORAL | Status: DC | PRN

## 2023-10-18 MED ORDER — MORPHINE SULFATE (PF) 4 MG/ML IV SOLN
4.0000 mg | Freq: Once | INTRAVENOUS | Status: AC
  Administered 2023-10-18: 4 mg via INTRAVENOUS
  Filled 2023-10-18: qty 1

## 2023-10-18 MED ORDER — ONDANSETRON HCL 4 MG PO TABS: 4.0000 mg | ORAL_TABLET | Freq: Four times a day (QID) | ORAL | Status: DC | PRN

## 2023-10-18 MED ORDER — CITALOPRAM HYDROBROMIDE 20 MG PO TABS
20.0000 mg | ORAL_TABLET | Freq: Every day | ORAL | Status: DC
  Administered 2023-10-19 – 2023-10-21 (×3): 20 mg via ORAL
  Filled 2023-10-18 (×3): qty 1

## 2023-10-18 MED ORDER — TRAZODONE HCL 50 MG PO TABS: 25.0000 mg | ORAL_TABLET | Freq: Every evening | ORAL | Status: DC | PRN

## 2023-10-18 MED ORDER — LACTATED RINGERS IV SOLN
150.0000 mL/h | INTRAVENOUS | Status: AC
  Administered 2023-10-19 (×3): 150 mL/h via INTRAVENOUS

## 2023-10-18 MED ORDER — SODIUM CHLORIDE 0.9 % IV SOLN
1.0000 g | Freq: Once | INTRAVENOUS | Status: AC
  Administered 2023-10-18: 1 g via INTRAVENOUS
  Filled 2023-10-18: qty 10

## 2023-10-18 MED ORDER — ONDANSETRON HCL 4 MG/2ML IJ SOLN: 4.0000 mg | Freq: Four times a day (QID) | INTRAMUSCULAR | Status: DC | PRN

## 2023-10-18 MED ORDER — ACETAMINOPHEN 325 MG PO TABS
650.0000 mg | ORAL_TABLET | Freq: Four times a day (QID) | ORAL | Status: DC | PRN
  Administered 2023-10-19 – 2023-10-20 (×5): 650 mg via ORAL
  Filled 2023-10-18 (×6): qty 2

## 2023-10-18 MED ORDER — CEFAZOLIN SODIUM-DEXTROSE 2-4 GM/100ML-% IV SOLN
2.0000 g | Freq: Three times a day (TID) | INTRAVENOUS | Status: DC
  Administered 2023-10-19 – 2023-10-21 (×7): 2 g via INTRAVENOUS
  Filled 2023-10-18 (×8): qty 100

## 2023-10-18 NOTE — ED Notes (Signed)
 Pt to room 16 from triage.

## 2023-10-18 NOTE — ED Notes (Signed)
Pt given breastpump at this time.

## 2023-10-18 NOTE — ED Triage Notes (Signed)
 Pt to triage via wheelchair.  Pt has a fever and mastitis.  Vomited x 1.  Denies abd pain.   Pt is 15 weeks postpartum.   Face flushed.

## 2023-10-18 NOTE — ED Provider Notes (Signed)
 [facility] Provider Note    Event Date/Time   First MD Initiated Contact with Patient 10/18/23 2125     (approximate)   History   Fever   HPI ARACELYS GLADE is a 33 y.o. female presenting today for bilateral breast pain.  Patient states she had onset of pain on her right breast approximately 1 week ago and was started on dixloxacillin for mastitis.  She did have improvement in symptoms initially but in the past couple days has had worsening of symptoms.  It now involves bilateral breasts associated with fevers at home and headaches with the fevers.  She has not noted any drainage from either breast but they are red and warm.  Denies abdominal pain, nausea, vomiting, vision changes, numbness, weakness, cough, congestion, shortness of breath, chest pain otherwise.     Physical Exam   Triage Vital Signs: ED Triage Vitals  Encounter Vitals Group     BP 10/18/23 2110 117/78     Systolic BP Percentile --      Diastolic BP Percentile --      Pulse Rate 10/18/23 2110 (!) 153     Resp 10/18/23 2110 (!) 24     Temp 10/18/23 2110 99.1 F (37.3 C)     Temp Source 10/18/23 2110 Oral     SpO2 10/18/23 2110 97 %     Weight 10/18/23 2111 170 lb (77.1 kg)     Height 10/18/23 2111 5\' 10"  (1.778 m)     Head Circumference --      Peak Flow --      Pain Score 10/18/23 2111 8     Pain Loc --      Pain Education --      Exclude from Growth Chart --     Most recent vital signs: Vitals:   10/18/23 2110 10/18/23 2117  BP: 117/78 119/64  Pulse: (!) 153 (!) 134  Resp: (!) 24 18  Temp: 99.1 F (37.3 C) 99 F (37.2 C)  SpO2: 97% 99%   Physical Exam: I have reviewed the vital signs and nursing notes. General: Awake, alert, no acute distress.  Nontoxic appearing. Head:  Atraumatic, normocephalic.   ENT:  EOM intact, PERRL. Oral mucosa is pink and moist with no lesions. Neck: Neck is supple with full range of motion, No meningeal signs. Cardiovascular:  RRR,  No murmurs. Peripheral pulses palpable and equal bilaterally. Respiratory:  Symmetrical chest wall expansion.  No rhonchi, rales, or wheezes.  Good air movement throughout.  No use of accessory muscles.   Musculoskeletal:  No cyanosis or edema. Moving extremities with full ROM Abdomen:  Soft, nontender, nondistended. Neuro:  GCS 15, moving all four extremities, interacting appropriately. Speech clear. Psych:  Calm, appropriate.   Skin: Bilateral breast appear warm, erythematous, and tender to palpation.  No obvious other fluctuance noted on palpation to suggest abscess at this time   ED Results / Procedures / Treatments   Labs (all labs ordered are listed, but only abnormal results are displayed) Labs Reviewed  CBC WITH DIFFERENTIAL/PLATELET - Abnormal; Notable for the following components:      Result Value   RBC 3.74 (*)    Hemoglobin 11.6 (*)    HCT 32.2 (*)    Lymphs Abs 0.6 (*)    All other components within normal limits  BASIC METABOLIC PANEL WITH GFR - Abnormal; Notable for the following components:   Potassium 3.1 (*)    Glucose, Bld 112 (*)  Calcium  8.5 (*)    All other components within normal limits  HCG, QUANTITATIVE, PREGNANCY     EKG    RADIOLOGY    PROCEDURES:  Critical Care performed: No  Procedures   MEDICATIONS ORDERED IN ED: Medications  sodium chloride  0.9 % bolus 1,000 mL (has no administration in time range)  cefTRIAXone (ROCEPHIN) 1 g in sodium chloride  0.9 % 100 mL IVPB (0 g Intravenous Stopped 10/18/23 2221)  ketorolac  (TORADOL ) 15 MG/ML injection 15 mg (15 mg Intravenous Given 10/18/23 2158)  morphine (PF) 4 MG/ML injection 4 mg (4 mg Intravenous Given 10/18/23 2218)     IMPRESSION / MDM / ASSESSMENT AND PLAN / ED COURSE  I reviewed the triage vital signs and the nursing notes.                              Differential diagnosis includes, but is not limited to, bilateral mastitis, cellulitis  Patient's presentation is most consistent  with acute complicated illness / injury requiring diagnostic workup.  Patient is a 33 year old female presenting today for bilateral breast pain with fevers at home.  On arrival she is tachycardic but not hypotensive or severely tachypneic.  No hypoxia.  Exam does show bilateral breast tenderness with erythema and warmth concerning for bilateral mastitis.  No significant drainage.  Do not palpate any obvious fluctuance to suggest abscess at this time.  Will obtain laboratory workup and give Toradol  and ceftriaxone for symptomatic treatment and antibiotics given potential failure of oral dicloxacillin  in an outpatient setting.  No leukocytosis at this time.  Mild hypokalemia elsewise blood work reassuring.  Given worsening pain symptoms requiring no morphine, will admit for bilateral mastitis with failure on outpatient antibiotics.  The patient is on the cardiac monitor to evaluate for evidence of arrhythmia and/or significant heart rate changes. Clinical Course as of 10/18/23 2241  Mon Oct 18, 2023  2241 Patient requiring additional pain medicine following Toradol .  Was given morphine with some improvement. [DW]    Clinical Course User Index [DW] Kandee Orion, MD     FINAL CLINICAL IMPRESSION(S) / ED DIAGNOSES   Final diagnoses:  Granulomatous mastitis, bilateral breast  Cellulitis of chest wall     Rx / DC Orders   ED Discharge Orders     None        Note:  This document was prepared using Dragon voice recognition software and may include unintentional dictation errors.   Kandee Orion, MD 10/18/23 2242

## 2023-10-19 DIAGNOSIS — L0390 Cellulitis, unspecified: Secondary | ICD-10-CM | POA: Diagnosis not present

## 2023-10-19 DIAGNOSIS — E876 Hypokalemia: Secondary | ICD-10-CM

## 2023-10-19 DIAGNOSIS — F419 Anxiety disorder, unspecified: Secondary | ICD-10-CM

## 2023-10-19 DIAGNOSIS — K219 Gastro-esophageal reflux disease without esophagitis: Secondary | ICD-10-CM | POA: Diagnosis not present

## 2023-10-19 LAB — CBC
HCT: 29.3 % — ABNORMAL LOW (ref 36.0–46.0)
Hemoglobin: 9.9 g/dL — ABNORMAL LOW (ref 12.0–15.0)
MCH: 30 pg (ref 26.0–34.0)
MCHC: 33.8 g/dL (ref 30.0–36.0)
MCV: 88.8 fL (ref 80.0–100.0)
Platelets: 302 10*3/uL (ref 150–400)
RBC: 3.3 MIL/uL — ABNORMAL LOW (ref 3.87–5.11)
RDW: 12.1 % (ref 11.5–15.5)
WBC: 11.5 10*3/uL — ABNORMAL HIGH (ref 4.0–10.5)
nRBC: 0 % (ref 0.0–0.2)

## 2023-10-19 LAB — BASIC METABOLIC PANEL WITH GFR
Anion gap: 8 (ref 5–15)
BUN: 12 mg/dL (ref 6–20)
CO2: 26 mmol/L (ref 22–32)
Calcium: 8 mg/dL — ABNORMAL LOW (ref 8.9–10.3)
Chloride: 106 mmol/L (ref 98–111)
Creatinine, Ser: 0.59 mg/dL (ref 0.44–1.00)
GFR, Estimated: >60 mL/min (ref >60)
Glucose, Bld: 110 mg/dL — ABNORMAL HIGH (ref 70–99)
Potassium: 3.6 mmol/L (ref 3.5–5.1)
Sodium: 140 mmol/L (ref 135–145)

## 2023-10-19 LAB — PROTIME-INR
INR: 1.2 (ref 0.8–1.2)
Prothrombin Time: 15.1 s (ref 11.4–15.2)

## 2023-10-19 LAB — HIV ANTIBODY (ROUTINE TESTING W REFLEX): HIV Screen 4th Generation wRfx: NONREACTIVE

## 2023-10-19 LAB — CORTISOL-AM, BLOOD: Cortisol - AM: 3.2 ug/dL — ABNORMAL LOW (ref 6.7–22.6)

## 2023-10-19 LAB — LACTIC ACID, PLASMA: Lactic Acid, Venous: 0.9 mmol/L (ref 0.5–1.9)

## 2023-10-19 MED ORDER — SODIUM CHLORIDE 0.9 % IV SOLN: INTRAVENOUS | Status: AC | PRN

## 2023-10-19 NOTE — Lactation Note (Addendum)
 Lactation Consultation Note  Patient Name: Danielle Wood [id number] Date: 10/19/2023 Age:33 y.o. Reason for consult: Follow-up assessment;Other (Comment);Exclusive pumping and bottle feeding (Mastitis)   Maternal Data This is a 4th time mom. Mom has a history of anxiety. She is exclusively pumping and providing breastmilk via bottle to her baby. Per patient's report she had a consistent pumping routine in which she alternated using her DEBP and her wearable hands free pump. This routine changed when she went on vacation to the beach and began using predominately her wearable hands free breastpump, in which she was pumping every 2 hours during the day. Per patient a week after she had gone away she experienced right breast redness, hardness, and pain. She also experienced fever prior to having the breast changes. She called the [facility] office(see telephone note 10/13/23) and was advised to call her Provider, use ice, gentle massaging of the breast, pump on a gentle cycle due to her report of pain.She reports she was prescribed an antibiotic and was taking antibiotics as directed by her Provider, did not miss any does, and felt some improvement until last night when out with family she describe having headache, fever, and pain in her left breast in addition to her right breast with the left breast being worse than her right breast.  Tonight patient's right breast is softening and no redness of the right breast is noted. She reports improvement more in her right breast than her left. Her left breast has visible redness from approximately 9 o'clock until 12 o'clock expanding from just outside the areola toward the inner aspect of her breast. Her left breast is tense, per patient this is an improvement. She is using ice packs to both breasts for at least 20 minutes with every pump session and is pumping with the hospital grade DEBP at bedside. She has been recommended to hold off using her hands free wearables as  they may not completely empty the breasts and to use the hospital grade double electric pump which does a better job at fully emptying the breasts. Patient reports she is also applying some coconut oil to her left nipple provided by care nurse as she has a healing crack on her left nipple. Per patient she continues to have a headache in which she is taking pain medication.  Does the patient have breastfeeding experience prior to this delivery?: Yes How long did the patient breastfeed?: Max of 10 months per initial consult  Feeding Mother's Current Feeding Choice: Breast Milk and Formula (Mom's goal is to pump and provide milk, however baby is also receiving some formula as needed.)  She reports she is currently not using today's pumped milk as the milk has "large chunks" in it that she doesn't think can pass through a nipple. Bottle containing recently pumped milk at bedside appear to have thick coagulated globules of milk. She is not comfortable using this milk.    Lactation Tools Discussed/Used Tools: Pump Breast pump type: Double-Electric Breast Pump Reason for Pumping: mom exclusively pumps and provides milk Pumping frequency: Mom is pumping every 2-3 hours. Pumped volume:  (Mom's volume has decreased with mastitis.) Encouraged patient to use no more than her maximum comfort vacuum and to follow her body for selection of suction. Mom can do gentle massage again following her body's comfort level.Recommended she continue to apply ice packs after each pump session and recline when using ice packs. She understands she can also apply ice packs prior to pumping as well. Interventions  Interventions: DEBP;Ice;Education  Discharge Pump: Personal;Hands Free and DEBP  Consult Status Consult Status: Follow-up Date: 10/20/23 Follow-up type: In-patient  Update provided to care nurse.  Angelica Kemp 10/19/2023, 9:29 PM

## 2023-10-19 NOTE — Progress Notes (Signed)
 Cardiac monitoring unavailable due to not having a tele box per [facility]. MD made aware and is okay with discontinuing cardiac monitoring since patients vitals are stable. Will continue to monitor.

## 2023-10-19 NOTE — Progress Notes (Signed)
  Progress Note   Patient: Danielle Wood ZOX:[id number] DOB: 1991-02-22 DOA: 10/18/2023     1 DOS: the patient was seen and examined on 10/19/2023   Brief hospital course: Taken from H&P.  Danielle Wood is a 33 y.o. Caucasian female with medical history significant for anxiety, nursing with a 27-month-old baby who presented to the emergency room with acute onset of right breast pain that started about a week ago for which she was given p.o. doxycycline on outpatient basis with mild initial improvement of her symptoms.  Later symptoms got worse and developed fever and headaches.  She denies any drainage from her breast.  There was associated erythema, warmth and tenderness.  On presentation patient was febrile with significant tachycardia at 153, respiratory rate 24.  Labs with hypokalemia at 3.1.  Patient received a dose of morphine, Toradol  and started on Rocephin for concern of mastitis.  6/3: Vital stable, labs with leukocytosis at 11.5, hemoglobin 9.9, hypokalemia resolved with potassium at 3.6.  Worsening tenderness on left, normal discharge are obvious abscess.  Assessment and Plan: * Sepsis due to cellulitis [facility]) - This is secondary to bilateral acute mastitis. - Sepsis is manifested by tachycardia and tachypnea. - She may be having pending abscess given her throbbing pain however does not have palpable fluctuation. -Continue with cefazolin - Supportive care with warm compresses, gentle pumping breastmilk and pain management.  Hypokalemia Resolved with repletion -Continue to monitor and replete as needed  Anxiety and depression - Continue Xanax and Celexa .  GERD without esophagitis - Will continue PPI therapy.   Subjective: Patient was having more pain on left breast, right breast seems improving.  Physical Exam: Vitals:   10/19/23 0015 10/19/23 0300 10/19/23 0710 10/19/23 1250  BP: 103/68 105/63 103/73 108/70  Pulse: (!) 103 (!) 119 83 75  Resp:  20 19 18    Temp:  98.8 F (37.1 C) 98.4 F (36.9 C) 98.3 F (36.8 C)  TempSrc:  Oral Oral Oral  SpO2:  98% 98% 98%  Weight:      Height:       General.  Will develop lady, in no acute distress. Tender and warm left breast, no obvious fluctuation, no nipple discharge. Pulmonary.  Lungs clear bilaterally, normal respiratory effort. CV.  Regular rate and rhythm, no JVD, rub or murmur. Abdomen.  Soft, nontender, nondistended, BS positive. CNS.  Alert and oriented .  No focal neurologic deficit. Extremities.  No edema, no cyanosis, pulses intact and symmetrical. Psychiatry.  Judgment and insight appears normal.   Data Reviewed: Prior data reviewed  Family Communication: Discussed with husband at bedside  Disposition: Status is: Inpatient Remains inpatient appropriate because: Severity of illness  Planned Discharge Destination: Home  DVT prophylaxis.  Lovenox Time spent: 50 minutes  This record has been created using Conservation officer, historic buildings. Errors have been sought and corrected,but may not always be located. Such creation errors do not reflect on the standard of care.   Author: Luna Salinas, MD 10/19/2023 3:32 PM  For on call review [email or web address].

## 2023-10-19 NOTE — H&P (Addendum)
 [place]   PATIENT NAME: Danielle Wood    MR#:  [id number]  DATE OF BIRTH:  April 15, 1991  DATE OF ADMISSION:  10/18/2023  PRIMARY CARE PHYSICIAN: Alise Appl, CNM   Patient is coming from: Home  REQUESTING/REFERRING PHYSICIAN: Daisy Du, MD  CHIEF COMPLAINT:   Chief Complaint  Patient presents with   Fever    HISTORY OF PRESENT ILLNESS:  Danielle Wood is a 33 y.o. Caucasian female with medical history significant for anxiety, who presented to the emergency room with acute onset of right breast pain that started about a week ago for which she was given p.o. doxycycline on outpatient basis with mild initial improvement of her symptoms.  Her pain has been throbbing occasionally.  During the last couple of days however she experienced worsening symptoms and it involved both breasts with associated fever with a Tmax of 104 at home and headaches.  She denied any drainage from her breasts.  She has been having associated erythema, warmth and tenderness.  She vomited once during the day but denied abdominal pain.  No chest pain or palpitations.  No cough or wheezing or dyspnea.  No dysuria, oliguria or hematuria or flank pain.  No bleeding diathesis.  ED Course: When she came to the ER, temperature was 99.1 and heart rate 153 and later 134, respiratory rate of 24 and pulse continues 97% on room air.  Labs revealed hypokalemia of 3.1 and CBC showed mild anemia better than previous levels.  Lactic acid was 0.9.  Serum pregnancy test was negative. EKG as reviewed by me : EKG showed sinus tachycardia with a rate of 125. Imaging: None.  The patient was given 50 mg of IV Toradol , 4 mg of IV morphine sulfate, 1 g of IV Rocephin and 1 L bolus of IV normal saline.  She will be admitted to a medical/surgical bed for further evaluation and management. PAST MEDICAL HISTORY:   Past Medical History:  Diagnosis Date   Anxiety     PAST SURGICAL HISTORY:   Past Surgical History:   Procedure Laterality Date   TONSILLECTOMY AND ADENOIDECTOMY N/A 01/14/2016   Procedure: TONSILLECTOMY AND ADENOIDECTOMY;  Surgeon: Lesly Raspberry, MD;  Location: [facility] ORS;  Service: ENT;  Laterality: N/A;   WISDOM TOOTH EXTRACTION  2010    SOCIAL HISTORY:   Social History   Tobacco Use   Smoking status: Never   Smokeless tobacco: Never  Substance Use Topics   Alcohol use: No    FAMILY HISTORY:   Family History  Problem Relation Age of Onset   Healthy Mother    Hypertension Father    Atrial fibrillation Father    Down syndrome Brother        died day after birth   Healthy Brother    COPD Maternal Grandmother    Atrial fibrillation Maternal Grandmother    Supraventricular tachycardia Maternal Grandmother    Heart failure Maternal Grandfather    Cancer Paternal Grandmother 63       breast/matastasized   Breast cancer Paternal Grandmother    Healthy Paternal Grandfather     DRUG ALLERGIES:   Allergies  Allergen Reactions   Sulfamethoxazole -Trimethoprim  Other (See Comments)    Fever/chills/flu like symptoms    REVIEW OF SYSTEMS:   ROS As per history of present illness. All pertinent systems were reviewed above. Constitutional, HEENT, cardiovascular, respiratory, GI, GU, musculoskeletal, neuro, psychiatric, endocrine, integumentary and hematologic systems were reviewed and are otherwise negative/unremarkable except for positive  findings mentioned above in the HPI.   MEDICATIONS AT HOME:   Prior to Admission medications   Medication Sig Start Date End Date Taking? Authorizing Provider  acetaminophen  (TYLENOL ) 325 MG tablet Take 2 tablets (650 mg total) by mouth every 4 (four) hours as needed (for pain scale < 4). 12/14/21  Yes Dominic, Alva Jewels, CNM  ALPRAZolam (XANAX) 0.5 MG tablet Take 0.5 mg by mouth 2 (two) times daily as needed for anxiety.   Yes [provider]  citalopram  (CELEXA ) 20 MG tablet Take 1 tablet (20 mg total) by mouth daily. Patient  taking differently: Take 20 mg by mouth daily. 01/30/23 10/18/23 Yes Alise Appl, CNM  citalopram  (CELEXA ) 20 MG tablet Take 20 mg by mouth daily.   Yes [provider]  dicloxacillin  (DYNAPEN ) 500 MG capsule Take 1 capsule (500 mg total) by mouth 4 (four) times daily. 10/13/23  Yes Sofia Dunn, MD  pantoprazole  (PROTONIX ) 20 MG tablet Take 1 tablet by mouth once daily 08/20/23  Yes Forestine Igo, CNM  Iron-FA-B Cmp-C-Biot-Probiotic (FUSION PLUS) CAPS Take 1 capsule by mouth daily at 6 (six) AM. 01/05/23   Alise Appl, CNM  ondansetron  (ZOFRAN ) 4 MG tablet Take 1 tablet (4 mg total) by mouth daily as needed for nausea or vomiting. 06/03/23 06/02/24  Alise Appl, CNM  Prenatal Vit-Fe Fumarate-FA (PRENATAL MULTIVITAMIN) TABS tablet Take 1 tablet by mouth daily at 12 noon.    [provider]      VITAL SIGNS:  Blood pressure 105/63, pulse (!) 119, temperature 98.8 F (37.1 C), temperature source Oral, resp. rate 20, height 5\' 10"  (1.778 m), weight 77.1 kg, SpO2 98%, currently breastfeeding.  PHYSICAL EXAMINATION:  Physical Exam  GENERAL:  33 y.o.-year-old Caucasian female patient lying in the bed with no acute distress.  EYES: Pupils equal, round, reactive to light and accommodation. No scleral icterus. Extraocular muscles intact.  HEENT: Head atraumatic, normocephalic. Oropharynx and nasopharynx clear.  NECK:  Supple, no jugular venous distention. No thyroid enlargement, no tenderness.  LUNGS: Normal breath sounds bilaterally, no wheezing, rales,rhonchi or crepitation. No use of accessory muscles of respiration.  CARDIOVASCULAR: Regular rate and rhythm, S1, S2 normal. No murmurs, rubs, or gallops.  ABDOMEN: Soft, nondistended, nontender. Bowel sounds present. No organomegaly or mass.  EXTREMITIES: No pedal edema, cyanosis, or clubbing.  NEUROLOGIC: Cranial nerves II through XII are intact. Muscle strength 5/5 in all extremities. Sensation intact. Gait not checked.   PSYCHIATRIC: The patient is alert and oriented x 3.  Normal affect and good eye contact. SKIN: Bilateral warm, erythematous and tender breasts with no fluctuation.  LABORATORY PANEL:   CBC Recent Labs  Lab 10/18/23 2134  WBC 7.5  HGB 11.6*  HCT 32.2*  PLT 302   ------------------------------------------------------------------------------------------------------------------  Chemistries  Recent Labs  Lab 10/18/23 2134  NA 138  K 3.1*  CL 106  CO2 22  GLUCOSE 112*  BUN 11  CREATININE 0.74  CALCIUM  8.5*   ------------------------------------------------------------------------------------------------------------------  Cardiac Enzymes No results for input(s): "TROPONINI" in the last 168 hours. ------------------------------------------------------------------------------------------------------------------  RADIOLOGY:  No results found.    IMPRESSION AND PLAN:  Assessment and Plan: * Sepsis due to cellulitis [facility]) - This is secondary to bilateral acute mastitis. - The patient will be admitted to a medical-surgical bed. - Sepsis is manifested by tachycardia and tachypnea. - She may be having pending abscess given her throbbing pain however does not have palpable fluctuation. - Will continue antibiotic therapy with IV Ancef. - Will apply  warm compresses. - Pain management will be provided. - Will follow blood cultures.  Hypokalemia - We will Replace potassium and check magnesium level.  Anxiety and depression - Continue Xanax and Celexa .  GERD without esophagitis - Will continue PPI therapy.   DVT prophylaxis: Lovenox.  Advanced Care Planning:  Code Status: full code.  Family Communication:  The plan of care was discussed in details with the patient (and family). I answered all questions. The patient agreed to proceed with the above mentioned plan. Further management will depend upon hospital course. Disposition Plan: Back to previous home  environment Consults called: none.  All the records are reviewed and case discussed with ED provider.  Status is: Inpatient  At the time of the admission, it appears that the appropriate admission status for this patient is inpatient.  This is judged to be reasonable and necessary in order to provide the required intensity of service to ensure the patient's safety given the presenting symptoms, physical exam findings and initial radiographic and laboratory data in the context of comorbid conditions.  The patient requires inpatient status due to high intensity of service, high risk of further deterioration and high frequency of surveillance required.  I certify that at the time of admission, it is my clinical judgment that the patient will require inpatient hospital care extending more than 2 midnights.                            Dispo: The patient is from: Home              Anticipated d/c is to: Home              Patient currently is not medically stable to d/c.              Difficult to place patient: No  Virgene Griffin M.D on 10/19/2023 at 5:59 AM  [facility]   From 7 PM-7 AM, contact night-coverage [email or web address]  CC: Primary care physician; Alise Appl, CNM

## 2023-10-19 NOTE — Hospital Course (Addendum)
 Taken from H&P.  Danielle Wood is a 33 y.o. Caucasian female with medical history significant for anxiety, nursing with a 77-month-old baby who presented to the emergency room with acute onset of right breast pain that started about a week ago for which she was given p.o. doxycycline on outpatient basis with mild initial improvement of her symptoms.  Later symptoms got worse and developed fever and headaches.  She denies any drainage from her breast.  There was associated erythema, warmth and tenderness.  On presentation patient was febrile with significant tachycardia at 153, respiratory rate 24.  Labs with hypokalemia at 3.1.  Patient received a dose of morphine, Toradol  and started on Rocephin for concern of mastitis.  6/3: Vital stable, labs with leukocytosis at 11.5, hemoglobin 9.9, hypokalemia resolved with potassium at 3.6.  Worsening tenderness on left, normal discharge are obvious abscess.

## 2023-10-19 NOTE — Assessment & Plan Note (Signed)
-  Continue Xanax and Celexa

## 2023-10-19 NOTE — Assessment & Plan Note (Signed)
 Will continue PPI therapy.

## 2023-10-19 NOTE — Assessment & Plan Note (Addendum)
-   This is secondary to bilateral acute mastitis. - Sepsis is manifested by tachycardia and tachypnea. - She may be having pending abscess given her throbbing pain however does not have palpable fluctuation. -Continue with cefazolin - Supportive care with warm compresses, gentle pumping breastmilk and pain management.

## 2023-10-19 NOTE — Assessment & Plan Note (Addendum)
Resolved with repletion. -Continue to monitor and replete as needed

## 2023-10-20 DIAGNOSIS — A419 Sepsis, unspecified organism: Secondary | ICD-10-CM | POA: Diagnosis not present

## 2023-10-20 DIAGNOSIS — L0390 Cellulitis, unspecified: Secondary | ICD-10-CM | POA: Diagnosis not present

## 2023-10-20 MED ORDER — COSYNTROPIN 0.25 MG IJ SOLR
0.2500 mg | Freq: Once | INTRAMUSCULAR | Status: AC
  Administered 2023-10-21: 0.25 mg via INTRAVENOUS
  Filled 2023-10-20 (×2): qty 0.25

## 2023-10-20 MED ORDER — IBUPROFEN 600 MG PO TABS: 600.0000 mg | ORAL_TABLET | Freq: Once | ORAL | Status: DC

## 2023-10-20 NOTE — Progress Notes (Addendum)
 Progress Note    Danielle Wood  ZOX:[id number] DOB: 1990/08/29  DOA: 10/18/2023 PCP: Alise Appl, CNM      Brief Narrative:    Medical records reviewed and are as summarized below:   Danielle Wood is a 33 y.o. Caucasian female with medical history significant for anxiety, nursing with a 1-month-old baby who presented to the emergency room with acute onset of right breast pain that started about a week prior to admission for which she was given p.o. doxycycline on outpatient basis.  Right breast pain and swelling improved but then she developed worsening pain and swelling in her left breast with some drainage.  It was associated with warmth tenderness and erythema..  She also developed fever and headaches.  On presentation patient was febrile with significant tachycardia at 153, respiratory rate 24.  Labs with hypokalemia at 3.1.  She was treated with IV ceftriaxone (later transitioned to IV Ancef) and analgesics.      Assessment/Plan:   Principal Problem:   Sepsis due to cellulitis [facility]) Active Problems:   Anxiety and depression   Hypokalemia   GERD without esophagitis    Body mass index is 24.39 kg/m.   Sepsis secondary to lactation mastitis (bilateral), fever: She had a fever last night with temperature 101 F.  WBC went up from 7.5-11.5.  Continue IV Ancef.  Analgesics as needed for pain.  Continue to express breastmilk as able.   Hypokalemia: Improved   Low cortisol level: Cortisol level was 3.2.  Check ACTH stimulation test.   Comorbidities include anxiety, depression, GERD   Plan of care was discussed with patient and her husband at the bedside.  Winnie Haver, RN, were at the bedside.  Patient requested to see me again.  I went back to see her in the afternoon.  I answered all her questions. Ramiro Burly, RN, was at the bedside.  Diet Order             Diet Heart Room service appropriate? Yes; Fluid consistency: Thin  Diet effective now                             Consultants: None  Procedures: None    Medications:    citalopram   20 mg Oral Daily   [START ON 10/21/2023] cosyntropin  0.25 mg Intravenous Once   enoxaparin (LOVENOX) injection  40 mg Subcutaneous Q24H   Fe Fum-Vit C-Vit B12-FA  1 capsule Oral QPC breakfast   pantoprazole   20 mg Oral Daily   prenatal multivitamin  1 tablet Oral Q1200   Continuous Infusions:  sodium chloride  10 mL/hr at 10/20/23 0624    ceFAZolin (ANCEF) IV 2 g (10/20/23 0625)     Anti-infectives (From admission, onward)    Start     Dose/Rate Route Frequency Ordered Stop   10/19/23 0600  ceFAZolin (ANCEF) IVPB 2g/100 mL premix        2 g 200 mL/hr over 30 Minutes Intravenous Every 8 hours 10/18/23 2318 10/26/23 0559   10/18/23 2200  cefTRIAXone (ROCEPHIN) 1 g in sodium chloride  0.9 % 100 mL IVPB        1 g 200 mL/hr over 30 Minutes Intravenous  Once 10/18/23 2152 10/18/23 2221              Family Communication/Anticipated D/C date and plan/Code Status   DVT prophylaxis: enoxaparin (LOVENOX) injection 40 mg Start: 10/18/23 2330  Code Status: Full Code  Family Communication: Plan discussed with the husband at the bedside Disposition Plan: Plan to discharge home   Status is: Inpatient Remains inpatient appropriate because: Fever, mastitis with sepsis       Subjective:   Interval events noted.  She had fever last night with temperature of 101 F.  No fever this morning.  Pain in the breast is improving.  Left breast still bigger than the right.  Her husband was at the bedside.  Ramiro Burly, RN and Antony Baumgartner, RN, at bedside  Objective:    Vitals:   10/19/23 2358 10/20/23 0110 10/20/23 0325 10/20/23 0817  BP: 121/79  123/80 116/77  Pulse: 84  71 70  Resp:   16 18  Temp: (!) 101 F (38.3 C) 98.5 F (36.9 C) 98.3 F (36.8 C) 98.6 F (37 C)  TempSrc: Oral Oral Oral Oral  SpO2: 98%  99% 100%  Weight:      Height:       No data  found.   Intake/Output Summary (Last 24 hours) at 10/20/2023 1049 Last data filed at 10/19/2023 2354 Gross per 24 hour  Intake 3497.41 ml  Output --  Net 3497.41 ml   Filed Weights   10/18/23 2111  Weight: 77.1 kg    Exam:  GEN: NAD SKIN: Left breast looks bigger than right. Mild tenderness of left breast but no erythema noted on both breasts EYES: No pallor or icterus ENT: MMM CV: RRR PULM: CTA B ABD: soft, ND, NT, +BS CNS: AAO x 3, non focal EXT: No edema or tenderness    Chaperones: Ramiro Burly, RN and Anna, RN, at bedside      Data Reviewed:   I have personally reviewed following labs and imaging studies:  Labs: Labs show the following:   Basic Metabolic Panel: Recent Labs  Lab 10/18/23 2134 10/19/23 0528  NA 138 140  K 3.1* 3.6  CL 106 106  CO2 22 26  GLUCOSE 112* 110*  BUN 11 12  CREATININE 0.74 0.59  CALCIUM  8.5* 8.0*   GFR Estimated Creatinine Clearance: 108.2 mL/min (by C-G formula based on SCr of 0.59 mg/dL). Liver Function Tests: No results for input(s): "AST", "ALT", "ALKPHOS", "BILITOT", "PROT", "ALBUMIN" in the last 168 hours. No results for input(s): "LIPASE", "AMYLASE" in the last 168 hours. No results for input(s): "AMMONIA " in the last 168 hours. Coagulation profile Recent Labs  Lab 10/19/23 0528  INR 1.2    CBC: Recent Labs  Lab 10/18/23 2134 10/19/23 0528  WBC 7.5 11.5*  NEUTROABS 6.0  --   HGB 11.6* 9.9*  HCT 32.2* 29.3*  MCV 86.1 88.8  PLT 302 302   Cardiac Enzymes: No results for input(s): "CKTOTAL", "CKMB", "CKMBINDEX", "TROPONINI" in the last 168 hours. BNP (last 3 results) No results for input(s): "PROBNP" in the last 8760 hours. CBG: No results for input(s): "GLUCAP" in the last 168 hours. D-Dimer: No results for input(s): "DDIMER" in the last 72 hours. Hgb A1c: No results for input(s): "HGBA1C" in the last 72 hours. Lipid Profile: No results for input(s): "CHOL", "HDL", "LDLCALC", "TRIG", "CHOLHDL",  "LDLDIRECT" in the last 72 hours. Thyroid function studies: No results for input(s): "TSH", "T4TOTAL", "T3FREE", "THYROIDAB" in the last 72 hours.  Invalid input(s): "FREET3" Anemia work up: No results for input(s): "VITAMINB12", "FOLATE", "FERRITIN", "TIBC", "IRON", "RETICCTPCT" in the last 72 hours. Sepsis Labs: Recent Labs  Lab 10/18/23 2134 10/19/23 0017 10/19/23 0528  WBC 7.5  --  11.5*  LATICACIDVEN  --  0.9  --     Microbiology No results found for this or any previous visit (from the past 240 hours).  Procedures and diagnostic studies:  No results found.             LOS: 2 days   Anelly Samarin  [facility]   Pager on [email or web address]. If 7PM-7AM, please contact night-coverage at [email or web address]     10/20/2023, 10:49 AM

## 2023-10-21 DIAGNOSIS — N61 Mastitis without abscess: Secondary | ICD-10-CM | POA: Diagnosis present

## 2023-10-21 DIAGNOSIS — A419 Sepsis, unspecified organism: Secondary | ICD-10-CM | POA: Diagnosis not present

## 2023-10-21 DIAGNOSIS — E2740 Unspecified adrenocortical insufficiency: Secondary | ICD-10-CM | POA: Diagnosis present

## 2023-10-21 DIAGNOSIS — L0390 Cellulitis, unspecified: Secondary | ICD-10-CM | POA: Diagnosis not present

## 2023-10-21 LAB — BASIC METABOLIC PANEL WITH GFR
Anion gap: 7 (ref 5–15)
BUN: 8 mg/dL (ref 6–20)
CO2: 26 mmol/L (ref 22–32)
Calcium: 8.2 mg/dL — ABNORMAL LOW (ref 8.9–10.3)
Chloride: 106 mmol/L (ref 98–111)
Creatinine, Ser: 0.6 mg/dL (ref 0.44–1.00)
GFR, Estimated: >60 mL/min (ref >60)
Glucose, Bld: 103 mg/dL — ABNORMAL HIGH (ref 70–99)
Potassium: 3.7 mmol/L (ref 3.5–5.1)
Sodium: 139 mmol/L (ref 135–145)

## 2023-10-21 LAB — CBC
HCT: 29.8 % — ABNORMAL LOW (ref 36.0–46.0)
Hemoglobin: 10.4 g/dL — ABNORMAL LOW (ref 12.0–15.0)
MCH: 30.5 pg (ref 26.0–34.0)
MCHC: 34.9 g/dL (ref 30.0–36.0)
MCV: 87.4 fL (ref 80.0–100.0)
Platelets: 330 10*3/uL (ref 150–400)
RBC: 3.41 MIL/uL — ABNORMAL LOW (ref 3.87–5.11)
RDW: 11.9 % (ref 11.5–15.5)
WBC: 5.8 10*3/uL (ref 4.0–10.5)
nRBC: 0 % (ref 0.0–0.2)

## 2023-10-21 LAB — ACTH STIMULATION, 3 TIME POINTS
Cortisol, 30 Min: 15.1 ug/dL
Cortisol, 60 Min: 16.8 ug/dL
Cortisol, Base: 10.6 ug/dL

## 2023-10-21 MED ORDER — HYDROCORTISONE 10 MG PO TABS: 10.0000 mg | ORAL_TABLET | Freq: Every morning | ORAL | 0 refills | Status: AC

## 2023-10-21 MED ORDER — CEPHALEXIN 500 MG PO CAPS: 500.0000 mg | ORAL_CAPSULE | Freq: Four times a day (QID) | ORAL | 0 refills | Status: AC

## 2023-10-21 MED ORDER — CEPHALEXIN 500 MG PO CAPS
500.0000 mg | ORAL_CAPSULE | Freq: Four times a day (QID) | ORAL | Status: DC
  Administered 2023-10-21: 500 mg via ORAL
  Filled 2023-10-21 (×2): qty 1

## 2023-10-21 NOTE — Progress Notes (Signed)
 Patient discharged home with family.  Discharge instructions, when to follow up, and medications reviewed with patient.  Patient verbalized understanding. Patient insisting to walk down by herself

## 2023-10-21 NOTE — Discharge Summary (Addendum)
 Physician Discharge Summary   Patient: Danielle Wood MRN: [id number] DOB: [birth year]  Admit date:     10/18/2023  Discharge date: 10/21/23  Discharge Physician: Sheril Dines   PCP: Alise Appl, CNM   Recommendations at discharge:   Follow-up with PCP to establish care for routine health maintenance as soon as possible Follow-up with Dr. Lorelei Rogers, endocrinologist, in 2 weeks Follow-up with your obstetrician as needed  Discharge Diagnoses: Principal Problem:   Sepsis (HCC) Active Problems:   Mastitis, acute   Hypokalemia   Adrenal insufficiency (HCC)   Anxiety and depression   GERD without esophagitis  Resolved Problems:   * No resolved hospital problems. *  Hospital Course:  Danielle Wood is a 33 y.o. Caucasian female with medical history significant for anxiety, nursing with a 54-month-old baby who presented to the emergency room with acute onset of right breast pain that started about a week prior to admission for which she was given p.o. doxycycline on outpatient basis.  Right breast pain and swelling improved but then she developed worsening pain and swelling in her left breast with some drainage.  It was associated with warmth tenderness and erythema..  She also developed fever and headaches.   On presentation patient was febrile with significant tachycardia at 153, respiratory rate 24.  Labs with hypokalemia at 3.1.   She was treated with IV ceftriaxone (later transitioned to IV Ancef) and analgesics.      Assessment and Plan:   Sepsis secondary to lactation mastitis (bilateral), fever: Improved.  Sepsis and fever have resolved.  She will be discharged on cephalexin for 11 more days to complete a 2-week course of treatment.   Continue to express breastmilk as able.     Hypokalemia: Improved     Adrenal insufficiency: Case was discussed with Dr. Lorelei Rogers, endocrinologist.  She recommended 2-week course of hydrocortisone 10 mg daily.  She hopes she can get  patient in to see her in the office in about 2 weeks.   Patient's demographics was faxed to her office.   ACTH stimulation test results as follows:     Latest Reference Range & Units 10/19/23 05:28 10/21/23 07:00  Cortisol, Base ug/dL  71.2  Cortisol, 30 Min ug/dL  45.8  Cortisol, 60 Min ug/dL  09.9  Cortisol - AM 6.7 - 22.6 ug/dL 3.2 (L)   (L): Data is abnormally low     Comorbidities include anxiety, depression, GERD     Her condition has improved and she is deemed stable for discharge to home today. Discharge plan was discussed with the patient and her husband at the bedside.  All her questions were answered.  She has been strongly encouraged to see Dr. Johnney Nam in the office for further management.          Consultants: None Procedures performed: None Disposition: Home Diet recommendation:  Discharge Diet Orders (From admission, onward)     Start     Ordered   10/21/23 0000  Diet - low sodium heart healthy        10/21/23 1217           Cardiac diet DISCHARGE MEDICATION: Allergies as of 10/21/2023       Reactions   Sulfamethoxazole -trimethoprim  Other (See Comments)   Fever/chills/flu like symptoms        Medication List     STOP taking these medications    dicloxacillin  500 MG capsule Commonly known as: DYNAPEN    ondansetron  4 MG tablet Commonly known as: Zofran   prenatal multivitamin Tabs tablet       TAKE these medications    acetaminophen  325 MG tablet Commonly known as: Tylenol  Take 2 tablets (650 mg total) by mouth every 4 (four) hours as needed (for pain scale < 4).   ALPRAZolam 0.5 MG tablet Commonly known as: XANAX Take 0.5 mg by mouth 2 (two) times daily as needed for anxiety.   cephALEXin 500 MG capsule Commonly known as: KEFLEX Take 1 capsule (500 mg total) by mouth 4 (four) times daily for 11 days.   citalopram  20 MG tablet Commonly known as: CeleXA  Take 1 tablet (20 mg total) by mouth daily. What changed: Another  medication with the same name was removed. Continue taking this medication, and follow the directions you see here.   Fusion Plus Caps Take 1 capsule by mouth daily at 6 (six) AM.   hydrocortisone 10 MG tablet Commonly known as: CORTEF Take 1 tablet (10 mg total) by mouth every morning for 14 days.   pantoprazole  20 MG tablet Commonly known as: PROTONIX  Take 1 tablet by mouth once daily        Follow-up Information     Solum, Nicolas Barren, MD. Schedule an appointment as soon as possible for a visit in 2 week(s).   Specialty: Endocrinology Contact information: [address] [facility] [place] [address] [phone]                Discharge Exam: Cleavon Curls Weights   10/18/23 2111  Weight: 77.1 kg   GEN: NAD SKIN: Warm and dry.  Swelling of both breast has decreased, no erythema or tenderness of the breast. EYES: PERRL no icterus ENT: MMM CV: RRR PULM: CTA B ABD: soft, ND, NT, +BS CNS: AAO x 3, non focal EXT: No edema or tenderness    Concha Deed, RN, was at the bedside as a chaperone Patient's husband was also at the bedside.    Condition at discharge: good  The results of significant diagnostics from this hospitalization (including imaging, microbiology, ancillary and laboratory) are listed below for reference.   Imaging Studies: No results found.  Microbiology: Results for orders placed or performed in visit on 06/09/23  Strep Gp B NAA     Status: None   Collection Time: 06/09/23 10:47 AM   Specimen: Vaginal Swab   VR  Result Value Ref Range Status   Strep Gp B NAA Negative Negative Final    Comment: Centers for Disease Control and Prevention (CDC) and American Congress of Obstetricians and Gynecologists (ACOG) guidelines for prevention of perinatal group B streptococcal (GBS) disease specify co-collection of a vaginal and rectal swab specimen to maximize sensitivity of GBS detection. Per the CDC and ACOG, swabbing both the lower vagina  and rectum substantially increases the yield of detection compared with sampling the vagina alone. Penicillin G, ampicillin, or cefazolin are indicated for intrapartum prophylaxis of perinatal GBS colonization. Reflex susceptibility testing should be performed prior to use of clindamycin only on GBS isolates from penicillin-allergic women who are considered a high risk for anaphylaxis. Treatment with vancomycin without additional testing is warranted if resistance to clindamycin is noted.     Labs: CBC: Recent Labs  Lab 10/18/23 2134 10/19/23 0528 10/21/23 0525  WBC 7.5 11.5* 5.8  NEUTROABS 6.0  --   --   HGB 11.6* 9.9* 10.4*  HCT 32.2* 29.3* 29.8*  MCV 86.1 88.8 87.4  PLT 302 302 330   Basic Metabolic Panel: Recent Labs  Lab 10/18/23 2134  10/19/23 0528 10/21/23 0525  NA 138 140 139  K 3.1* 3.6 3.7  CL 106 106 106  CO2 22 26 26   GLUCOSE 112* 110* 103*  BUN 11 12 8   CREATININE 0.74 0.59 0.60  CALCIUM  8.5* 8.0* 8.2*   Liver Function Tests: No results for input(s): "AST", "ALT", "ALKPHOS", "BILITOT", "PROT", "ALBUMIN" in the last 168 hours. CBG: No results for input(s): "GLUCAP" in the last 168 hours.  Discharge time spent: greater than 30 minutes.  Signed: Sheril Dines, MD [facility] 10/21/2023

## 2023-11-16 ENCOUNTER — Encounter: Payer: Self-pay | Admitting: Certified Nurse Midwife

## 2023-11-17 ENCOUNTER — Other Ambulatory Visit: Payer: Self-pay | Admitting: Certified Nurse Midwife

## 2023-11-23 ENCOUNTER — Other Ambulatory Visit: Payer: Self-pay | Admitting: Certified Nurse Midwife

## 2023-11-23 MED ORDER — CITALOPRAM HYDROBROMIDE 20 MG PO TABS: 40.0000 mg | ORAL_TABLET | Freq: Every day | ORAL | 6 refills | Status: AC

## 2023-11-23 MED ORDER — PANTOPRAZOLE SODIUM 20 MG PO TBEC: 20.0000 mg | DELAYED_RELEASE_TABLET | Freq: Every day | ORAL | 3 refills | Status: AC
